# Patient Record
Sex: Female | Born: 1945 | Race: White | Hispanic: No | Marital: Married | Smoking: Former smoker
Health system: Southern US, Community
[De-identification: ages and names within clinical notes are randomized; demographics above are authoritative.]

## PROBLEM LIST (undated history)

## (undated) DIAGNOSIS — E785 Hyperlipidemia, unspecified: Secondary | ICD-10-CM

## (undated) DIAGNOSIS — I10 Essential (primary) hypertension: Secondary | ICD-10-CM

## (undated) DIAGNOSIS — E079 Disorder of thyroid, unspecified: Secondary | ICD-10-CM

## (undated) HISTORY — DX: Disorder of thyroid, unspecified: E07.9

## (undated) HISTORY — DX: Essential (primary) hypertension: I10

## (undated) HISTORY — DX: Hyperlipidemia, unspecified: E78.5

---

## 2000-05-31 ENCOUNTER — Other Ambulatory Visit: Admission: RE | Admit: 2000-05-31 | Discharge: 2000-05-31 | Payer: Self-pay | Admitting: Obstetrics and Gynecology

## 2003-01-12 ENCOUNTER — Other Ambulatory Visit: Admission: RE | Admit: 2003-01-12 | Discharge: 2003-01-12 | Payer: Self-pay | Admitting: Obstetrics and Gynecology

## 2013-08-13 DIAGNOSIS — J301 Allergic rhinitis due to pollen: Secondary | ICD-10-CM | POA: Diagnosis not present

## 2013-08-13 DIAGNOSIS — H9319 Tinnitus, unspecified ear: Secondary | ICD-10-CM | POA: Diagnosis not present

## 2013-09-23 ENCOUNTER — Encounter: Payer: Self-pay | Admitting: Podiatry

## 2013-09-23 ENCOUNTER — Ambulatory Visit: Payer: Medicare Other | Admitting: Podiatry

## 2013-09-23 VITALS — BP 166/82 | HR 66 | Resp 16 | Ht 66.0 in | Wt 184.0 lb

## 2013-09-23 DIAGNOSIS — L600 Ingrowing nail: Secondary | ICD-10-CM | POA: Diagnosis not present

## 2013-09-23 DIAGNOSIS — M79609 Pain in unspecified limb: Secondary | ICD-10-CM

## 2013-09-23 NOTE — Patient Instructions (Signed)

## 2013-09-23 NOTE — Progress Notes (Signed)
   Subjective:    Patient ID: Paige [name], female    DOB: [birth year], 68 y.o.   MRN: [id number]  HPI Comments: Its my big toenail on my left foot. Ive had it for a couple of months. Its getting worse. Enclosed shoes hurt. i have been trying to get the nail out.     Review of Systems  HENT: Positive for sinus pressure.        Ringing in ears  Endocrine: Positive for heat intolerance.  All other systems reviewed and are negative.      Objective:   Physical Exam        Assessment & Plan:

## 2013-09-23 NOTE — Progress Notes (Signed)
Subjective:     Patient ID: Paige [name], female   DOB: 09/17/1945, 68 y.o.   MRN: [id number]  HPI patient points to the left big toe lateral side stating that it has been sore and she cannot get the corner out   Review of Systems  All other systems reviewed and are negative.      Objective:   Physical Exam  Nursing note and vitals reviewed. Constitutional: She is oriented to person, place, and time.  Cardiovascular: Intact distal pulses.   Musculoskeletal: Normal range of motion.  Neurological: She is oriented to person, place, and time.  Skin: Skin is warm.   neurovascular status is intact with no changes in health history and patient's range of motion subtalar midtarsal joint within normal limits. Patient is found to have well-perfused digits and good arch height and I noted on the left hallux lateral border is incurvated and sore      Assessment:      ingrown toenail deformity left hallux lateral border     Plan:     H&P reviewed and today I recommended correction explaining risk. Infiltrated the left hallux 60 mg Xylocaine Marcaine mixture and remove the lateral border exposing the matrix and applying chemical phenol 3 applications 30 seconds followed by alcohol lavaged and sterile dressing. Gait instructions on soaks

## 2014-01-20 DIAGNOSIS — I10 Essential (primary) hypertension: Secondary | ICD-10-CM | POA: Diagnosis not present

## 2014-01-20 DIAGNOSIS — J309 Allergic rhinitis, unspecified: Secondary | ICD-10-CM | POA: Diagnosis not present

## 2014-01-20 DIAGNOSIS — Z23 Encounter for immunization: Secondary | ICD-10-CM | POA: Diagnosis not present

## 2014-01-20 DIAGNOSIS — E039 Hypothyroidism, unspecified: Secondary | ICD-10-CM | POA: Diagnosis not present

## 2014-01-20 DIAGNOSIS — E785 Hyperlipidemia, unspecified: Secondary | ICD-10-CM | POA: Diagnosis not present

## 2014-04-05 DIAGNOSIS — R0982 Postnasal drip: Secondary | ICD-10-CM | POA: Diagnosis not present

## 2014-04-05 DIAGNOSIS — J302 Other seasonal allergic rhinitis: Secondary | ICD-10-CM | POA: Diagnosis not present

## 2014-04-05 DIAGNOSIS — H1012 Acute atopic conjunctivitis, left eye: Secondary | ICD-10-CM | POA: Diagnosis not present

## 2014-06-23 DIAGNOSIS — E785 Hyperlipidemia, unspecified: Secondary | ICD-10-CM | POA: Diagnosis not present

## 2014-06-23 DIAGNOSIS — E039 Hypothyroidism, unspecified: Secondary | ICD-10-CM | POA: Diagnosis not present

## 2014-06-23 DIAGNOSIS — I10 Essential (primary) hypertension: Secondary | ICD-10-CM | POA: Diagnosis not present

## 2014-07-16 DIAGNOSIS — E039 Hypothyroidism, unspecified: Secondary | ICD-10-CM | POA: Diagnosis not present

## 2014-10-13 ENCOUNTER — Encounter: Payer: Self-pay | Admitting: Family Medicine

## 2014-10-13 ENCOUNTER — Ambulatory Visit (INDEPENDENT_AMBULATORY_CARE_PROVIDER_SITE_OTHER): Payer: Medicare Other | Admitting: Family Medicine

## 2014-10-13 ENCOUNTER — Encounter (INDEPENDENT_AMBULATORY_CARE_PROVIDER_SITE_OTHER): Payer: Self-pay

## 2014-10-13 VITALS — BP 110/80 | HR 78 | Resp 16 | Ht 65.0 in | Wt 187.0 lb

## 2014-10-13 DIAGNOSIS — E039 Hypothyroidism, unspecified: Secondary | ICD-10-CM

## 2014-10-13 MED ORDER — LEVOTHYROXINE SODIUM 88 MCG PO TABS: 88.0000 ug | ORAL_TABLET | Freq: Every day | ORAL | Status: DC

## 2014-10-13 NOTE — Progress Notes (Signed)
Name: Paige [name]   MRN: [id number]    DOB: 23-Mar-1946   Date:10/13/2014       Progress Note  Subjective  Chief Complaint  Chief Complaint  Patient presents with  . Establish Care    transfer From Dr. Jacqualine Code    Thyroid Problem Presents for follow-up visit. The condition has lasted for 24 years. Symptoms include anxiety, depressed mood and fatigue. Patient reports no constipation, dry skin, leg swelling, palpitations or weight gain. The symptoms have been stable. Past treatments include levothyroxine. The treatment provided moderate relief. Her past medical history is significant for hyperlipidemia. There is no history of diabetes.      Past Medical History  Diagnosis Date  . Hypertension   . Hyperlipidemia   . Thyroid disease     History reviewed. No pertinent past surgical history.  Family History  Problem Relation Age of Onset  . Cancer Mother   . Diabetes Father   . Stroke Father     History   Social History  . Marital Status: Married    Spouse Name: N/A  . Number of Children: N/A  . Years of Education: N/A   Occupational History  . Not on file.   Social History Main Topics  . Smoking status: Former Smoker    Quit date: 05/01/1977  . Smokeless tobacco: Never Used  . Alcohol Use: No  . Drug Use: No  . Sexual Activity: Not on file   Other Topics Concern  . Not on file   Social History Narrative     Current outpatient prescriptions:  .  aspirin 81 MG tablet, Take 1 tablet by mouth daily., Disp: , Rfl:  .  Calcium-Phosphorus-Vitamin D 100-50-100 MG-MG-UNIT CHEW, Chew 1 tablet by mouth., Disp: , Rfl:  .  fluticasone (FLONASE) 50 MCG/ACT nasal spray, Place into the nose daily., Disp: , Rfl:  .  levothyroxine (SYNTHROID, LEVOTHROID) 88 MCG tablet, Take 1 tablet by mouth daily., Disp: , Rfl:  .  losartan (COZAAR) 25 MG tablet, Take 1 tablet by mouth daily., Disp: , Rfl:  .  MULTIPLE VITAMIN PO, Take by mouth daily., Disp: , Rfl:  .  Omega-3 Fatty  Acids (FISH OIL BURP-LESS) 1200 MG CAPS, Take 1 capsule by mouth., Disp: , Rfl:  .  pravastatin (PRAVACHOL) 20 MG tablet, Take 1 tablet by mouth daily., Disp: , Rfl:   Allergies  Allergen Reactions  . Codeine Nausea And Vomiting     Review of Systems  Constitutional: Positive for fatigue. Negative for weight gain.  Cardiovascular: Negative for palpitations.  Gastrointestinal: Negative for constipation.  Psychiatric/Behavioral: Positive for depression. The patient is nervous/anxious and has insomnia.       Objective  Filed Vitals:   10/13/14 1550  BP: 110/80  Pulse: 78  Resp: 16  Height: 5\' 5"  (1.651 m)  Weight: 187 lb (84.823 kg)  SpO2: 97%    Physical Exam  Constitutional: She is oriented to person, place, and time and well-developed, well-nourished, and in no distress.  HENT:  Head: Normocephalic and atraumatic.  Neck: No thyroid mass and no thyromegaly present.  Cardiovascular: Normal rate and regular rhythm.   Pulmonary/Chest: Effort normal and breath sounds normal.  Musculoskeletal:       Right ankle: She exhibits no swelling.       Left ankle: She exhibits no swelling.  Neurological: She is alert and oriented to person, place, and time.  Nursing note and vitals reviewed.      No results found  for this or any previous visit (from the past 2160 hour(s)).   Assessment & Plan 1. Hypothyroidism, unspecified hypothyroidism type  - levothyroxine (SYNTHROID, LEVOTHROID) 88 MCG tablet; Take 1 tablet (88 mcg total) by mouth daily before breakfast.  Dispense: 30 tablet; Refill: 0 - TSH - T4, free  There are no diagnoses linked to this encounter.  Coye Dawood [facility] 10/13/2014 4:05 PM

## 2014-10-14 ENCOUNTER — Telehealth: Payer: Self-pay | Admitting: Family Medicine

## 2014-10-14 LAB — TSH: TSH: 1.11 u[IU]/mL (ref 0.450–4.500)

## 2014-10-14 LAB — T4, FREE: FREE T4: 1.46 ng/dL (ref 0.82–1.77)

## 2014-10-14 NOTE — Telephone Encounter (Signed)
Patient needs refill on the pravastatine to be called into [facility]. Patient has an appt 10/27/14. Patient states she only needs a weeks worth.

## 2014-10-15 MED ORDER — PRAVASTATIN SODIUM 20 MG PO TABS: 20.0000 mg | ORAL_TABLET | Freq: Every day | ORAL | Status: DC

## 2014-10-15 NOTE — Telephone Encounter (Signed)
LMOM to inform pt of medication.

## 2014-10-27 ENCOUNTER — Encounter: Payer: Self-pay | Admitting: Family Medicine

## 2014-10-27 ENCOUNTER — Ambulatory Visit (INDEPENDENT_AMBULATORY_CARE_PROVIDER_SITE_OTHER): Payer: Medicare Other | Admitting: Family Medicine

## 2014-10-27 VITALS — BP 130/70 | HR 74 | Temp 98.8°F | Ht 65.0 in | Wt 189.0 lb

## 2014-10-27 DIAGNOSIS — F411 Generalized anxiety disorder: Secondary | ICD-10-CM | POA: Insufficient documentation

## 2014-10-27 DIAGNOSIS — J309 Allergic rhinitis, unspecified: Secondary | ICD-10-CM | POA: Insufficient documentation

## 2014-10-27 DIAGNOSIS — I10 Essential (primary) hypertension: Secondary | ICD-10-CM | POA: Insufficient documentation

## 2014-10-27 DIAGNOSIS — E039 Hypothyroidism, unspecified: Secondary | ICD-10-CM

## 2014-10-27 DIAGNOSIS — E785 Hyperlipidemia, unspecified: Secondary | ICD-10-CM | POA: Diagnosis not present

## 2014-10-27 DIAGNOSIS — F430 Acute stress reaction: Secondary | ICD-10-CM

## 2014-10-27 MED ORDER — LEVOTHYROXINE SODIUM 88 MCG PO TABS: 88.0000 ug | ORAL_TABLET | Freq: Every day | ORAL | Status: DC

## 2014-10-27 MED ORDER — CLONAZEPAM 0.5 MG PO TABS: 0.2500 mg | ORAL_TABLET | Freq: Two times a day (BID) | ORAL | Status: DC | PRN

## 2014-10-27 NOTE — Progress Notes (Signed)
Name: Paige [name]   MRN: [id number]    DOB: 05-11-1945   Date:10/27/2014       Progress Note  Subjective  Chief Complaint  Chief Complaint  Patient presents with  . Hyperlipidemia    refill  . Hypertension    refill  . Hypothyroidism    refill    Hypertension Associated symptoms include anxiety. Pertinent negatives include no palpitations or shortness of breath.  Anxiety Presents for initial visit. The problem has been unchanged. Symptoms include depressed mood, insomnia, irritability, nervous/anxious behavior and restlessness. Patient reports no palpitations, panic (history of panic attacks many years ago.) or shortness of breath. Symptoms occur occasionally. The severity of symptoms is mild. The quality of sleep is fair.   Past treatments include lifestyle changes. The treatment provided moderate relief.  Pt. Is here for symptoms of feeling blue, some crying episodes, and insomnia. She thinks that her memory is not as good as it once was. She has multiple stressors in her life.    Past Medical History  Diagnosis Date  . Hypertension   . Hyperlipidemia   . Thyroid disease     History reviewed. No pertinent past surgical history.  Family History  Problem Relation Age of Onset  . Cancer Mother   . Diabetes Father   . Stroke Father     History   Social History  . Marital Status: Married    Spouse Name: N/A  . Number of Children: N/A  . Years of Education: N/A   Occupational History  . Not on file.   Social History Main Topics  . Smoking status: Former Smoker    Quit date: 05/01/1977  . Smokeless tobacco: Never Used  . Alcohol Use: No  . Drug Use: No  . Sexual Activity: Not on file   Other Topics Concern  . Not on file   Social History Narrative     Current outpatient prescriptions:  .  aspirin 81 MG tablet, Take 1 tablet by mouth daily., Disp: , Rfl:  .  Calcium-Phosphorus-Vitamin D 100-50-100 MG-MG-UNIT CHEW, Chew 1 tablet by mouth., Disp: ,  Rfl:  .  fluticasone (FLONASE) 50 MCG/ACT nasal spray, Place into the nose daily., Disp: , Rfl:  .  levothyroxine (SYNTHROID, LEVOTHROID) 88 MCG tablet, Take 1 tablet (88 mcg total) by mouth daily before breakfast., Disp: 30 tablet, Rfl: 0 .  losartan (COZAAR) 25 MG tablet, Take 1 tablet by mouth daily., Disp: , Rfl:  .  MULTIPLE VITAMIN PO, Take by mouth daily., Disp: , Rfl:  .  Omega-3 Fatty Acids (FISH OIL BURP-LESS) 1200 MG CAPS, Take 1 capsule by mouth., Disp: , Rfl:  .  pravastatin (PRAVACHOL) 20 MG tablet, Take 1 tablet (20 mg total) by mouth daily., Disp: 7 tablet, Rfl: 0  Allergies  Allergen Reactions  . Codeine Nausea And Vomiting     Review of Systems  Constitutional: Positive for irritability.  Respiratory: Negative for shortness of breath.   Cardiovascular: Negative for palpitations.  Psychiatric/Behavioral: Positive for memory loss. Negative for depression and hallucinations. The patient is nervous/anxious and has insomnia.       Objective  Filed Vitals:   10/27/14 1158  BP: 130/70  Pulse: 74  Temp: 98.8 F (37.1 C)  TempSrc: Oral  Height: 5\' 5"  (1.651 m)  Weight: 189 lb (85.73 kg)  SpO2: 97%    Physical Exam  Constitutional: She is oriented to person, place, and time and well-developed, well-nourished, and in no distress.  HENT:  Head: Normocephalic and atraumatic.  Cardiovascular: Normal rate and regular rhythm.   Pulmonary/Chest: Effort normal and breath sounds normal.  Neurological: She is alert and oriented to person, place, and time.  Psychiatric: Memory, affect and judgment normal.  Nursing note and vitals reviewed.      Recent Results (from the past 2160 hour(s))  TSH     Status: None   Collection Time: 10/13/14  4:29 PM  Result Value Ref Range   TSH 1.110 0.450 - 4.500 uIU/mL  T4, free     Status: None   Collection Time: 10/13/14  4:29 PM  Result Value Ref Range   Free T4 1.46 0.82 - 1.77 ng/dL     Assessment & Plan 1.  Hypothyroidism, unspecified hypothyroidism type She is to continue on levothyroxine at the current dosage. TSH and free T4 were within normal limits. - levothyroxine (SYNTHROID, LEVOTHROID) 88 MCG tablet; Take 1 tablet (88 mcg total) by mouth daily before breakfast.  Dispense: 15 tablet; Refill: 0  2. Generalized anxiety disorder Symptoms consistent with generalized anxiety. We will start her on when necessary anxiolytic. Patient is educated on the potential adverse effects of Klonopin and asked to report any concerning symptoms to this provider. Patient verbalized understanding. Follow-up in one month. - clonazePAM (KLONOPIN) 0.5 MG tablet; Take 0.5 tablets (0.25 mg total) by mouth 2 (two) times daily as needed for anxiety.  Dispense: 60 tablet; Refill: 0  3. Hyperlipidemia We will repeat fasting lipid panel today. She is currently on pravastatin 20 mg at bedtime. - Lipid Profile    Jozie Wulf [facility] 10/27/2014 12:07 PM

## 2014-10-28 ENCOUNTER — Telehealth: Payer: Self-pay | Admitting: *Deleted

## 2014-10-28 ENCOUNTER — Other Ambulatory Visit: Payer: Self-pay | Admitting: *Deleted

## 2014-10-28 ENCOUNTER — Other Ambulatory Visit: Payer: Self-pay | Admitting: Family Medicine

## 2014-10-28 DIAGNOSIS — E7800 Pure hypercholesterolemia, unspecified: Secondary | ICD-10-CM

## 2014-10-28 LAB — LIPID PANEL
CHOLESTEROL TOTAL: 195 mg/dL (ref 100–199)
Chol/HDL Ratio: 4.2 ratio units (ref 0.0–4.4)
HDL: 46 mg/dL (ref >39)
LDL Calculated: 116 mg/dL — ABNORMAL HIGH (ref 0–99)
Triglycerides: 167 mg/dL — ABNORMAL HIGH (ref 0–149)
VLDL Cholesterol Cal: 33 mg/dL (ref 5–40)

## 2014-10-28 MED ORDER — PRAVASTATIN SODIUM 40 MG PO TABS: 40.0000 mg | ORAL_TABLET | Freq: Every day | ORAL | Status: DC

## 2014-10-28 NOTE — Telephone Encounter (Signed)
Pt advised and script sent to Dr for signage

## 2014-10-28 NOTE — Telephone Encounter (Signed)
-----   Message from Roselee Nova, MD sent at 10/28/2014 12:54 PM EDT ----- Fasting lipid panel shows a normal total cholesterol at 195, elevated triglycerides at 167, normal HDL at 46, and an elevated LDL at 116. She is currently on pravastatin 20 mg at bedtime and should increase the dosage to 40 mg at bedtime. Repeat fasting lipid panel in 4 months

## 2014-10-28 NOTE — Telephone Encounter (Signed)
Result note to double Pravastatin.

## 2014-11-10 ENCOUNTER — Encounter: Payer: Self-pay | Admitting: Family Medicine

## 2014-11-10 ENCOUNTER — Ambulatory Visit (INDEPENDENT_AMBULATORY_CARE_PROVIDER_SITE_OTHER): Payer: Medicare Other | Admitting: Family Medicine

## 2014-11-10 VITALS — BP 120/71 | HR 67 | Temp 98.3°F | Resp 18 | Ht 65.0 in | Wt 185.4 lb

## 2014-11-10 DIAGNOSIS — Z1211 Encounter for screening for malignant neoplasm of colon: Secondary | ICD-10-CM | POA: Diagnosis not present

## 2014-11-10 DIAGNOSIS — Z1239 Encounter for other screening for malignant neoplasm of breast: Secondary | ICD-10-CM

## 2014-11-10 DIAGNOSIS — Z0000 Encounter for general adult medical examination without abnormal findings: Secondary | ICD-10-CM | POA: Insufficient documentation

## 2014-11-10 DIAGNOSIS — E785 Hyperlipidemia, unspecified: Secondary | ICD-10-CM | POA: Diagnosis not present

## 2014-11-10 NOTE — Progress Notes (Signed)
Name: Paige [name]   MRN: [id number]    DOB: [birth year]   Date:11/10/2014       Progress Note  Subjective  Chief Complaint  Chief Complaint  Patient presents with  . Follow-up    CPE    HPI Pt. Is here for a Complete Physical Exam.   Past Medical History  Diagnosis Date  . Hypertension   . Hyperlipidemia   . Thyroid disease     History reviewed. No pertinent past surgical history.  Family History  Problem Relation Age of Onset  . Cancer Mother   . Diabetes Father   . Stroke Father     History   Social History  . Marital Status: Married    Spouse Name: N/A  . Number of Children: N/A  . Years of Education: N/A   Occupational History  . Not on file.   Social History Main Topics  . Smoking status: Former Smoker    Quit date: 05/01/1977  . Smokeless tobacco: Never Used  . Alcohol Use: No  . Drug Use: No  . Sexual Activity: Not on file   Other Topics Concern  . Not on file   Social History Narrative     Current outpatient prescriptions:  .  aspirin 81 MG tablet, Take 1 tablet by mouth daily., Disp: , Rfl:  .  Calcium-Phosphorus-Vitamin D 100-50-100 MG-MG-UNIT CHEW, Chew 1 tablet by mouth., Disp: , Rfl:  .  clonazePAM (KLONOPIN) 0.5 MG tablet, Take 0.5 tablets (0.25 mg total) by mouth 2 (two) times daily as needed for anxiety., Disp: 60 tablet, Rfl: 0 .  fluticasone (FLONASE) 50 MCG/ACT nasal spray, Place into the nose daily., Disp: , Rfl:  .  levothyroxine (SYNTHROID, LEVOTHROID) 88 MCG tablet, Take 1 tablet (88 mcg total) by mouth daily before breakfast., Disp: 15 tablet, Rfl: 0 .  losartan (COZAAR) 25 MG tablet, Take 1 tablet by mouth daily., Disp: , Rfl:  .  MULTIPLE VITAMIN PO, Take by mouth daily., Disp: , Rfl:  .  Omega-3 Fatty Acids (FISH OIL BURP-LESS) 1200 MG CAPS, Take 1 capsule by mouth., Disp: , Rfl:  .  pravastatin (PRAVACHOL) 40 MG tablet, Take 1 tablet (40 mg total) by mouth daily., Disp: 90 tablet, Rfl: 1  Allergies  Allergen  Reactions  . Codeine Nausea And Vomiting     Review of Systems  Constitutional: Negative for fever, chills and weight loss.  HENT: Negative for ear pain and sore throat.   Eyes: Negative for pain.  Respiratory: Negative for cough and sputum production.   Gastrointestinal: Negative for heartburn, nausea, vomiting, abdominal pain, constipation and blood in stool.  Genitourinary: Negative for dysuria, frequency and hematuria.  Musculoskeletal: Negative for myalgias, back pain and joint pain.  Skin: Negative for itching and rash.  Neurological: Negative for dizziness and weakness.  Endo/Heme/Allergies: Does not bruise/bleed easily.  Psychiatric/Behavioral: Negative for depression and memory loss. The patient is nervous/anxious and has insomnia.       Objective  Filed Vitals:   11/10/14 1157  BP: 120/71  Pulse: 67  Temp: 98.3 F (36.8 C)  TempSrc: Oral  Resp: 18  Height: 5\' 5"  (1.651 m)  Weight: 185 lb 6.4 oz (84.097 kg)  SpO2: 97%    Physical Exam  Constitutional: She is oriented to person, place, and time and well-developed, well-nourished, and in no distress.  HENT:  Head: Normocephalic and atraumatic.  Eyes: Pupils are equal, round, and reactive to light.  Neck: Normal range of motion.  Cardiovascular: Normal rate and regular rhythm.   Pulmonary/Chest: Effort normal and breath sounds normal.  Genitourinary:  Deferred. Patient will follow-up with gynecology  Neurological: She is alert and oriented to person, place, and time.  Skin: Skin is warm and dry.  Psychiatric: Affect and judgment normal.  Nursing note and vitals reviewed.   Assessment & Plan 1. Colon cancer screening Pt. is not interested in colonoscopy at this time. She was provided information about Cologuard and will send a stool sample for analysis. - Cologuard  2. Screening for breast cancer Patient will follow-up with gynecology to obtain a referral for mammogram.  3. Hyperlipidemia  -  Comprehensive metabolic panel   Jondavid Schreier [facility] 11/10/2014 12:10 PM

## 2014-11-11 LAB — COMPREHENSIVE METABOLIC PANEL
ALBUMIN: 4.6 g/dL (ref 3.6–4.8)
ALT: 18 IU/L (ref 0–32)
AST: 22 IU/L (ref 0–40)
Albumin/Globulin Ratio: 1.7 (ref 1.1–2.5)
Alkaline Phosphatase: 60 IU/L (ref 39–117)
BUN/Creatinine Ratio: 20 (ref 11–26)
BUN: 19 mg/dL (ref 8–27)
Bilirubin Total: 0.5 mg/dL (ref 0.0–1.2)
CO2: 26 mmol/L (ref 18–29)
CREATININE: 0.94 mg/dL (ref 0.57–1.00)
Calcium: 9.7 mg/dL (ref 8.7–10.3)
Chloride: 103 mmol/L (ref 97–108)
GFR calc Af Amer: 73 mL/min/{1.73_m2} (ref >59)
GFR, EST NON AFRICAN AMERICAN: 63 mL/min/{1.73_m2} (ref >59)
GLOBULIN, TOTAL: 2.7 g/dL (ref 1.5–4.5)
Glucose: 90 mg/dL (ref 65–99)
Potassium: 4.3 mmol/L (ref 3.5–5.2)
SODIUM: 144 mmol/L (ref 134–144)
TOTAL PROTEIN: 7.3 g/dL (ref 6.0–8.5)

## 2014-11-19 DIAGNOSIS — Z1231 Encounter for screening mammogram for malignant neoplasm of breast: Secondary | ICD-10-CM | POA: Diagnosis not present

## 2014-12-29 ENCOUNTER — Encounter: Payer: Self-pay | Admitting: Family Medicine

## 2015-01-19 DIAGNOSIS — F4323 Adjustment disorder with mixed anxiety and depressed mood: Secondary | ICD-10-CM | POA: Diagnosis not present

## 2015-01-26 DIAGNOSIS — F4323 Adjustment disorder with mixed anxiety and depressed mood: Secondary | ICD-10-CM | POA: Diagnosis not present

## 2015-02-02 DIAGNOSIS — F4323 Adjustment disorder with mixed anxiety and depressed mood: Secondary | ICD-10-CM | POA: Diagnosis not present

## 2015-02-09 DIAGNOSIS — F4323 Adjustment disorder with mixed anxiety and depressed mood: Secondary | ICD-10-CM | POA: Diagnosis not present

## 2015-02-17 DIAGNOSIS — F4323 Adjustment disorder with mixed anxiety and depressed mood: Secondary | ICD-10-CM | POA: Diagnosis not present

## 2015-02-23 DIAGNOSIS — F4323 Adjustment disorder with mixed anxiety and depressed mood: Secondary | ICD-10-CM | POA: Diagnosis not present

## 2015-03-03 DIAGNOSIS — F4323 Adjustment disorder with mixed anxiety and depressed mood: Secondary | ICD-10-CM | POA: Diagnosis not present

## 2015-03-10 ENCOUNTER — Other Ambulatory Visit: Payer: Self-pay | Admitting: Family Medicine

## 2015-03-10 NOTE — Telephone Encounter (Signed)
Patient has medication refill appointment scheduled for 03/16/2015 and medication will be refilled then she has been notified and she states that she has enough to last until next scheduled appointment

## 2015-03-11 DIAGNOSIS — F4323 Adjustment disorder with mixed anxiety and depressed mood: Secondary | ICD-10-CM | POA: Diagnosis not present

## 2015-03-15 DIAGNOSIS — M3501 Sicca syndrome with keratoconjunctivitis: Secondary | ICD-10-CM | POA: Diagnosis not present

## 2015-03-16 ENCOUNTER — Ambulatory Visit (INDEPENDENT_AMBULATORY_CARE_PROVIDER_SITE_OTHER): Payer: Medicare Other | Admitting: Family Medicine

## 2015-03-16 ENCOUNTER — Encounter: Payer: Self-pay | Admitting: Family Medicine

## 2015-03-16 VITALS — BP 122/70 | HR 72 | Temp 98.8°F | Resp 17 | Ht 65.0 in | Wt 172.1 lb

## 2015-03-16 DIAGNOSIS — I10 Essential (primary) hypertension: Secondary | ICD-10-CM

## 2015-03-16 DIAGNOSIS — Z23 Encounter for immunization: Secondary | ICD-10-CM | POA: Diagnosis not present

## 2015-03-16 DIAGNOSIS — E039 Hypothyroidism, unspecified: Secondary | ICD-10-CM

## 2015-03-16 DIAGNOSIS — F411 Generalized anxiety disorder: Secondary | ICD-10-CM

## 2015-03-16 DIAGNOSIS — F419 Anxiety disorder, unspecified: Secondary | ICD-10-CM

## 2015-03-16 DIAGNOSIS — E785 Hyperlipidemia, unspecified: Secondary | ICD-10-CM | POA: Diagnosis not present

## 2015-03-16 DIAGNOSIS — F4323 Adjustment disorder with mixed anxiety and depressed mood: Secondary | ICD-10-CM | POA: Diagnosis not present

## 2015-03-16 DIAGNOSIS — F430 Acute stress reaction: Secondary | ICD-10-CM

## 2015-03-16 MED ORDER — PRAVASTATIN SODIUM 40 MG PO TABS: 40.0000 mg | ORAL_TABLET | Freq: Every day | ORAL | Status: DC

## 2015-03-16 MED ORDER — LOSARTAN POTASSIUM 25 MG PO TABS: 25.0000 mg | ORAL_TABLET | Freq: Every day | ORAL | Status: DC

## 2015-03-16 MED ORDER — LEVOTHYROXINE SODIUM 88 MCG PO TABS: 88.0000 ug | ORAL_TABLET | Freq: Every day | ORAL | Status: DC

## 2015-03-16 NOTE — Progress Notes (Signed)
Name: Paige [name]   MRN: [id number]    DOB: 14-Feb-1946   Date:03/16/2015       Progress Note  Subjective  Chief Complaint  Chief Complaint  Patient presents with  . Medication Refill    pravastatin 40mg  / levothyroxine 71mcg  . Hyperlipidemia  . Hypertension    Hyperlipidemia This is a chronic problem. The problem is uncontrolled. Recent lipid tests were reviewed and are high (Elevated LDL and TG). Pertinent negatives include no chest pain, leg pain or shortness of breath. Current antihyperlipidemic treatment includes statins.  Hypertension This is a chronic problem. The problem is unchanged. The problem is controlled. Associated symptoms include anxiety. Pertinent negatives include no blurred vision, chest pain, headaches, malaise/fatigue, palpitations or shortness of breath. Past treatments include angiotensin blockers. Hypertensive end-organ damage includes a thyroid problem.  Thyroid Problem Presents for follow-up visit. Patient reports no anxiety, cold intolerance, constipation, depressed mood, dry skin, fatigue, hair loss, palpitations or weight loss. The symptoms have been stable. Past treatments include levothyroxine. Her past medical history is significant for hyperlipidemia.  Anxiety Presents for follow-up visit. Patient reports no chest pain, depressed mood, insomnia, malaise, nervous/anxious behavior, palpitations or shortness of breath. Symptoms occur rarely. The severity of symptoms is mild (has significantly improved after she separated from her husband). The symptoms are aggravated by family issues. The quality of sleep is good.   There is no history of anxiety/panic attacks or depression. Past treatments include benzodiazephines.    Past Medical History  Diagnosis Date  . Hypertension   . Hyperlipidemia   . Thyroid disease     History reviewed. No pertinent past surgical history.  Family History  Problem Relation Age of Onset  . Cancer Mother   .  Diabetes Father   . Stroke Father     Social History   Social History  . Marital Status: Married    Spouse Name: N/A  . Number of Children: N/A  . Years of Education: N/A   Occupational History  . Not on file.   Social History Main Topics  . Smoking status: Former Smoker    Quit date: 05/01/1977  . Smokeless tobacco: Never Used  . Alcohol Use: No  . Drug Use: No  . Sexual Activity: Not on file   Other Topics Concern  . Not on file   Social History Narrative     Current outpatient prescriptions:  .  aspirin 81 MG tablet, Take 1 tablet by mouth daily., Disp: , Rfl:  .  Calcium-Phosphorus-Vitamin D 100-50-100 MG-MG-UNIT CHEW, Chew 1 tablet by mouth., Disp: , Rfl:  .  fluticasone (FLONASE) 50 MCG/ACT nasal spray, Place into the nose daily., Disp: , Rfl:  .  levothyroxine (SYNTHROID, LEVOTHROID) 88 MCG tablet, Take 1 tablet (88 mcg total) by mouth daily before breakfast., Disp: 15 tablet, Rfl: 0 .  losartan (COZAAR) 25 MG tablet, Take 1 tablet by mouth daily., Disp: , Rfl:  .  MULTIPLE VITAMIN PO, Take by mouth daily., Disp: , Rfl:  .  Omega-3 Fatty Acids (FISH OIL BURP-LESS) 1200 MG CAPS, Take 1 capsule by mouth., Disp: , Rfl:  .  pravastatin (PRAVACHOL) 40 MG tablet, Take 1 tablet (40 mg total) by mouth daily., Disp: 90 tablet, Rfl: 1 .  clonazePAM (KLONOPIN) 0.5 MG tablet, Take 0.5 tablets (0.25 mg total) by mouth 2 (two) times daily as needed for anxiety. (Patient not taking: Reported on 03/16/2015), Disp: 60 tablet, Rfl: 0  Allergies  Allergen Reactions  . Codeine  Nausea And Vomiting    Review of Systems  Constitutional: Negative for weight loss, malaise/fatigue and fatigue.  Eyes: Negative for blurred vision.  Respiratory: Negative for shortness of breath.   Cardiovascular: Negative for chest pain and palpitations.  Gastrointestinal: Negative for abdominal pain and constipation.  Neurological: Negative for headaches.  Endo/Heme/Allergies: Negative for cold  intolerance.  Psychiatric/Behavioral: Negative for depression. The patient is not nervous/anxious and does not have insomnia.     Objective  Filed Vitals:   03/16/15 0833  BP: 122/70  Pulse: 72  Temp: 98.8 F (37.1 C)  TempSrc: Oral  Resp: 17  Height: 5\' 5"  (1.651 m)  Weight: 172 lb 1.6 oz (78.064 kg)  SpO2: 96%    Physical Exam  Constitutional: She is oriented to person, place, and time and well-developed, well-nourished, and in no distress.  HENT:  Head: Normocephalic and atraumatic.  Eyes: Pupils are equal, round, and reactive to light.  Neck: Normal range of motion. Neck supple. No thyromegaly present.  Cardiovascular: Normal rate, regular rhythm and normal heart sounds.   No murmur heard. Pulmonary/Chest: Effort normal and breath sounds normal. She has no wheezes.  Abdominal: Soft. Bowel sounds are normal. She exhibits no distension.  Neurological: She is alert and oriented to person, place, and time.  Psychiatric: Mood, memory, affect and judgment normal.  Nursing note and vitals reviewed.    Assessment & Plan  1. Need for immunization against influenza  - Flu vaccine HIGH DOSE PF (Fluzone High dose)  2. Essential hypertension Stable. Refills provided - losartan (COZAAR) 25 MG tablet; Take 1 tablet (25 mg total) by mouth daily.  Dispense: 90 tablet; Refill: 0  3. Hypothyroidism, unspecified hypothyroidism type Stable. Refills provided. - levothyroxine (SYNTHROID, LEVOTHROID) 88 MCG tablet; Take 1 tablet (88 mcg total) by mouth daily before breakfast.  Dispense: 90 tablet; Refill: 0  4. Anxiety in acute stress reaction Significantly improved and she is no longer taking clonazepam.  5. Hyperlipidemia Elevated triglycerides and LDL in June 2016. Now on pravastatin 40 mg at bedtime. Recheck and follow-up. - Lipid Profile - pravastatin (PRAVACHOL) 40 MG tablet; Take 1 tablet (40 mg total) by mouth daily.  Dispense: 90 tablet; Refill: 0    Haliyah Fryman Asad A.  [facility] 03/16/2015 8:49 AM

## 2015-03-17 LAB — LIPID PANEL
CHOLESTEROL TOTAL: 152 mg/dL (ref 100–199)
Chol/HDL Ratio: 3 ratio units (ref 0.0–4.4)
HDL: 50 mg/dL (ref >39)
LDL Calculated: 85 mg/dL (ref 0–99)
Triglycerides: 85 mg/dL (ref 0–149)
VLDL CHOLESTEROL CAL: 17 mg/dL (ref 5–40)

## 2015-03-24 DIAGNOSIS — F4323 Adjustment disorder with mixed anxiety and depressed mood: Secondary | ICD-10-CM | POA: Diagnosis not present

## 2015-03-30 DIAGNOSIS — F4323 Adjustment disorder with mixed anxiety and depressed mood: Secondary | ICD-10-CM | POA: Diagnosis not present

## 2015-04-07 DIAGNOSIS — F4323 Adjustment disorder with mixed anxiety and depressed mood: Secondary | ICD-10-CM | POA: Diagnosis not present

## 2015-04-15 DIAGNOSIS — F4323 Adjustment disorder with mixed anxiety and depressed mood: Secondary | ICD-10-CM | POA: Diagnosis not present

## 2015-04-28 DIAGNOSIS — F4323 Adjustment disorder with mixed anxiety and depressed mood: Secondary | ICD-10-CM | POA: Diagnosis not present

## 2015-05-12 ENCOUNTER — Other Ambulatory Visit: Payer: Self-pay | Admitting: Family Medicine

## 2015-05-13 DIAGNOSIS — F4323 Adjustment disorder with mixed anxiety and depressed mood: Secondary | ICD-10-CM | POA: Diagnosis not present

## 2015-05-14 ENCOUNTER — Ambulatory Visit: Payer: Medicare Other | Admitting: Family Medicine

## 2015-07-14 ENCOUNTER — Other Ambulatory Visit: Payer: Self-pay | Admitting: Family Medicine

## 2015-07-14 DIAGNOSIS — F4323 Adjustment disorder with mixed anxiety and depressed mood: Secondary | ICD-10-CM | POA: Diagnosis not present

## 2015-08-11 DIAGNOSIS — F4323 Adjustment disorder with mixed anxiety and depressed mood: Secondary | ICD-10-CM | POA: Diagnosis not present

## 2015-08-13 ENCOUNTER — Ambulatory Visit: Payer: Medicare Other | Admitting: Family Medicine

## 2015-08-16 ENCOUNTER — Encounter: Payer: Self-pay | Admitting: Family Medicine

## 2015-08-16 ENCOUNTER — Ambulatory Visit (INDEPENDENT_AMBULATORY_CARE_PROVIDER_SITE_OTHER): Payer: Medicare Other | Admitting: Family Medicine

## 2015-08-16 VITALS — BP 120/77 | HR 70 | Temp 99.1°F | Resp 16 | Ht 65.0 in | Wt 178.3 lb

## 2015-08-16 DIAGNOSIS — I10 Essential (primary) hypertension: Secondary | ICD-10-CM

## 2015-08-16 NOTE — Progress Notes (Signed)
Name: Paige [name]   MRN: [id number]    DOB: 09-Aug-1945   Date:08/16/2015       Progress Note  Subjective  Chief Complaint  Chief Complaint  Patient presents with  . Follow-up    4 mo    HPI  Hypertension: Pt. Presents for BP follow up. She reports that BP of 120/77 is a little low for her. No symptoms reported.  Past Medical History  Diagnosis Date  . Hypertension   . Hyperlipidemia   . Thyroid disease     History reviewed. No pertinent past surgical history.  Family History  Problem Relation Age of Onset  . Cancer Mother   . Diabetes Father   . Stroke Father     Social History   Social History  . Marital Status: Married    Spouse Name: N/A  . Number of Children: N/A  . Years of Education: N/A   Occupational History  . Not on file.   Social History Main Topics  . Smoking status: Former Smoker    Quit date: 05/01/1977  . Smokeless tobacco: Never Used  . Alcohol Use: No  . Drug Use: No  . Sexual Activity: Not on file   Other Topics Concern  . Not on file   Social History Narrative     Current outpatient prescriptions:  .  aspirin 81 MG tablet, Take 1 tablet by mouth daily., Disp: , Rfl:  .  Calcium-Phosphorus-Vitamin D 100-50-100 MG-MG-UNIT CHEW, Chew 1 tablet by mouth., Disp: , Rfl:  .  clonazePAM (KLONOPIN) 0.5 MG tablet, Take 0.5 tablets (0.25 mg total) by mouth 2 (two) times daily as needed for anxiety., Disp: 60 tablet, Rfl: 0 .  fluticasone (FLONASE) 50 MCG/ACT nasal spray, Place into the nose daily., Disp: , Rfl:  .  levothyroxine (SYNTHROID, LEVOTHROID) 88 MCG tablet, TAKE 1 TABLET DAILY BEFORE BREAKFAST , Disp: 90 tablet, Rfl: 0 .  losartan (COZAAR) 25 MG tablet, TAKE 1 TABLET EVERY DAY, Disp: 90 tablet, Rfl: 0 .  MULTIPLE VITAMIN PO, Take by mouth daily., Disp: , Rfl:  .  Omega-3 Fatty Acids (FISH OIL BURP-LESS) 1200 MG CAPS, Take 1 capsule by mouth., Disp: , Rfl:  .  pravastatin (PRAVACHOL) 40 MG tablet, TAKE 1 TABLET EVERY DAY,  Disp: 90 tablet, Rfl: 0  Allergies  Allergen Reactions  . Codeine Nausea And Vomiting     Review of Systems  Eyes: Negative for blurred vision.  Respiratory: Negative for cough.   Cardiovascular: Negative for chest pain and palpitations.  Neurological: Negative for headaches.     Objective  Filed Vitals:   08/16/15 0922  BP: 120/77  Pulse: 70  Temp: 99.1 F (37.3 C)  TempSrc: Oral  Resp: 16  Height: 5\' 5"  (1.651 m)  Weight: 178 lb 4.8 oz (80.876 kg)  SpO2: 97%    Physical Exam  Constitutional: She is oriented to person, place, and time and well-developed, well-nourished, and in no distress.  HENT:  Head: Normocephalic and atraumatic.  Cardiovascular: Normal rate and regular rhythm.   Pulmonary/Chest: Effort normal and breath sounds normal.  Neurological: She is alert and oriented to person, place, and time.  Nursing note and vitals reviewed.     Assessment & Plan  1. Essential hypertension Blood pressure is at goal, patient on low-dose Losartan. Advised to increase fluid intake for lower than usual blood pressure. Follow-up in 3-4 months   Marchele Decock Asad A. [facility] 08/16/2015 9:37 AM

## 2015-08-24 DIAGNOSIS — F4323 Adjustment disorder with mixed anxiety and depressed mood: Secondary | ICD-10-CM | POA: Diagnosis not present

## 2015-09-07 DIAGNOSIS — F4323 Adjustment disorder with mixed anxiety and depressed mood: Secondary | ICD-10-CM | POA: Diagnosis not present

## 2015-09-21 DIAGNOSIS — F4323 Adjustment disorder with mixed anxiety and depressed mood: Secondary | ICD-10-CM | POA: Diagnosis not present

## 2015-10-05 DIAGNOSIS — F4323 Adjustment disorder with mixed anxiety and depressed mood: Secondary | ICD-10-CM | POA: Diagnosis not present

## 2015-10-19 DIAGNOSIS — F4323 Adjustment disorder with mixed anxiety and depressed mood: Secondary | ICD-10-CM | POA: Diagnosis not present

## 2015-11-03 ENCOUNTER — Ambulatory Visit: Payer: Medicare Other | Admitting: Family Medicine

## 2015-11-03 DIAGNOSIS — F4323 Adjustment disorder with mixed anxiety and depressed mood: Secondary | ICD-10-CM | POA: Diagnosis not present

## 2015-11-10 ENCOUNTER — Encounter: Payer: Self-pay | Admitting: Family Medicine

## 2015-11-10 ENCOUNTER — Ambulatory Visit (INDEPENDENT_AMBULATORY_CARE_PROVIDER_SITE_OTHER): Payer: Medicare Other | Admitting: Family Medicine

## 2015-11-10 VITALS — BP 121/63 | HR 68 | Temp 98.9°F | Resp 15 | Ht 65.0 in | Wt 177.4 lb

## 2015-11-10 DIAGNOSIS — E039 Hypothyroidism, unspecified: Secondary | ICD-10-CM | POA: Diagnosis not present

## 2015-11-10 DIAGNOSIS — E785 Hyperlipidemia, unspecified: Secondary | ICD-10-CM | POA: Diagnosis not present

## 2015-11-10 DIAGNOSIS — I10 Essential (primary) hypertension: Secondary | ICD-10-CM

## 2015-11-10 MED ORDER — LOSARTAN POTASSIUM 25 MG PO TABS: 25.0000 mg | ORAL_TABLET | Freq: Every day | ORAL | Status: DC

## 2015-11-10 MED ORDER — LEVOTHYROXINE SODIUM 88 MCG PO TABS: 88.0000 ug | ORAL_TABLET | Freq: Every day | ORAL | Status: DC

## 2015-11-10 MED ORDER — PRAVASTATIN SODIUM 40 MG PO TABS: 40.0000 mg | ORAL_TABLET | Freq: Every day | ORAL | Status: DC

## 2015-11-10 NOTE — Progress Notes (Signed)
Name: Paige [name]   MRN: [id number]    DOB: [birth year]   Date:11/10/2015       Progress Note  Subjective  Chief Complaint  Chief Complaint  Patient presents with  . Follow-up    2 mo  . Labs Only  . Medication Refill    levothyroxine 88 mcg / losartan 25 mg / pravastatin     HPI  Hypothyroidism: Pt. Is on Synthroid 58mcg every morning, no fatigue, constipation, dry skin, or hair loss. Last TFTs were within range.  Hypertension:  Blood Pressure is stable, taking Losartan 25 mg daily, no history of CVD, no symptoms of chest pain, blurry vision, headache, or leg swelling.  Hyperlipidemia: FLP obtained in November 2016 was at goal, takes Pravastatin 40 mg daily, no side effects including myalgias. Liver enzymes within range.    Past Medical History  Diagnosis Date  . Hypertension   . Hyperlipidemia   . Thyroid disease     History reviewed. No pertinent past surgical history.  Family History  Problem Relation Age of Onset  . Cancer Mother   . Diabetes Father   . Stroke Father     Social History   Social History  . Marital Status: Married    Spouse Name: N/A  . Number of Children: N/A  . Years of Education: N/A   Occupational History  . Not on file.   Social History Main Topics  . Smoking status: Former Smoker    Quit date: 05/01/1977  . Smokeless tobacco: Never Used  . Alcohol Use: No  . Drug Use: No  . Sexual Activity: Not on file   Other Topics Concern  . Not on file   Social History Narrative     Current outpatient prescriptions:  .  aspirin 81 MG tablet, Take 1 tablet by mouth daily., Disp: , Rfl:  .  Calcium-Phosphorus-Vitamin D 100-50-100 MG-MG-UNIT CHEW, Chew 1 tablet by mouth., Disp: , Rfl:  .  levothyroxine (SYNTHROID, LEVOTHROID) 88 MCG tablet, TAKE 1 TABLET DAILY BEFORE BREAKFAST , Disp: 90 tablet, Rfl: 0 .  losartan (COZAAR) 25 MG tablet, TAKE 1 TABLET EVERY DAY, Disp: 90 tablet, Rfl: 0 .  MULTIPLE VITAMIN PO, Take by mouth  daily., Disp: , Rfl:  .  Omega-3 Fatty Acids (FISH OIL BURP-LESS) 1200 MG CAPS, Take 1 capsule by mouth., Disp: , Rfl:  .  pravastatin (PRAVACHOL) 40 MG tablet, TAKE 1 TABLET EVERY DAY, Disp: 90 tablet, Rfl: 0  Allergies  Allergen Reactions  . Codeine Nausea And Vomiting    Review of Systems  Constitutional: Negative for malaise/fatigue.  Eyes: Negative for blurred vision.  Cardiovascular: Negative for chest pain.  Gastrointestinal: Negative for constipation.      Objective  Filed Vitals:   11/10/15 0818  BP: 121/63  Pulse: 68  Temp: 98.9 F (37.2 C)  TempSrc: Oral  Resp: 15  Height: 5\' 5"  (1.651 m)  Weight: 177 lb 6.4 oz (80.468 kg)  SpO2: 98%    Physical Exam  Constitutional: She is oriented to person, place, and time and well-developed, well-nourished, and in no distress.  HENT:  Head: Normocephalic and atraumatic.  Neck: No thyromegaly present.  Cardiovascular: Normal rate, regular rhythm, S1 normal, S2 normal and normal heart sounds.   No murmur heard. Pulmonary/Chest: Effort normal and breath sounds normal. She has no wheezes. She has no rhonchi.  Abdominal: Soft. Bowel sounds are normal. She exhibits no distension.  Musculoskeletal:       Right ankle: She  exhibits no swelling.       Left ankle: She exhibits no swelling.  Neurological: She is alert and oriented to person, place, and time.  Psychiatric: Mood, memory, affect and judgment normal.  Nursing note and vitals reviewed.    Assessment & Plan  1. Hypothyroidism, unspecified hypothyroidism type  - levothyroxine (SYNTHROID, LEVOTHROID) 88 MCG tablet; Take 1 tablet (88 mcg total) by mouth daily before breakfast.  Dispense: 90 tablet; Refill: 1 - TSH  2. Hyperlipidemia  - pravastatin (PRAVACHOL) 40 MG tablet; Take 1 tablet (40 mg total) by mouth daily.  Dispense: 90 tablet; Refill: 1 - Lipid Profile - Comprehensive Metabolic Panel (CMET)  3. Essential hypertension  - losartan (COZAAR) 25 MG  tablet; Take 1 tablet (25 mg total) by mouth daily.  Dispense: 90 tablet; Refill: 1   Zion Lint Asad A. [facility] 11/10/2015 8:34 AM

## 2015-11-11 LAB — COMPREHENSIVE METABOLIC PANEL
ALBUMIN: 4.2 g/dL (ref 3.6–5.1)
ALK PHOS: 45 U/L (ref 33–130)
ALT: 24 U/L (ref 6–29)
AST: 31 U/L (ref 10–35)
BILIRUBIN TOTAL: 0.7 mg/dL (ref 0.2–1.2)
BUN: 17 mg/dL (ref 7–25)
CO2: 16 mmol/L — AB (ref 20–31)
CREATININE: 0.87 mg/dL (ref 0.50–0.99)
Calcium: 9 mg/dL (ref 8.6–10.4)
Chloride: 109 mmol/L (ref 98–110)
GLUCOSE: 94 mg/dL (ref 65–99)
Potassium: 5.2 mmol/L (ref 3.5–5.3)
SODIUM: 142 mmol/L (ref 135–146)
Total Protein: 7.3 g/dL (ref 6.1–8.1)

## 2015-11-11 LAB — LIPID PANEL
CHOL/HDL RATIO: 3.4 ratio (ref <=5.0)
CHOLESTEROL: 186 mg/dL (ref 125–200)
HDL: 54 mg/dL (ref >=46)
LDL Cholesterol: 109 mg/dL (ref <130)
Triglycerides: 117 mg/dL (ref <150)
VLDL: 23 mg/dL (ref <30)

## 2015-11-11 LAB — TSH: TSH: 1.37 mIU/L

## 2015-11-12 ENCOUNTER — Telehealth: Payer: Self-pay | Admitting: Emergency Medicine

## 2015-11-12 NOTE — Telephone Encounter (Signed)
Patient notified of labs.   

## 2015-11-30 DIAGNOSIS — F4323 Adjustment disorder with mixed anxiety and depressed mood: Secondary | ICD-10-CM | POA: Diagnosis not present

## 2015-12-15 DIAGNOSIS — F4323 Adjustment disorder with mixed anxiety and depressed mood: Secondary | ICD-10-CM | POA: Diagnosis not present

## 2015-12-30 DIAGNOSIS — Z1231 Encounter for screening mammogram for malignant neoplasm of breast: Secondary | ICD-10-CM | POA: Diagnosis not present

## 2015-12-30 LAB — HM MAMMOGRAPHY: HM Mammogram: NORMAL (ref 0–4)

## 2016-04-03 ENCOUNTER — Other Ambulatory Visit: Payer: Self-pay | Admitting: Family Medicine

## 2016-04-03 DIAGNOSIS — E039 Hypothyroidism, unspecified: Secondary | ICD-10-CM

## 2016-04-03 DIAGNOSIS — I10 Essential (primary) hypertension: Secondary | ICD-10-CM

## 2016-04-03 DIAGNOSIS — E785 Hyperlipidemia, unspecified: Secondary | ICD-10-CM

## 2016-05-15 ENCOUNTER — Ambulatory Visit: Payer: Medicare Other | Admitting: Family Medicine

## 2016-07-20 ENCOUNTER — Telehealth: Payer: Self-pay

## 2016-07-20 NOTE — Telephone Encounter (Signed)
Called pt to schedule AWV. Left message, ANR

## 2016-09-06 ENCOUNTER — Ambulatory Visit (INDEPENDENT_AMBULATORY_CARE_PROVIDER_SITE_OTHER): Payer: Medicare Other | Admitting: Family Medicine

## 2016-09-06 ENCOUNTER — Encounter: Payer: Self-pay | Admitting: Family Medicine

## 2016-09-06 DIAGNOSIS — E039 Hypothyroidism, unspecified: Secondary | ICD-10-CM

## 2016-09-06 DIAGNOSIS — I10 Essential (primary) hypertension: Secondary | ICD-10-CM

## 2016-09-06 DIAGNOSIS — E7800 Pure hypercholesterolemia, unspecified: Secondary | ICD-10-CM

## 2016-09-06 MED ORDER — LOSARTAN POTASSIUM 25 MG PO TABS: 25.0000 mg | ORAL_TABLET | Freq: Every day | ORAL | 1 refills | Status: DC

## 2016-09-06 MED ORDER — LEVOTHYROXINE SODIUM 88 MCG PO TABS: 88.0000 ug | ORAL_TABLET | Freq: Every day | ORAL | 1 refills | Status: DC

## 2016-09-06 MED ORDER — PRAVASTATIN SODIUM 40 MG PO TABS: 40.0000 mg | ORAL_TABLET | Freq: Every day | ORAL | 1 refills | Status: DC

## 2016-09-06 NOTE — Progress Notes (Signed)
Name: Paige [name]   MRN: [id number]    DOB: [birth year]   Date:09/06/2016       Progress Note  Subjective  Chief Complaint  Chief Complaint  Patient presents with  . Annual Exam    CPE    Hypertension  This is a chronic problem. The problem is unchanged. The problem is controlled. Pertinent negatives include no blurred vision, chest pain, headaches, orthopnea, palpitations or shortness of breath. Past treatments include angiotensin blockers. There is no history of kidney disease, CAD/MI or CVA. Identifiable causes of hypertension include a thyroid problem.  Hyperlipidemia  This is a chronic problem. The problem is uncontrolled. Recent lipid tests were reviewed and are high. Pertinent negatives include no chest pain, leg pain, myalgias or shortness of breath. Current antihyperlipidemic treatment includes statins.  Thyroid Problem  Presents for follow-up visit. Symptoms include hair loss. Patient reports no cold intolerance, constipation, depressed mood, dry skin, fatigue, leg swelling or palpitations. The symptoms have been stable. Her past medical history is significant for hyperlipidemia.    Past Medical History:  Diagnosis Date  . Hyperlipidemia   . Hypertension   . Thyroid disease     History reviewed. No pertinent surgical history.  Family History  Problem Relation Age of Onset  . Cancer Mother   . Diabetes Father   . Stroke Father     Social History   Social History  . Marital status: Married    Spouse name: N/A  . Number of children: N/A  . Years of education: N/A   Occupational History  . Not on file.   Social History Main Topics  . Smoking status: Former Smoker    Quit date: 05/01/1977  . Smokeless tobacco: Never Used  . Alcohol use No  . Drug use: No  . Sexual activity: Not on file   Other Topics Concern  . Not on file   Social History Narrative  . No narrative on file     Current Outpatient Prescriptions:  .  aspirin 81 MG tablet, Take 1  tablet by mouth daily., Disp: , Rfl:  .  Calcium-Phosphorus-Vitamin D 100-50-100 MG-MG-UNIT CHEW, Chew 1 tablet by mouth., Disp: , Rfl:  .  levothyroxine (SYNTHROID, LEVOTHROID) 88 MCG tablet, TAKE 1 TABLET (88 MCG TOTAL) BY MOUTH DAILY BEFORE BREAKFAST., Disp: 90 tablet, Rfl: 1 .  losartan (COZAAR) 25 MG tablet, TAKE 1 TABLET (25 MG TOTAL) BY MOUTH DAILY., Disp: 90 tablet, Rfl: 1 .  MULTIPLE VITAMIN PO, Take by mouth daily., Disp: , Rfl:  .  Omega-3 Fatty Acids (FISH OIL BURP-LESS) 1200 MG CAPS, Take 1 capsule by mouth., Disp: , Rfl:  .  pravastatin (PRAVACHOL) 40 MG tablet, TAKE 1 TABLET (40 MG TOTAL) BY MOUTH DAILY., Disp: 90 tablet, Rfl: 1  Allergies  Allergen Reactions  . Codeine Nausea And Vomiting     Review of Systems  Constitutional: Negative for fatigue.  Eyes: Negative for blurred vision.  Respiratory: Negative for shortness of breath.   Cardiovascular: Negative for chest pain, palpitations and orthopnea.  Gastrointestinal: Negative for constipation.  Musculoskeletal: Negative for myalgias.  Neurological: Negative for headaches.  Endo/Heme/Allergies: Negative for cold intolerance.      Objective  Vitals:   09/06/16 1059  BP: 123/74  Pulse: 78  Resp: 16  Temp: 98.6 F (37 C)  TempSrc: Oral  SpO2: 96%  Weight: 181 lb 11.2 oz (82.4 kg)  Height: 5\' 5"  (1.651 m)    Physical Exam  Constitutional: She is oriented  to person, place, and time and well-developed, well-nourished, and in no distress.  HENT:  Head: Normocephalic and atraumatic.  Neck: No thyromegaly present.  Cardiovascular: Normal rate, regular rhythm, S1 normal, S2 normal and normal heart sounds.   No murmur heard. Pulmonary/Chest: Effort normal and breath sounds normal. She has no wheezes. She has no rhonchi.  Abdominal: Soft. Bowel sounds are normal. She exhibits no distension.  Musculoskeletal:       Right ankle: She exhibits no swelling.       Left ankle: She exhibits no swelling.   Neurological: She is alert and oriented to person, place, and time.  Psychiatric: Mood, memory, affect and judgment normal.  Nursing note and vitals reviewed.    Assessment & Plan  1. Essential hypertension BP stable on present and hypertensive therapy - losartan (COZAAR) 25 MG tablet; Take 1 tablet (25 mg total) by mouth daily.  Dispense: 90 tablet; Refill: 1  2. Pure hypercholesterolemia  - pravastatin (PRAVACHOL) 40 MG tablet; Take 1 tablet (40 mg total) by mouth daily.  Dispense: 90 tablet; Refill: 1 - COMPLETE METABOLIC PANEL WITH GFR - Lipid panel  3. Hypothyroidism, unspecified type Hair loss unlikely to be due to thyroid, obtain TSH, continue on present dose of levothyroxine - levothyroxine (SYNTHROID, LEVOTHROID) 88 MCG tablet; Take 1 tablet (88 mcg total) by mouth daily before breakfast.  Dispense: 90 tablet; Refill: 1 - TSH   Frederich Montilla Asad A. [facility] 09/06/2016 11:21 AM

## 2016-09-07 ENCOUNTER — Other Ambulatory Visit: Payer: Self-pay | Admitting: Family Medicine

## 2016-09-07 DIAGNOSIS — E7800 Pure hypercholesterolemia, unspecified: Secondary | ICD-10-CM | POA: Diagnosis not present

## 2016-09-07 DIAGNOSIS — E039 Hypothyroidism, unspecified: Secondary | ICD-10-CM | POA: Diagnosis not present

## 2016-09-08 LAB — COMPREHENSIVE METABOLIC PANEL
ALT: 14 IU/L (ref 0–32)
AST: 17 IU/L (ref 0–40)
Albumin/Globulin Ratio: 1.6 (ref 1.2–2.2)
Albumin: 4.3 g/dL (ref 3.6–4.8)
Alkaline Phosphatase: 48 IU/L (ref 39–117)
BUN/Creatinine Ratio: 27 (ref 12–28)
BUN: 21 mg/dL (ref 8–27)
Bilirubin Total: 0.7 mg/dL (ref 0.0–1.2)
CALCIUM: 9 mg/dL (ref 8.7–10.3)
CO2: 25 mmol/L (ref 18–29)
Chloride: 104 mmol/L (ref 96–106)
Creatinine, Ser: 0.79 mg/dL (ref 0.57–1.00)
GFR, EST AFRICAN AMERICAN: 89 mL/min/{1.73_m2} (ref >59)
GFR, EST NON AFRICAN AMERICAN: 77 mL/min/{1.73_m2} (ref >59)
GLUCOSE: 99 mg/dL (ref 65–99)
Globulin, Total: 2.7 g/dL (ref 1.5–4.5)
POTASSIUM: 4 mmol/L (ref 3.5–5.2)
Sodium: 142 mmol/L (ref 134–144)
TOTAL PROTEIN: 7 g/dL (ref 6.0–8.5)

## 2016-09-08 LAB — LIPID PANEL WITH LDL/HDL RATIO
Cholesterol, Total: 149 mg/dL (ref 100–199)
HDL: 48 mg/dL (ref >39)
LDL Calculated: 82 mg/dL (ref 0–99)
LDL/HDL RATIO: 1.7 ratio (ref 0.0–3.2)
Triglycerides: 97 mg/dL (ref 0–149)
VLDL CHOLESTEROL CAL: 19 mg/dL (ref 5–40)

## 2016-09-08 LAB — TSH: TSH: 0.819 u[IU]/mL (ref 0.450–4.500)

## 2016-10-30 ENCOUNTER — Ambulatory Visit: Payer: Medicare Other

## 2016-11-20 ENCOUNTER — Other Ambulatory Visit: Payer: Self-pay

## 2016-11-20 ENCOUNTER — Ambulatory Visit (INDEPENDENT_AMBULATORY_CARE_PROVIDER_SITE_OTHER): Payer: Medicare Other

## 2016-11-20 VITALS — BP 122/64 | HR 68 | Temp 98.3°F | Ht 65.0 in | Wt 181.9 lb

## 2016-11-20 DIAGNOSIS — J3089 Other allergic rhinitis: Secondary | ICD-10-CM

## 2016-11-20 DIAGNOSIS — Z0000 Encounter for general adult medical examination without abnormal findings: Secondary | ICD-10-CM | POA: Diagnosis not present

## 2016-11-20 DIAGNOSIS — Z780 Asymptomatic menopausal state: Secondary | ICD-10-CM | POA: Diagnosis not present

## 2016-11-20 DIAGNOSIS — Z23 Encounter for immunization: Secondary | ICD-10-CM

## 2016-11-20 DIAGNOSIS — Z13820 Encounter for screening for osteoporosis: Secondary | ICD-10-CM | POA: Diagnosis not present

## 2016-11-20 NOTE — Progress Notes (Signed)
Subjective:   Paige [name] is a 71 y.o. female who presents for Medicare Annual (Subsequent) preventive examination.  Review of Systems:  N/A  Cardiac Risk Factors include: advanced age (>97men, >2 women);dyslipidemia;hypertension;obesity (BMI >30kg/m2)     Objective:     Vitals: BP 122/64 (BP Location: Left Arm)   Pulse 68   Temp 98.3 F (36.8 C) (Oral)   Ht 5\' 5"  (1.651 m)   Wt 181 lb 14.4 oz (82.5 kg)   BMI 30.27 kg/m   Body mass index is 30.27 kg/m.   Tobacco History  Smoking Status  . Former Smoker  . Quit date: 05/01/1977  Smokeless Tobacco  . Never Used     Counseling given: Not Answered   Past Medical History:  Diagnosis Date  . Hyperlipidemia   . Hypertension   . Thyroid disease    History reviewed. No pertinent surgical history. Family History  Problem Relation Age of Onset  . Cancer Mother   . Diabetes Father   . Stroke Father    History  Sexual Activity  . Sexual activity: Not on file    Outpatient Encounter Prescriptions as of 11/20/2016  Medication Sig  . aspirin 81 MG tablet Take 1 tablet by mouth daily.  . Calcium-Phosphorus-Vitamin D 100-50-100 MG-MG-UNIT CHEW Chew 1 tablet by mouth.  . fluticasone (FLONASE) 50 MCG/ACT nasal spray Place 1 spray into both nostrils daily.  Marland Kitchen levothyroxine (SYNTHROID, LEVOTHROID) 88 MCG tablet Take 1 tablet (88 mcg total) by mouth daily before breakfast.  . losartan (COZAAR) 25 MG tablet Take 1 tablet (25 mg total) by mouth daily.  . MULTIPLE VITAMIN PO Take by mouth daily.  . Multiple Vitamins-Minerals (HAIR SKIN AND NAILS FORMULA PO) Take by mouth.  . Omega-3 Fatty Acids (FISH OIL BURP-LESS) 1200 MG CAPS Take 1 capsule by mouth.  . pravastatin (PRAVACHOL) 40 MG tablet Take 1 tablet (40 mg total) by mouth daily.   No facility-administered encounter medications on file as of 11/20/2016.     Activities of Daily Living In your present state of health, do you have any difficulty performing the  following activities: 11/20/2016 09/06/2016  Hearing? Tempie Donning  Vision? Y Y  Difficulty concentrating or making decisions? Y N  Walking or climbing stairs? N N  Dressing or bathing? N N  Doing errands, shopping? N N  Preparing Food and eating ? N -  Using the Toilet? N -  In the past six months, have you accidently leaked urine? Y -  Do you have problems with loss of bowel control? N -  Managing your Medications? N -  Managing your Finances? N -  Housekeeping or managing your Housekeeping? N -  Some recent data might be hidden    Patient Care Team: Roselee Nova, MD as PCP - General (Family Medicine) Birder Robson, MD as Referring Physician (Ophthalmology)    Assessment:     Exercise Activities and Dietary recommendations Current Exercise Habits: Structured exercise class, Type of exercise: Other - see comments;stretching;treadmill (water aerobics), Time (Minutes): 60, Frequency (Times/Week): 3, Weekly Exercise (Minutes/Week): 180, Intensity: Mild, Exercise limited by: None identified  Goals    . Increase water intake          Recommend increasing water intake to 6-8 glasses a day.       Fall Risk Fall Risk  11/20/2016 09/06/2016 11/10/2015 08/16/2015 03/16/2015  Falls in the past year? No No No No No   Depression Screen PHQ 2/9 Scores 11/20/2016  09/06/2016 11/10/2015 08/16/2015  PHQ - 2 Score 0 0 0 0     Cognitive Function     6CIT Screen 11/20/2016  What Year? 0 points  What month? 0 points  What time? 0 points  Count back from 20 0 points  Months in reverse 0 points  Repeat phrase 0 points  Total Score 0    Immunization History  Administered Date(s) Administered  . Influenza, High Dose Seasonal PF 03/16/2015  . Pneumococcal Conjugate-13 11/20/2016  . Pneumococcal Polysaccharide-23 05/01/2012   Screening Tests Health Maintenance  Topic Date Due  . DEXA SCAN  03/31/2017 (Originally 11/05/2012)  . COLONOSCOPY  03/31/2017 (Originally 11/05/1997)  . Hepatitis C  Screening  08/29/2017 (Originally Sep 18, 1945)  . TETANUS/TDAP  05/01/2026 (Originally 11/06/1966)  . INFLUENZA VACCINE  11/29/2016  . PNA vac Low Risk Adult (2 of 2 - PPSV23) 11/20/2017  . MAMMOGRAM  12/29/2017      Plan:  I have personally reviewed and addressed the Medicare Annual Wellness questionnaire and have noted the following in the patient's chart:  A. Medical and social history B. Use of alcohol, tobacco or illicit drugs  C. Current medications and supplements D. Functional ability and status E.  Nutritional status F.  Physical activity G. Advance directives H. List of other physicians I.  Hospitalizations, surgeries, and ER visits in previous 12 months J.  [place] such as hearing and vision if needed, cognitive and depression L. Referrals and appointments - none  In addition, I have reviewed and discussed with patient certain preventive protocols, quality metrics, and best practice recommendations. A written personalized care plan for preventive services as well as general preventive health recommendations were provided to patient.  See attached scanned questionnaire for additional information.   Signed,  Fabio Neighbors, LPN Nurse Health Advisor   MD Recommendations: Referral for a DEXA screening sent today. Pt declined the tetanus vaccine and Hepatitis C screening today. Pt to have the Hep C drawn with additional blood work for annual in 2019. Pt would like to speak with the doctor further about cologuard. Pt does not want a colonoscopy done.

## 2016-11-20 NOTE — Patient Instructions (Signed)
Paige [name] , Thank you for taking time to come for your Medicare Wellness Visit. I appreciate your ongoing commitment to your health goals. Please review the following plan we discussed and let me know if I can assist you in the future.   Screening recommendations/referrals: Colonoscopy: declined Mammogram: completed 12/30/15, due 11/2016 Bone Density: referral sent today Recommended yearly ophthalmology/optometry visit for glaucoma screening and checkup Recommended yearly dental visit for hygiene and checkup  Vaccinations: Influenza vaccine: due 12/2016 Pneumococcal vaccine: Prevnar 13 given today Tdap vaccine: declined Shingles vaccine: declined    Advanced directives: Advance directive discussed with you today. I have provided a copy for you to complete at home and have notarized. Once this is complete please bring a copy in to our office so we can scan it into your chart.  Conditions/risks identified: Obesity; Recommend increasing water intake to 6-8 glasses a day.   Next appointment: None, need to scheduled 1 year AWV   Preventive Care 49 Years and Older, Female Preventive care refers to lifestyle choices and visits with your health care provider that can promote health and wellness. What does preventive care include?  A yearly physical exam. This is also called an annual well check.  Dental exams once or twice a year.  Routine eye exams. Ask your health care provider how often you should have your eyes checked.  Personal lifestyle choices, including:  Daily care of your teeth and gums.  Regular physical activity.  Eating a healthy diet.  Avoiding tobacco and drug use.  Limiting alcohol use.  Practicing safe sex.  Taking low-dose aspirin every day.  Taking vitamin and mineral supplements as recommended by your health care provider. What happens during an annual well check? The services and screenings done by your health care provider during your annual  well check will depend on your age, overall health, lifestyle risk factors, and family history of disease. Counseling  Your health care provider may ask you questions about your:  Alcohol use.  Tobacco use.  Drug use.  Emotional well-being.  Home and relationship well-being.  Sexual activity.  Eating habits.  History of falls.  Memory and ability to understand (cognition).  Work and work Statistician.  Reproductive health. Screening  You may have the following tests or measurements:  Height, weight, and BMI.  Blood pressure.  Lipid and cholesterol levels. These may be checked every 5 years, or more frequently if you are over 31 years old.  Skin check.  Lung cancer screening. You may have this screening every year starting at age 25 if you have a 30-pack-year history of smoking and currently smoke or have quit within the past 15 years.  Fecal occult blood test (FOBT) of the stool. You may have this test every year starting at age 19.  Flexible sigmoidoscopy or colonoscopy. You may have a sigmoidoscopy every 5 years or a colonoscopy every 10 years starting at age 61.  Hepatitis C blood test.  Hepatitis B blood test.  Sexually transmitted disease (STD) testing.  Diabetes screening. This is done by checking your blood sugar (glucose) after you have not eaten for a while (fasting). You may have this done every 1-3 years.  Bone density scan. This is done to screen for osteoporosis. You may have this done starting at age 46.  Mammogram. This may be done every 1-2 years. Talk to your health care provider about how often you should have regular mammograms. Talk with your health care provider about your test results,  treatment options, and if necessary, the need for more tests. Vaccines  Your health care provider may recommend certain vaccines, such as:  Influenza vaccine. This is recommended every year.  Tetanus, diphtheria, and acellular pertussis (Tdap, Td) vaccine.  You may need a Td booster every 10 years.  Zoster vaccine. You may need this after age 50.  Pneumococcal 13-valent conjugate (PCV13) vaccine. One dose is recommended after age 67.  Pneumococcal polysaccharide (PPSV23) vaccine. One dose is recommended after age 84. Talk to your health care provider about which screenings and vaccines you need and how often you need them. This information is not intended to replace advice given to you by your health care provider. Make sure you discuss any questions you have with your health care provider. Document Released: 05/14/2015 Document Revised: 01/05/2016 Document Reviewed: 02/16/2015 Elsevier Interactive Patient Education  2017 [employer] Prevention in the Home Falls can cause injuries. They can happen to people of all ages. There are many things you can do to make your home safe and to help prevent falls. What can I do on the outside of my home?  Regularly fix the edges of walkways and driveways and fix any cracks.  Remove anything that might make you trip as you walk through a door, such as a raised step or threshold.  Trim any bushes or trees on the path to your home.  Use bright outdoor lighting.  Clear any walking paths of anything that might make someone trip, such as rocks or tools.  Regularly check to see if handrails are loose or broken. Make sure that both sides of any steps have handrails.  Any raised decks and porches should have guardrails on the edges.  Have any leaves, snow, or ice cleared regularly.  Use sand or salt on walking paths during winter.  Clean up any spills in your garage right away. This includes oil or grease spills. What can I do in the bathroom?  Use night lights.  Install grab bars by the toilet and in the tub and shower. Do not use towel bars as grab bars.  Use non-skid mats or decals in the tub or shower.  If you need to sit down in the shower, use a plastic, non-slip stool.  Keep the  floor dry. Clean up any water that spills on the floor as soon as it happens.  Remove soap buildup in the tub or shower regularly.  Attach bath mats securely with double-sided non-slip rug tape.  Do not have throw rugs and other things on the floor that can make you trip. What can I do in the bedroom?  Use night lights.  Make sure that you have a light by your bed that is easy to reach.  Do not use any sheets or blankets that are too big for your bed. They should not hang down onto the floor.  Have a firm chair that has side arms. You can use this for support while you get dressed.  Do not have throw rugs and other things on the floor that can make you trip. What can I do in the kitchen?  Clean up any spills right away.  Avoid walking on wet floors.  Keep items that you use a lot in easy-to-reach places.  If you need to reach something above you, use a strong step stool that has a grab bar.  Keep electrical cords out of the way.  Do not use floor polish or wax that makes floors  slippery. If you must use wax, use non-skid floor wax.  Do not have throw rugs and other things on the floor that can make you trip. What can I do with my stairs?  Do not leave any items on the stairs.  Make sure that there are handrails on both sides of the stairs and use them. Fix handrails that are broken or loose. Make sure that handrails are as long as the stairways.  Check any carpeting to make sure that it is firmly attached to the stairs. Fix any carpet that is loose or worn.  Avoid having throw rugs at the top or bottom of the stairs. If you do have throw rugs, attach them to the floor with carpet tape.  Make sure that you have a light switch at the top of the stairs and the bottom of the stairs. If you do not have them, ask someone to add them for you. What else can I do to help prevent falls?  Wear shoes that:  Do not have high heels.  Have rubber bottoms.  Are comfortable and fit  you well.  Are closed at the toe. Do not wear sandals.  If you use a stepladder:  Make sure that it is fully opened. Do not climb a closed stepladder.  Make sure that both sides of the stepladder are locked into place.  Ask someone to hold it for you, if possible.  Clearly mark and make sure that you can see:  Any grab bars or handrails.  First and last steps.  Where the edge of each step is.  Use tools that help you move around (mobility aids) if they are needed. These include:  Canes.  Walkers.  Scooters.  Crutches.  Turn on the lights when you go into a dark area. Replace any light bulbs as soon as they burn out.  Set up your furniture so you have a clear path. Avoid moving your furniture around.  If any of your floors are uneven, fix them.  If there are any pets around you, be aware of where they are.  Review your medicines with your doctor. Some medicines can make you feel dizzy. This can increase your chance of falling. Ask your doctor what other things that you can do to help prevent falls. This information is not intended to replace advice given to you by your health care provider. Make sure you discuss any questions you have with your health care provider. Document Released: 02/11/2009 Document Revised: 09/23/2015 Document Reviewed: 05/22/2014 Elsevier Interactive Patient Education  2017 [employer].

## 2016-11-20 NOTE — Telephone Encounter (Signed)
Pt was seen in office today for an AWV. Pt is requesting a refill on Flonase nasal spray for her seasonal allergies. Pt would like 1 bottle for 90 days to be sent to [facility]. Please advise, thanks! -MM

## 2016-12-03 ENCOUNTER — Other Ambulatory Visit: Payer: Self-pay | Admitting: Family Medicine

## 2016-12-03 DIAGNOSIS — E7800 Pure hypercholesterolemia, unspecified: Secondary | ICD-10-CM

## 2016-12-04 NOTE — Telephone Encounter (Signed)
Statin refill requested; Dr. Manuella Ghazi approved 6 month supply in May; she should have enough until November; DENIED

## 2017-01-12 DIAGNOSIS — H2513 Age-related nuclear cataract, bilateral: Secondary | ICD-10-CM | POA: Diagnosis not present

## 2017-01-15 ENCOUNTER — Encounter: Payer: Self-pay | Admitting: Family Medicine

## 2017-01-15 ENCOUNTER — Ambulatory Visit (INDEPENDENT_AMBULATORY_CARE_PROVIDER_SITE_OTHER): Payer: Medicare Other | Admitting: Family Medicine

## 2017-01-15 VITALS — BP 135/67 | HR 74 | Temp 98.4°F | Resp 15 | Ht 65.0 in | Wt 183.3 lb

## 2017-01-15 DIAGNOSIS — J0100 Acute maxillary sinusitis, unspecified: Secondary | ICD-10-CM | POA: Diagnosis not present

## 2017-01-15 MED ORDER — AMOXICILLIN-POT CLAVULANATE 875-125 MG PO TABS: 1.0000 | ORAL_TABLET | Freq: Two times a day (BID) | ORAL | 0 refills | Status: AC

## 2017-01-15 MED ORDER — MOMETASONE FUROATE 50 MCG/ACT NA SUSP: 2.0000 | Freq: Every day | NASAL | 0 refills | Status: DC

## 2017-01-15 NOTE — Progress Notes (Signed)
Name: Paige [name]   MRN: [id number]    DOB: 1945-10-04   Date:01/15/2017       Progress Note  Subjective  Chief Complaint  Chief Complaint  Patient presents with  . Acute Visit    light headed x1 wk    Sinusitis  This is a new problem. The current episode started 1 to 4 weeks ago (more than 1 week ago, started after she attended a party in a wooded area and on the way home started experiencing pain in her throat). There has been no fever. She is experiencing no pain. Associated symptoms include congestion, coughing and a sore throat. Pertinent negatives include no neck pain, sinus pressure or sneezing. Past treatments include oral decongestants (OTC decongestant).  Dizziness  This is a new problem. The problem has been unchanged. Associated symptoms include congestion, coughing, nausea and a sore throat. Pertinent negatives include no fever or neck pain. The symptoms are aggravated by bending (leaning over and looking down made it worse yesterday). Treatments tried: oral decongestant.     Past Medical History:  Diagnosis Date  . Hyperlipidemia   . Hypertension   . Thyroid disease     History reviewed. No pertinent surgical history.  Family History  Problem Relation Age of Onset  . Cancer Mother   . Diabetes Father   . Stroke Father     Social History   Social History  . Marital status: Married    Spouse name: N/A  . Number of children: N/A  . Years of education: N/A   Occupational History  . Not on file.   Social History Main Topics  . Smoking status: Former Smoker    Quit date: 05/01/1977  . Smokeless tobacco: Never Used  . Alcohol use No  . Drug use: No  . Sexual activity: Not on file   Other Topics Concern  . Not on file   Social History Narrative  . No narrative on file     Current Outpatient Prescriptions:  .  aspirin 81 MG tablet, Take 1 tablet by mouth daily., Disp: , Rfl:  .  Calcium-Phosphorus-Vitamin D 100-50-100 MG-MG-UNIT CHEW, Chew 1  tablet by mouth., Disp: , Rfl:  .  fluticasone (FLONASE) 50 MCG/ACT nasal spray, Place 1 spray into both nostrils daily., Disp: , Rfl:  .  levothyroxine (SYNTHROID, LEVOTHROID) 88 MCG tablet, Take 1 tablet (88 mcg total) by mouth daily before breakfast., Disp: 90 tablet, Rfl: 1 .  losartan (COZAAR) 25 MG tablet, Take 1 tablet (25 mg total) by mouth daily., Disp: 90 tablet, Rfl: 1 .  MULTIPLE VITAMIN PO, Take by mouth daily., Disp: , Rfl:  .  Multiple Vitamins-Minerals (HAIR SKIN AND NAILS FORMULA PO), Take by mouth., Disp: , Rfl:  .  Omega-3 Fatty Acids (FISH OIL BURP-LESS) 1200 MG CAPS, Take 1 capsule by mouth., Disp: , Rfl:  .  pravastatin (PRAVACHOL) 40 MG tablet, Take 1 tablet (40 mg total) by mouth daily., Disp: 90 tablet, Rfl: 1  Allergies  Allergen Reactions  . Codeine Nausea And Vomiting     Review of Systems  Constitutional: Negative for fever.  HENT: Positive for congestion and sore throat. Negative for sinus pressure and sneezing.   Respiratory: Positive for cough.   Gastrointestinal: Positive for nausea.  Musculoskeletal: Negative for neck pain.  Neurological: Positive for dizziness.      Objective  Vitals:   01/15/17 1148  BP: 135/67  Pulse: 74  Resp: 15  Temp: 98.4 F (36.9 C)  TempSrc: Oral  SpO2: 98%  Weight: 183 lb 4.8 oz (83.1 kg)  Height: 5\' 5"  (1.651 m)    Physical Exam  Constitutional: She is well-developed, well-nourished, and in no distress.  HENT:  Head: Normocephalic and atraumatic.  Right Ear: External ear normal.  Left Ear: External ear normal.  Nose: Right sinus exhibits maxillary sinus tenderness. Left sinus exhibits maxillary sinus tenderness.  Mouth/Throat: Posterior oropharyngeal erythema present. No oropharyngeal exudate or posterior oropharyngeal edema.  Nasal mucosal inflammation  Neck: Neck supple. No thyroid mass present.  Cardiovascular: Normal rate, regular rhythm, S1 normal, S2 normal and normal heart sounds.    Pulmonary/Chest: Effort normal and breath sounds normal. She has no wheezes.  Nursing note and vitals reviewed.     Assessment & Plan  1. Acute non-recurrent maxillary sinusitis Start on Nasonex for relief of nasal inflammation and sinusitis, and Videx if symptoms do not improve within the next 24-48 hours - mometasone (NASONEX) 50 MCG/ACT nasal spray; Place 2 sprays into the nose daily.  Dispense: 17 g; Refill: 0 - amoxicillin-clavulanate (AUGMENTIN) 875-125 MG tablet; Take 1 tablet by mouth 2 (two) times daily.  Dispense: 20 tablet; Refill: 0    Lilliam Chamblee Asad A. [facility] 01/15/2017 12:01 PM

## 2017-01-22 ENCOUNTER — Other Ambulatory Visit: Payer: Self-pay | Admitting: Family Medicine

## 2017-01-22 ENCOUNTER — Ambulatory Visit (INDEPENDENT_AMBULATORY_CARE_PROVIDER_SITE_OTHER): Payer: Medicare Other | Admitting: Family Medicine

## 2017-01-22 ENCOUNTER — Encounter: Payer: Self-pay | Admitting: Family Medicine

## 2017-01-22 VITALS — BP 124/82 | HR 78 | Temp 98.8°F | Resp 16 | Ht 65.0 in | Wt 183.4 lb

## 2017-01-22 DIAGNOSIS — H9313 Tinnitus, bilateral: Secondary | ICD-10-CM

## 2017-01-22 DIAGNOSIS — E7800 Pure hypercholesterolemia, unspecified: Secondary | ICD-10-CM

## 2017-01-22 DIAGNOSIS — J3089 Other allergic rhinitis: Secondary | ICD-10-CM

## 2017-01-22 DIAGNOSIS — R42 Dizziness and giddiness: Secondary | ICD-10-CM | POA: Diagnosis not present

## 2017-01-22 DIAGNOSIS — E039 Hypothyroidism, unspecified: Secondary | ICD-10-CM

## 2017-01-22 DIAGNOSIS — I10 Essential (primary) hypertension: Secondary | ICD-10-CM

## 2017-01-22 MED ORDER — FLUTICASONE PROPIONATE 50 MCG/ACT NA SUSP: 1.0000 | Freq: Every day | NASAL | 3 refills | Status: DC

## 2017-01-22 MED ORDER — MECLIZINE HCL 12.5 MG PO TABS: 6.2500 mg | ORAL_TABLET | Freq: Three times a day (TID) | ORAL | 0 refills | Status: DC | PRN

## 2017-01-22 NOTE — Progress Notes (Addendum)
Name: Paige [name]   MRN: [id number]    DOB: 09-03-1945   Date:01/22/2017       Progress Note  Subjective  Chief Complaint  Chief Complaint  Patient presents with  . Follow-up    was seen for dizziness 1 week ago, still not feeling any better, still dizzy and off with nausea    HPI  Patient presents to follow up on dizziness - she was seen by Dr. Manuella Ghazi 01/15/2017 for dizziness and was treated for concurrent Sinusitis.  Dizziness has improved, but is still present; she has 2 days left of Augmentin.  She has a history of tinnitus for several years - this has not worsened over the last several days. Denies weakness, confusion, slurred speech or difficulty swallowing, no changes in feeling on either side of body, gait disturbance, or facial droop; no nausea/vomiting/diahrea.  Dizziness occurs while walking, does not occur with position changes - no recent falls, does not need to hold on to walls or furniture to get around.  - No history of TIA/Stroke, has HTN which is well controlled, taking Aspirin 81mg  Daily and Prevastatin Daily.  Patient Active Problem List   Diagnosis Date Noted  . Annual physical exam 11/10/2014  . Physical exam, annual 11/10/2014  . Colon cancer screening 11/10/2014  . Screening for breast cancer 11/10/2014  . Hyperlipidemia 10/27/2014  . Allergic rhinitis 10/27/2014  . BP (high blood pressure) 10/27/2014  . Adult hypothyroidism 10/13/2014    Social History  Substance Use Topics  . Smoking status: Former Smoker    Quit date: 05/01/1977  . Smokeless tobacco: Never Used  . Alcohol use No     Current Outpatient Prescriptions:  .  aspirin 81 MG tablet, Take 1 tablet by mouth daily., Disp: , Rfl:  .  Calcium-Phosphorus-Vitamin D 100-50-100 MG-MG-UNIT CHEW, Chew 1 tablet by mouth., Disp: , Rfl:  .  fluticasone (FLONASE) 50 MCG/ACT nasal spray, Place 1 spray into both nostrils daily., Disp: , Rfl:  .  levothyroxine (SYNTHROID, LEVOTHROID) 88 MCG tablet, Take  1 tablet (88 mcg total) by mouth daily before breakfast., Disp: 90 tablet, Rfl: 1 .  losartan (COZAAR) 25 MG tablet, Take 1 tablet (25 mg total) by mouth daily., Disp: 90 tablet, Rfl: 1 .  mometasone (NASONEX) 50 MCG/ACT nasal spray, Place 2 sprays into the nose daily., Disp: 17 g, Rfl: 0 .  MULTIPLE VITAMIN PO, Take by mouth daily., Disp: , Rfl:  .  Multiple Vitamins-Minerals (HAIR SKIN AND NAILS FORMULA PO), Take by mouth., Disp: , Rfl:  .  Omega-3 Fatty Acids (FISH OIL BURP-LESS) 1200 MG CAPS, Take 1 capsule by mouth., Disp: , Rfl:  .  pravastatin (PRAVACHOL) 40 MG tablet, Take 1 tablet (40 mg total) by mouth daily., Disp: 90 tablet, Rfl: 1 .  amoxicillin-clavulanate (AUGMENTIN) 875-125 MG tablet, Take 1 tablet by mouth 2 (two) times daily. (Patient not taking: Reported on 01/22/2017), Disp: 20 tablet, Rfl: 0  Allergies  Allergen Reactions  . Codeine Nausea And Vomiting    ROS  Ten systems reviewed and is negative except as mentioned in HPI  Objective  Vitals:   01/22/17 1112  BP: 124/82  Pulse: 78  Resp: 16  Temp: 98.8 F (37.1 C)  TempSrc: Oral  SpO2: 95%  Weight: 183 lb 6.4 oz (83.2 kg)  Height: 5\' 5"  (1.651 m)    Body mass index is 30.52 kg/m.  Nursing Note and Vital Signs reviewed.  Physical Exam  Constitutional: Patient appears well-developed and  well-nourished. No distress.  HEENT: head atraumatic, normocephalic, pupils equal and reactive to light, EOM's intact, TM's without erythema or bulging, no maxillary or frontal sinus pain on palpation, neck supple without lymphadenopathy, oropharynx pink and moist without exudate Cardiovascular: Normal rate, regular rhythm, S1/S2 present.  No murmur or rub heard. No BLE edema. Pulmonary/Chest: Effort normal and breath sounds clear. No respiratory distress or retractions. Psychiatric: Patient has a normal mood and affect. behavior is normal. Judgment and thought content normal. Pulmonary/Chest: Effort normal and breath  sounds normal. No respiratory distress. Abdominal: Soft. Bowel sounds are normal, no distension. There is no tenderness. no masses Musculoskeletal: Normal range of motion, no joint effusions. No gross deformities Neurological: she is alert and oriented to person, place, and time. No cranial nerve deficit. Negative Dix-Hall-Pike, extremities have equal strength bilaterally, no dysdiadochokinesia, no ataxia, no gait disturbance, negative Romberg.  Coordination, balance, strength, speech and gait are normal.  Skin: Skin is warm and dry. No rash noted. No erythema.   No results found for this or any previous visit (from the past 2160 hour(s)).   Assessment & Plan  1. Seasonal allergic rhinitis due to other allergic trigger - fluticasone (FLONASE) 50 MCG/ACT nasal spray; Place 1 spray into both nostrils daily.  Dispense: 16 g; Refill: 3 - Advised to continue Flonase as this may help with tinnitus and dizziness if eustachian tube dysfunction is present.  2. Tinnitus of both ears - Ambulatory referral to ENT - Ongoing for many years, we will send for evaluation by ENT.  3. Dizziness - Ambulatory referral to ENT - meclizine (ANTIVERT) 12.5 MG tablet; Take 0.5-1 tablets (6.25-12.5 mg total) by mouth 3 (three) times daily as needed for dizziness.  Dispense: 15 tablet; Refill: 0 - Discussed signs and symptoms of stroke at length with patient and she is in agreement to present for [facility] is dizziness worsens or any red flags as listed below occur. - Because symptoms have improved since starting Augmentin, she will call back in 3-5 days if not continuing to improve after finishing Augmentin/taking Antivert, and we will consider further evaluation while awaiting ENT. -Red flags and when to present for emergency care or RTC including fever >101.78F, chest pain, shortness of breath, new/worsening/un-resolving symptoms, reviewed with patient at time of visit. Follow up and care instructions discussed and provided  in AVS.  I have reviewed this encounter including the documentation in this note and/or discussed this patient with the Johney Maine, FNP, NP-C. I am certifying that I agree with the content of this note as supervising physician.  Steele Sizer, MD [facility] 01/22/2017, 5:15 PM

## 2017-01-22 NOTE — Patient Instructions (Addendum)

## 2017-01-23 NOTE — Telephone Encounter (Signed)
Medication has been refilled and sent to [facility] order

## 2017-01-24 ENCOUNTER — Telehealth: Payer: Self-pay | Admitting: Family Medicine

## 2017-01-24 NOTE — Telephone Encounter (Signed)
Patient called and stated she is feeling much better. Has appointment with ENT for Monday

## 2017-01-24 NOTE — Telephone Encounter (Signed)
-----   Message from Hubbard Hartshorn, FNP sent at 01/22/2017 12:02 PM EDT ----- Regarding: Follow Up Call Please call patient and ask how she is doing. If she is still having dizziness, please let me know and I will speak with her about drawing labs and/or possibility of imaging. Thanks!

## 2017-01-24 NOTE — Telephone Encounter (Signed)
Left message for patient to call.

## 2017-01-29 ENCOUNTER — Other Ambulatory Visit: Payer: Self-pay | Admitting: Otolaryngology

## 2017-01-29 DIAGNOSIS — R42 Dizziness and giddiness: Secondary | ICD-10-CM

## 2017-01-29 DIAGNOSIS — H9312 Tinnitus, left ear: Secondary | ICD-10-CM

## 2017-01-29 DIAGNOSIS — H903 Sensorineural hearing loss, bilateral: Secondary | ICD-10-CM | POA: Diagnosis not present

## 2017-01-29 DIAGNOSIS — J301 Allergic rhinitis due to pollen: Secondary | ICD-10-CM | POA: Diagnosis not present

## 2017-02-03 ENCOUNTER — Ambulatory Visit
Admission: RE | Admit: 2017-02-03 | Discharge: 2017-02-03 | Disposition: A | Discharge status: Home / Self Care | Payer: Medicare Other | Source: Ambulatory Visit | Attending: Otolaryngology | Admitting: Otolaryngology

## 2017-02-03 DIAGNOSIS — R42 Dizziness and giddiness: Secondary | ICD-10-CM | POA: Diagnosis not present

## 2017-02-03 DIAGNOSIS — H9312 Tinnitus, left ear: Secondary | ICD-10-CM | POA: Insufficient documentation

## 2017-02-03 LAB — POCT I-STAT CREATININE: CREATININE: 0.9 mg/dL (ref 0.44–1.00)

## 2017-02-03 MED ORDER — GADOBENATE DIMEGLUMINE 529 MG/ML IV SOLN
17.0000 mL | Freq: Once | INTRAVENOUS | Status: AC | PRN
  Administered 2017-02-03: 17 mL via INTRAVENOUS

## 2017-02-05 DIAGNOSIS — Z1231 Encounter for screening mammogram for malignant neoplasm of breast: Secondary | ICD-10-CM | POA: Diagnosis not present

## 2017-02-08 ENCOUNTER — Encounter: Payer: Self-pay | Admitting: Family Medicine

## 2017-02-16 DIAGNOSIS — R42 Dizziness and giddiness: Secondary | ICD-10-CM | POA: Diagnosis not present

## 2017-03-08 DIAGNOSIS — R42 Dizziness and giddiness: Secondary | ICD-10-CM | POA: Diagnosis not present

## 2017-03-16 ENCOUNTER — Ambulatory Visit: Payer: Medicare Other | Admitting: Family Medicine

## 2017-03-16 DIAGNOSIS — Z23 Encounter for immunization: Secondary | ICD-10-CM | POA: Diagnosis not present

## 2017-03-19 ENCOUNTER — Ambulatory Visit: Payer: Self-pay | Admitting: Family Medicine

## 2017-03-28 ENCOUNTER — Other Ambulatory Visit: Payer: Self-pay | Admitting: Family Medicine

## 2017-03-28 DIAGNOSIS — E7800 Pure hypercholesterolemia, unspecified: Secondary | ICD-10-CM

## 2017-03-30 MED ORDER — PRAVASTATIN SODIUM 40 MG PO TABS: 40.0000 mg | ORAL_TABLET | Freq: Every day | ORAL | 0 refills | Status: DC

## 2017-03-30 NOTE — Telephone Encounter (Signed)
Pt says that she tried getting in to see provider before starting her new job. Pt says that she will not be able to come in before January due to her job. She would like to know if provider would refill her medications until then?    CB: [phone]

## 2017-03-30 NOTE — Addendum Note (Signed)
Addended by: Bud Face N on: 03/30/2017 01:15 PM   Modules accepted: Orders

## 2017-04-11 ENCOUNTER — Ambulatory Visit (INDEPENDENT_AMBULATORY_CARE_PROVIDER_SITE_OTHER): Payer: Medicare Other | Admitting: Family Medicine

## 2017-04-11 ENCOUNTER — Encounter: Payer: Self-pay | Admitting: Family Medicine

## 2017-04-11 VITALS — BP 136/84 | HR 66 | Temp 98.3°F | Resp 16 | Wt 182.8 lb

## 2017-04-11 DIAGNOSIS — J0141 Acute recurrent pansinusitis: Secondary | ICD-10-CM | POA: Diagnosis not present

## 2017-04-11 MED ORDER — AZITHROMYCIN 250 MG PO TABS: ORAL_TABLET | ORAL | 0 refills | Status: DC

## 2017-04-11 NOTE — Progress Notes (Signed)
Name: Paige [name]   MRN: [id number]    DOB: 03-27-1946   Date:04/11/2017       Progress Note  Subjective  Chief Complaint  Chief Complaint  Patient presents with  . Sinus Problem    horseness, cough and some ear and head pressure; started saturday afternoon.     Sinus Problem  This is a new problem. The current episode started in the past 7 days (5 days). The problem is unchanged. There has been no fever. She is experiencing no pain. Associated symptoms include congestion (in throat and sensation of phlegm), a hoarse voice and sinus pressure. Pertinent negatives include no chills, ear pain, shortness of breath or sore throat. Past treatments include oral decongestants (has taken decongestant and Advil.).     Past Medical History:  Diagnosis Date  . Hyperlipidemia   . Hypertension   . Thyroid disease     History reviewed. No pertinent surgical history.  Family History  Problem Relation Age of Onset  . Cancer Mother   . Diabetes Father   . Stroke Father     Social History   Socioeconomic History  . Marital status: Married    Spouse name: Not on file  . Number of children: Not on file  . Years of education: Not on file  . Highest education level: Not on file  Social Needs  . Financial resource strain: Not on file  . Food insecurity - worry: Not on file  . Food insecurity - inability: Not on file  . Transportation needs - medical: Not on file  . Transportation needs - non-medical: Not on file  Occupational History  . Not on file  Tobacco Use  . Smoking status: Former Smoker    Last attempt to quit: 05/01/1977    Years since quitting: 39.9  . Smokeless tobacco: Never Used  Substance and Sexual Activity  . Alcohol use: No  . Drug use: No  . Sexual activity: Not on file  Other Topics Concern  . Not on file  Social History Narrative  . Not on file     Current Outpatient Medications:  .  aspirin 81 MG tablet, Take 1 tablet by mouth daily., Disp: , Rfl:   .  Calcium-Phosphorus-Vitamin D 100-50-100 MG-MG-UNIT CHEW, Chew 1 tablet by mouth., Disp: , Rfl:  .  fluticasone (FLONASE) 50 MCG/ACT nasal spray, Place 1 spray into both nostrils daily., Disp: 16 g, Rfl: 3 .  levothyroxine (SYNTHROID, LEVOTHROID) 88 MCG tablet, TAKE 1 TABLET (88 MCG TOTAL) BY MOUTH DAILY BEFORE BREAKFAST., Disp: 90 tablet, Rfl: 0 .  losartan (COZAAR) 25 MG tablet, TAKE 1 TABLET (25 MG TOTAL) BY MOUTH DAILY., Disp: 90 tablet, Rfl: 0 .  MULTIPLE VITAMIN PO, Take by mouth daily., Disp: , Rfl:  .  Multiple Vitamins-Minerals (HAIR SKIN AND NAILS FORMULA PO), Take by mouth., Disp: , Rfl:  .  Omega-3 Fatty Acids (FISH OIL BURP-LESS) 1200 MG CAPS, Take 1 capsule by mouth., Disp: , Rfl:  .  meclizine (ANTIVERT) 12.5 MG tablet, Take 0.5-1 tablets (6.25-12.5 mg total) by mouth 3 (three) times daily as needed for dizziness. (Patient not taking: Reported on 04/11/2017), Disp: 15 tablet, Rfl: 0 .  pravastatin (PRAVACHOL) 40 MG tablet, Take 1 tablet (40 mg total) by mouth daily., Disp: 90 tablet, Rfl: 0  Allergies  Allergen Reactions  . Codeine Nausea And Vomiting     Review of Systems  Constitutional: Negative for chills.  HENT: Positive for congestion (in throat and sensation  of phlegm), hoarse voice and sinus pressure. Negative for ear pain and sore throat.   Respiratory: Negative for shortness of breath.      Objective  Vitals:   04/11/17 1142  BP: 136/84  Pulse: 66  Resp: 16  Temp: 98.3 F (36.8 C)  TempSrc: Oral  SpO2: 99%  Weight: 182 lb 12.8 oz (82.9 kg)    Physical Exam  Constitutional: She is oriented to person, place, and time and well-developed, well-nourished, and in no distress.  HENT:  Head: Normocephalic and atraumatic.  Right Ear: Tympanic membrane, external ear and ear canal normal.  Left Ear: Tympanic membrane, external ear and ear canal normal.  Nose: Mucosal edema present. Right sinus exhibits no maxillary sinus tenderness and no frontal sinus  tenderness. Left sinus exhibits no maxillary sinus tenderness and no frontal sinus tenderness.  Mouth/Throat: Posterior oropharyngeal erythema present.  Cardiovascular: Normal rate, regular rhythm, S1 normal, S2 normal and normal heart sounds.  No murmur heard. Pulmonary/Chest: Effort normal. No respiratory distress.  Neurological: She is alert and oriented to person, place, and time.  Psychiatric: Mood, memory, affect and judgment normal.  Nursing note and vitals reviewed.    Recent Results (from the past 2160 hour(s))  I-STAT creatinine     Status: None   Collection Time: 02/03/17 10:16 AM  Result Value Ref Range   Creatinine, Ser 0.90 0.44 - 1.00 mg/dL  HM MAMMOGRAPHY     Status: None   Collection Time: 02/05/17 12:00 AM  Result Value Ref Range   HM Mammogram Self Reported Normal 0-4 Bi-Rad, Self Reported Normal     Assessment & Plan  1. Acute recurrent pansinusitis With post-nasal drainage, start on Azithromycin for treatment. - azithromycin (ZITHROMAX) 250 MG tablet; 2 tabs po day 1, then 1 tab po q day x 4 days  Dispense: 6 tablet; Refill: 0   Mashawn Brazil [facility] 04/11/2017 11:55 AM

## 2017-09-06 ENCOUNTER — Encounter: Payer: Self-pay | Admitting: Nurse Practitioner

## 2017-09-06 ENCOUNTER — Ambulatory Visit (INDEPENDENT_AMBULATORY_CARE_PROVIDER_SITE_OTHER): Payer: Medicare Other | Admitting: Nurse Practitioner

## 2017-09-06 VITALS — BP 126/76 | HR 78 | Temp 98.5°F | Resp 16 | Ht 65.0 in | Wt 180.8 lb

## 2017-09-06 DIAGNOSIS — Z1211 Encounter for screening for malignant neoplasm of colon: Secondary | ICD-10-CM | POA: Diagnosis not present

## 2017-09-06 DIAGNOSIS — E039 Hypothyroidism, unspecified: Secondary | ICD-10-CM

## 2017-09-06 DIAGNOSIS — D2361 Other benign neoplasm of skin of right upper limb, including shoulder: Secondary | ICD-10-CM

## 2017-09-06 DIAGNOSIS — E7800 Pure hypercholesterolemia, unspecified: Secondary | ICD-10-CM | POA: Diagnosis not present

## 2017-09-06 DIAGNOSIS — I10 Essential (primary) hypertension: Secondary | ICD-10-CM

## 2017-09-06 DIAGNOSIS — Z5181 Encounter for therapeutic drug level monitoring: Secondary | ICD-10-CM | POA: Diagnosis not present

## 2017-09-06 DIAGNOSIS — Z1159 Encounter for screening for other viral diseases: Secondary | ICD-10-CM

## 2017-09-06 DIAGNOSIS — Z1212 Encounter for screening for malignant neoplasm of rectum: Secondary | ICD-10-CM | POA: Diagnosis not present

## 2017-09-06 NOTE — Progress Notes (Signed)
Name: Paige [name]   MRN: [id number]    DOB: 06/19/1945   Date:09/06/2017       Progress Note  Subjective  Chief Complaint  Chief Complaint  Patient presents with  . Hypertension    medication refills  . Hyperlipidemia  . Hypothyroidism    HPI  Hypertension Patient is on losartan 25mg .  Takes medications as prescribed with 0 missed doses a month.  Moderately compliant with low-salt diet.  Denies chest pain, headaches, blurry vision, dizziness.  Hyperlipidemia Patient rx pravastatin 40mg  ever night. Takes medications as prescribed with 68missed doses a month.  Diet: 1-2 vegetable a day. Eats out rarely. Fried foods- avoids- sautes foods.  Denies myalgias  Hypothyroidism Patient takes synthroid 53mcg a day- stable dose over 5 years.  denies fatigue, lethargy, or feeling cold, nervousness, trouble sleeping, rapid and irregular heartbeat, diarrhea/constpiation  Arm lesion  Patient has red non-raised lesion with distinct borders on right forearm. Patient states it has been there for 15 years and has not changed in size or coloring. Not painful. Area of lesion is shiny and thin- denies previous bulla or burn or injury. Sts it intermittently has itching that has been relieved with hydrocortisone cream.    Patient Active Problem List   Diagnosis Date Noted  . Hyperlipidemia 10/27/2014  . Allergic rhinitis 10/27/2014  . BP (high blood pressure) 10/27/2014  . Adult hypothyroidism 10/13/2014    Past Medical History:  Diagnosis Date  . Hyperlipidemia   . Hypertension   . Thyroid disease     No past surgical history on file.  Social History   Tobacco Use  . Smoking status: Former Smoker    Last attempt to quit: 05/01/1977    Years since quitting: 40.3  . Smokeless tobacco: Never Used  Substance Use Topics  . Alcohol use: No     Current Outpatient Medications:  .  aspirin 81 MG tablet, Take 1 tablet by mouth daily., Disp: , Rfl:  .  Calcium-Phosphorus-Vitamin D  100-50-100 MG-MG-UNIT CHEW, Chew 1 tablet by mouth., Disp: , Rfl:  .  fluticasone (FLONASE) 50 MCG/ACT nasal spray, Place 1 spray into both nostrils daily., Disp: 16 g, Rfl: 3 .  levothyroxine (SYNTHROID, LEVOTHROID) 88 MCG tablet, TAKE 1 TABLET (88 MCG TOTAL) BY MOUTH DAILY BEFORE BREAKFAST., Disp: 90 tablet, Rfl: 0 .  losartan (COZAAR) 25 MG tablet, TAKE 1 TABLET (25 MG TOTAL) BY MOUTH DAILY., Disp: 90 tablet, Rfl: 0 .  MULTIPLE VITAMIN PO, Take by mouth daily., Disp: , Rfl:  .  Multiple Vitamins-Minerals (HAIR SKIN AND NAILS FORMULA PO), Take by mouth., Disp: , Rfl:  .  Omega-3 Fatty Acids (FISH OIL BURP-LESS) 1200 MG CAPS, Take 1 capsule by mouth., Disp: , Rfl:  .  pravastatin (PRAVACHOL) 40 MG tablet, Take 1 tablet (40 mg total) by mouth daily., Disp: 90 tablet, Rfl: 0  Allergies  Allergen Reactions  . Codeine Nausea And Vomiting    ROS  Constitutional: Negative for fever or weight change.  Respiratory: Negative for cough and shortness of breath.   Cardiovascular: Negative for chest pain or palpitations.  Gastrointestinal: Negative for abdominal pain, no bowel changes.  Musculoskeletal: Negative for gait problem or joint swelling.  Skin: Negative for rash.  Neurological: Negative for dizziness or headache.  No other specific complaints in a complete review of systems (except as listed in HPI above).  Objective  Vitals:   09/06/17 0851  BP: 126/76  Pulse: 78  Resp: 16  Temp: 98.5  F (36.9 C)  TempSrc: Oral  SpO2: 96%  Weight: 180 lb 12.8 oz (82 kg)  Height: 5\' 5"  (1.651 m)    Body mass index is 30.09 kg/m.  Nursing Note and Vital Signs reviewed.  Physical Exam   Constitutional: Patient appears well-developed and well-nourished.  No distress. HEENT: normocephalic, conjunctivae clear, No thyroid nodules palpated- no enlargemetn Cardiovascular: Normal rate, regular rhythm, S1/S2 present.  No murmur or rub heard. No carotid bruits, pulses intact  Pulmonary/Chest:  Effort normal and breath sounds clear. No respiratory distress or retractions. Abdominal: Soft and non-tender, bowel sounds present  Psychiatric: Patient has a normal mood and affect. behavior is normal. Judgment and thought content normal.  No results found for this or any previous visit (from the past 72 hour(s)).  Assessment & Plan  1. Essential hypertension -stable, continue current therapies - COMPLETE METABOLIC PANEL WITH GFR  2. Pure hypercholesterolemia - continue current therapies, monitor levels  - Lipid Profile  3. Hypothyroidism, unspecified type - continue current therapies, monitor levels  - TSH  4. Medication monitoring encounter - COMPLETE METABOLIC PANEL WITH GFR  5. Screening for colorectal cancer - Cologuard  6. Need for hepatitis C screening test - Hepatitis C Antibody  7. 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 Sanda Klein who observed lesion as well appears benign- unchanged in 15 years will refer routine - Ambulatory referral to Dermatology    -Red flags and when to present for emergency care or RTC including fever >101.71F, chest pain, shortness of breath, new/worsening/un-resolving symptoms,  reviewed with patient at time of visit. Follow up and care instructions discussed and provided in AVS. -Reviewed Health Maintenance: cologuard ordered, hep c ordered, reminded pt to get DEXA scan and explained scan  Face-to-face time with patient was more than 25 minutes, >50% time spent counseling and coordination of care

## 2017-09-06 NOTE — Patient Instructions (Signed)
DASH Eating Plan DASH stands for "Dietary Approaches to Stop Hypertension." The DASH eating plan is a healthy eating plan that has been shown to reduce high blood pressure (hypertension). It may also reduce your risk for type 2 diabetes, heart disease, and stroke. The DASH eating plan may also help with weight loss. What are tips for following this plan? General guidelines  Avoid eating more than 2,300 mg (milligrams) of salt (sodium) a day. If you have hypertension, you may need to reduce your sodium intake to 1,500 mg a day.  Limit alcohol intake to no more than 1 drink a day for nonpregnant women and 2 drinks a day for men. One drink equals 12 oz of beer, 5 oz of wine, or 1 oz of hard liquor.  Work with your health care provider to maintain a healthy body weight or to lose weight. Ask what an ideal weight is for you.  Get at least 30 minutes of exercise that causes your heart to beat faster (aerobic exercise) most days of the week. Activities may include walking, swimming, or biking.  Work with your health care provider or diet and nutrition specialist (dietitian) to adjust your eating plan to your individual calorie needs. Reading food labels  Check food labels for the amount of sodium per serving. Choose foods with less than 5 percent of the Daily Value of sodium. Generally, foods with less than 300 mg of sodium per serving fit into this eating plan.  To find whole grains, look for the word "whole" as the first word in the ingredient list. Shopping  Buy products labeled as "low-sodium" or "no salt added."  Buy fresh foods. Avoid canned foods and premade or frozen meals. Cooking  Avoid adding salt when cooking. Use salt-free seasonings or herbs instead of table salt or sea salt. Check with your health care provider or pharmacist before using salt substitutes.  Do not fry foods. Cook foods using healthy methods such as baking, boiling, grilling, and broiling instead.  Cook with  heart-healthy oils, such as olive, canola, soybean, or sunflower oil. Meal planning   Eat a balanced diet that includes: ? 5 or more servings of fruits and vegetables each day. At each meal, try to fill half of your plate with fruits and vegetables. ? Up to 6-8 servings of whole grains each day. ? Less than 6 oz of lean meat, poultry, or fish each day. A 3-oz serving of meat is about the same size as a deck of cards. One egg equals 1 oz. ? 2 servings of low-fat dairy each day. ? A serving of nuts, seeds, or beans 5 times each week. ? Heart-healthy fats. Healthy fats called Omega-3 fatty acids are found in foods such as flaxseeds and coldwater fish, like sardines, salmon, and mackerel.  Limit how much you eat of the following: ? Canned or prepackaged foods. ? Food that is high in trans fat, such as fried foods. ? Food that is high in saturated fat, such as fatty meat. ? Sweets, desserts, sugary drinks, and other foods with added sugar. ? Full-fat dairy products.  Do not salt foods before eating.  Try to eat at least 2 vegetarian meals each week.  Eat more home-cooked food and less restaurant, buffet, and fast food.  When eating at a restaurant, ask that your food be prepared with less salt or no salt, if possible. What foods are recommended? The items listed may not be a complete list. Talk with your dietitian about what   dietary choices are best for you. Grains Whole-grain or whole-wheat bread. Whole-grain or whole-wheat pasta. Brown rice. Oatmeal. Quinoa. Bulgur. Whole-grain and low-sodium cereals. Pita bread. Low-fat, low-sodium crackers. Whole-wheat flour tortillas. Vegetables Fresh or frozen vegetables (raw, steamed, roasted, or grilled). Low-sodium or reduced-sodium tomato and vegetable juice. Low-sodium or reduced-sodium tomato sauce and tomato paste. Low-sodium or reduced-sodium canned vegetables. Fruits All fresh, dried, or frozen fruit. Canned fruit in natural juice (without  added sugar). Meat and other protein foods Skinless chicken or turkey. Ground chicken or turkey. Pork with fat trimmed off. Fish and seafood. Egg whites. Dried beans, peas, or lentils. Unsalted nuts, nut butters, and seeds. Unsalted canned beans. Lean cuts of beef with fat trimmed off. Low-sodium, lean deli meat. Dairy Low-fat (1%) or fat-free (skim) milk. Fat-free, low-fat, or reduced-fat cheeses. Nonfat, low-sodium ricotta or cottage cheese. Low-fat or nonfat yogurt. Low-fat, low-sodium cheese. Fats and oils Soft margarine without trans fats. Vegetable oil. Low-fat, reduced-fat, or light mayonnaise and salad dressings (reduced-sodium). Canola, safflower, olive, soybean, and sunflower oils. Avocado. Seasoning and other foods Herbs. Spices. Seasoning mixes without salt. Unsalted popcorn and pretzels. Fat-free sweets. What foods are not recommended? The items listed may not be a complete list. Talk with your dietitian about what dietary choices are best for you. Grains Baked goods made with fat, such as croissants, muffins, or some breads. Dry pasta or rice meal packs. Vegetables Creamed or fried vegetables. Vegetables in a cheese sauce. Regular canned vegetables (not low-sodium or reduced-sodium). Regular canned tomato sauce and paste (not low-sodium or reduced-sodium). Regular tomato and vegetable juice (not low-sodium or reduced-sodium). Pickles. Olives. Fruits Canned fruit in a light or heavy syrup. Fried fruit. Fruit in cream or butter sauce. Meat and other protein foods Fatty cuts of meat. Ribs. Fried meat. Bacon. Sausage. Bologna and other processed lunch meats. Salami. Fatback. Hotdogs. Bratwurst. Salted nuts and seeds. Canned beans with added salt. Canned or smoked fish. Whole eggs or egg yolks. Chicken or turkey with skin. Dairy Whole or 2% milk, cream, and half-and-half. Whole or full-fat cream cheese. Whole-fat or sweetened yogurt. Full-fat cheese. Nondairy creamers. Whipped toppings.  Processed cheese and cheese spreads. Fats and oils Butter. Stick margarine. Lard. Shortening. Ghee. Bacon fat. Tropical oils, such as coconut, palm kernel, or palm oil. Seasoning and other foods Salted popcorn and pretzels. Onion salt, garlic salt, seasoned salt, table salt, and sea salt. Worcestershire sauce. Tartar sauce. Barbecue sauce. Teriyaki sauce. Soy sauce, including reduced-sodium. Steak sauce. Canned and packaged gravies. Fish sauce. Oyster sauce. Cocktail sauce. Horseradish that you find on the shelf. Ketchup. Mustard. Meat flavorings and tenderizers. Bouillon cubes. Hot sauce and Tabasco sauce. Premade or packaged marinades. Premade or packaged taco seasonings. Relishes. Regular salad dressings. Where to find more information:  National Heart, Lung, and Blood Institute: www.nhlbi.nih.gov  American Heart Association: www.heart.org Summary  The DASH eating plan is a healthy eating plan that has been shown to reduce high blood pressure (hypertension). It may also reduce your risk for type 2 diabetes, heart disease, and stroke.  With the DASH eating plan, you should limit salt (sodium) intake to 2,300 mg a day. If you have hypertension, you may need to reduce your sodium intake to 1,500 mg a day.  When on the DASH eating plan, aim to eat more fresh fruits and vegetables, whole grains, lean proteins, low-fat dairy, and heart-healthy fats.  Work with your health care provider or diet and nutrition specialist (dietitian) to adjust your eating plan to your individual   calorie needs. This information is not intended to replace advice given to you by your health care provider. Make sure you discuss any questions you have with your health care provider. Document Released: 04/06/2011 Document Revised: 04/10/2016 Document Reviewed: 04/10/2016 Elsevier Interactive Patient Education  2018 Elsevier Inc.  

## 2017-09-07 LAB — COMPLETE METABOLIC PANEL WITH GFR
AG RATIO: 1.5 (calc) (ref 1.0–2.5)
ALBUMIN MSPROF: 4.3 g/dL (ref 3.6–5.1)
ALKALINE PHOSPHATASE (APISO): 50 U/L (ref 33–130)
ALT: 16 U/L (ref 6–29)
AST: 18 U/L (ref 10–35)
BILIRUBIN TOTAL: 0.8 mg/dL (ref 0.2–1.2)
BUN: 19 mg/dL (ref 7–25)
CHLORIDE: 105 mmol/L (ref 98–110)
CO2: 30 mmol/L (ref 20–32)
Calcium: 9.3 mg/dL (ref 8.6–10.4)
Creat: 0.9 mg/dL (ref 0.50–0.99)
GFR, EST AFRICAN AMERICAN: 76 mL/min/{1.73_m2} (ref >=60)
GFR, Est Non African American: 65 mL/min/{1.73_m2} (ref >=60)
GLOBULIN: 2.9 g/dL (ref 1.9–3.7)
GLUCOSE: 92 mg/dL (ref 65–99)
Potassium: 4.1 mmol/L (ref 3.5–5.3)
SODIUM: 141 mmol/L (ref 135–146)
TOTAL PROTEIN: 7.2 g/dL (ref 6.1–8.1)

## 2017-09-07 LAB — LIPID PANEL
CHOL/HDL RATIO: 3.7 (calc) (ref <5.0)
CHOLESTEROL: 182 mg/dL (ref <200)
HDL: 49 mg/dL — AB (ref >50)
LDL Cholesterol (Calc): 109 mg/dL (calc) — ABNORMAL HIGH
Non-HDL Cholesterol (Calc): 133 mg/dL (calc) — ABNORMAL HIGH (ref <130)
TRIGLYCERIDES: 128 mg/dL (ref <150)

## 2017-09-07 LAB — TSH: TSH: 1.05 mIU/L (ref 0.40–4.50)

## 2017-09-07 LAB — HEPATITIS C ANTIBODY
Hepatitis C Ab: NONREACTIVE
SIGNAL TO CUT-OFF: 0.02 (ref <1.00)

## 2017-09-11 ENCOUNTER — Telehealth: Payer: Self-pay | Admitting: Nurse Practitioner

## 2017-09-12 NOTE — Telephone Encounter (Signed)
Paperwork has been sent to cologuard

## 2017-09-17 DIAGNOSIS — Z1212 Encounter for screening for malignant neoplasm of rectum: Secondary | ICD-10-CM | POA: Diagnosis not present

## 2017-09-17 DIAGNOSIS — Z1211 Encounter for screening for malignant neoplasm of colon: Secondary | ICD-10-CM | POA: Diagnosis not present

## 2017-09-21 ENCOUNTER — Other Ambulatory Visit: Payer: Self-pay

## 2017-09-21 ENCOUNTER — Telehealth: Payer: Self-pay

## 2017-09-21 DIAGNOSIS — I10 Essential (primary) hypertension: Secondary | ICD-10-CM

## 2017-09-21 DIAGNOSIS — E039 Hypothyroidism, unspecified: Secondary | ICD-10-CM

## 2017-09-21 DIAGNOSIS — E7800 Pure hypercholesterolemia, unspecified: Secondary | ICD-10-CM

## 2017-09-21 MED ORDER — PRAVASTATIN SODIUM 40 MG PO TABS: 40.0000 mg | ORAL_TABLET | Freq: Every day | ORAL | 1 refills | Status: DC

## 2017-09-21 MED ORDER — LEVOTHYROXINE SODIUM 88 MCG PO TABS: 88.0000 ug | ORAL_TABLET | Freq: Every day | ORAL | 1 refills | Status: DC

## 2017-09-21 MED ORDER — LOSARTAN POTASSIUM 25 MG PO TABS: 25.0000 mg | ORAL_TABLET | Freq: Every day | ORAL | 1 refills | Status: DC

## 2017-09-21 NOTE — Telephone Encounter (Signed)
Prescriptions were sent to [facility] but patient does not use this pharmacy and asked we reroute them to her correct pharmacy of [facility]. I called [facility] and cancel the 3 sent to them per patient request of Pravastatin, Losartan and Levothyroxine.

## 2017-09-21 NOTE — Telephone Encounter (Signed)
Copied from [place] [id number]. Topic: Inquiry >> Sep 21, 2017  7:40 AM Pricilla Handler wrote: Reason for CRM: Patient called requesting refills of Levothyroxine (SYNTHROID, LEVOTHROID) 88 MCG tablet, Losartan (COZAAR) 25 MG tablet, and Pravastatin (PRAVACHOL) 40 MG tablet. Patient's preferred pharmacy is the [facility], [place] [phone] (Phone)  [phone] (Fax).        Thank You!!!  Last office visit:  09/06/2017  Follow up visit: 12/13/2017  Lab Results  Component Value Date   TSH 1.05 09/06/2017    BP Readings from Last 3 Encounters:  09/06/17 126/76  04/11/17 136/84  01/22/17 124/82    Lab Results  Component Value Date   CREATININE 0.90 09/06/2017   BUN 19 09/06/2017   NA 141 09/06/2017   K 4.1 09/06/2017   CL 105 09/06/2017   CO2 30 09/06/2017    Lab Results  Component Value Date   CHOL 182 09/06/2017   HDL 49 (L) 09/06/2017   LDLCALC 109 (H) 09/06/2017   TRIG 128 09/06/2017   CHOLHDL 3.7 09/06/2017

## 2017-09-28 LAB — COLOGUARD
COLOGUARD: NEGATIVE
Cologuard: NEGATIVE
Cologuard: NEGATIVE

## 2017-10-19 ENCOUNTER — Ambulatory Visit: Payer: Self-pay

## 2017-10-19 DIAGNOSIS — M62830 Muscle spasm of back: Secondary | ICD-10-CM | POA: Diagnosis not present

## 2017-10-19 DIAGNOSIS — R319 Hematuria, unspecified: Secondary | ICD-10-CM | POA: Diagnosis not present

## 2017-10-19 NOTE — Telephone Encounter (Signed)
Pt. Reports she started having left back pain yesterday after pulling weeds in the yard. Took Advil and "that helped a little." No availability in the office today. Pt. Advised to go to UC. Verbalizes understanding.  Answer Assessment - Initial Assessment Questions 1. ONSET: "When did the pain begin?"      Yesterday 2. LOCATION: "Where does it hurt?" (upper, mid or lower back)     Lower left 3. SEVERITY: "How bad is the pain?"  (e.g., Scale 1-10; mild, moderate, or severe)   - MILD (1-3): doesn't interfere with normal activities    - MODERATE (4-7): interferes with normal activities or awakens from sleep    - SEVERE (8-10): excruciating pain, unable to do any normal activities      2 4. PATTERN: "Is the pain constant?" (e.g., yes, no; constant, intermittent)      Comes and goes 5. RADIATION: "Does the pain shoot into your legs or elsewhere?"     No 6. CAUSE:  "What do you think is causing the back pain?"      Unsure 7. BACK OVERUSE:  "Any recent lifting of heavy objects, strenuous work or exercise?"     No 8. MEDICATIONS: "What have you taken so far for the pain?" (e.g., nothing, acetaminophen, NSAIDS)     Advil 9. NEUROLOGIC SYMPTOMS: "Do you have any weakness, numbness, or problems with bowel/bladder control?"     No 10. OTHER SYMPTOMS: "Do you have any other symptoms?" (e.g., fever, abdominal pain, burning with urination, blood in urine)       No 11. PREGNANCY: "Is there any chance you are pregnant?" (e.g., yes, no; LMP)       No  Protocols used: BACK PAIN-A-AH

## 2017-11-23 ENCOUNTER — Telehealth: Payer: Self-pay | Admitting: Family Medicine

## 2017-11-23 NOTE — Telephone Encounter (Signed)
Patient declined to schedule.

## 2017-11-23 NOTE — Telephone Encounter (Signed)
Left message asking pt to call and schedule AWV w/ [facility]. Last AWV 11/20/16

## 2017-12-13 ENCOUNTER — Encounter: Payer: Medicare Other | Admitting: Family Medicine

## 2018-02-26 ENCOUNTER — Ambulatory Visit (INDEPENDENT_AMBULATORY_CARE_PROVIDER_SITE_OTHER): Payer: Medicare Other | Admitting: Family Medicine

## 2018-02-26 ENCOUNTER — Encounter: Payer: Self-pay | Admitting: Family Medicine

## 2018-02-26 VITALS — BP 118/78 | Temp 98.4°F | Resp 16 | Ht 65.0 in | Wt 180.6 lb

## 2018-02-26 DIAGNOSIS — R42 Dizziness and giddiness: Secondary | ICD-10-CM | POA: Diagnosis not present

## 2018-02-26 DIAGNOSIS — I10 Essential (primary) hypertension: Secondary | ICD-10-CM

## 2018-02-26 DIAGNOSIS — E039 Hypothyroidism, unspecified: Secondary | ICD-10-CM | POA: Diagnosis not present

## 2018-02-26 DIAGNOSIS — Z23 Encounter for immunization: Secondary | ICD-10-CM

## 2018-02-26 DIAGNOSIS — E7800 Pure hypercholesterolemia, unspecified: Secondary | ICD-10-CM

## 2018-02-26 DIAGNOSIS — Z1231 Encounter for screening mammogram for malignant neoplasm of breast: Secondary | ICD-10-CM

## 2018-02-26 DIAGNOSIS — Z0000 Encounter for general adult medical examination without abnormal findings: Secondary | ICD-10-CM | POA: Diagnosis not present

## 2018-02-26 DIAGNOSIS — E2839 Other primary ovarian failure: Secondary | ICD-10-CM | POA: Diagnosis not present

## 2018-02-26 MED ORDER — PRAVASTATIN SODIUM 40 MG PO TABS: 40.0000 mg | ORAL_TABLET | Freq: Every day | ORAL | 1 refills | Status: DC

## 2018-02-26 MED ORDER — LEVOTHYROXINE SODIUM 88 MCG PO TABS: 88.0000 ug | ORAL_TABLET | Freq: Every day | ORAL | 1 refills | Status: DC

## 2018-02-26 MED ORDER — LOSARTAN POTASSIUM 25 MG PO TABS: 12.5000 mg | ORAL_TABLET | Freq: Every day | ORAL | 1 refills | Status: DC

## 2018-02-26 NOTE — Patient Instructions (Signed)
Preventive Care 70 Years and Older, Female Preventive care refers to lifestyle choices and visits with your health care provider that can promote health and wellness. What does preventive care include?  A yearly physical exam. This is also called an annual well check.  Dental exams once or twice a year.  Routine eye exams. Ask your health care provider how often you should have your eyes checked.  Personal lifestyle choices, including: ? Daily care of your teeth and gums. ? Regular physical activity. ? Eating a healthy diet. ? Avoiding tobacco and drug use. ? Limiting alcohol use. ? Practicing safe sex. ? Taking low-dose aspirin every day. ? Taking vitamin and mineral supplements as recommended by your health care provider. What happens during an annual well check? The services and screenings done by your health care provider during your annual well check will depend on your age, overall health, lifestyle risk factors, and family history of disease. Counseling Your health care provider may ask you questions about your:  Alcohol use.  Tobacco use.  Drug use.  Emotional well-being.  Home and relationship well-being.  Sexual activity.  Eating habits.  History of falls.  Memory and ability to understand (cognition).  Work and work environment.  Reproductive health.  Screening You may have the following tests or measurements:  Height, weight, and BMI.  Blood pressure.  Lipid and cholesterol levels. These may be checked every 5 years, or more frequently if you are over 50 years old.  Skin check.  Lung cancer screening. You may have this screening every year starting at age 55 if you have a 30-pack-year history of smoking and currently smoke or have quit within the past 15 years.  Fecal occult blood test (FOBT) of the stool. You may have this test every year starting at age 50.  Flexible sigmoidoscopy or colonoscopy. You may have a sigmoidoscopy every 5 years or  a colonoscopy every 10 years starting at age 50.  Hepatitis C blood test.  Hepatitis B blood test.  Sexually transmitted disease (STD) testing.  Diabetes screening. This is done by checking your blood sugar (glucose) after you have not eaten for a while (fasting). You may have this done every 1-3 years.  Bone density scan. This is done to screen for osteoporosis. You may have this done starting at age 65.  Mammogram. This may be done every 1-2 years. Talk to your health care provider about how often you should have regular mammograms.  Talk with your health care provider about your test results, treatment options, and if necessary, the need for more tests. Vaccines Your health care provider may recommend certain vaccines, such as:  Influenza vaccine. This is recommended every year.  Tetanus, diphtheria, and acellular pertussis (Tdap, Td) vaccine. You may need a Td booster every 10 years.  Varicella vaccine. You may need this if you have not been vaccinated.  Zoster vaccine. You may need this after age 60.  Measles, mumps, and rubella (MMR) vaccine. You may need at least one dose of MMR if you were born in 1957 or later. You may also need a second dose.  Pneumococcal 13-valent conjugate (PCV13) vaccine. One dose is recommended after age 65.  Pneumococcal polysaccharide (PPSV23) vaccine. One dose is recommended after age 65.  Meningococcal vaccine. You may need this if you have certain conditions.  Hepatitis A vaccine. You may need this if you have certain conditions or if you travel or work in places where you may be exposed to hepatitis   A.  Hepatitis B vaccine. You may need this if you have certain conditions or if you travel or work in places where you may be exposed to hepatitis B.  Haemophilus influenzae type b (Hib) vaccine. You may need this if you have certain conditions.  Talk to your health care provider about which screenings and vaccines you need and how often you  need them. This information is not intended to replace advice given to you by your health care provider. Make sure you discuss any questions you have with your health care provider. Document Released: 05/14/2015 Document Revised: 01/05/2016 Document Reviewed: 02/16/2015 Elsevier Interactive Patient Education  2018 Elsevier Inc.  

## 2018-02-26 NOTE — Progress Notes (Signed)
Patient: Paige [name], Female    DOB: 05/30/1945, 72 y.o.   MRN: [id number]  Visit Date: 02/26/2018  Today's Provider: Loistine Chance, MD   Chief Complaint  Patient presents with  . Medicare Wellness    Subjective:    HPI Wilna Pennie Nylin is a 72 y.o. female who presents today for her Subsequent Annual Wellness Visit.  Patient/Caregiver input:  Also needs follow up  Hyperlipidemia: she takes pravastatin and denies chest pain or myalgia.   HTN: she has noticed dizziness when she stands up in am, no chest pain or palpitation . BP towards low end of normal today and we will decrease dose of losartan from 25 to 12.5   Hypothyroidism: she has been on levothyroxine for 88 mcg, TSH has been at goal for a long time. She states always had thin hair and dry skin. No recent change in bowel movements.   Review of Systems  Constitutional: Negative for fever or weight change.  Respiratory: Negative for cough and shortness of breath.   Cardiovascular: Negative for chest pain or palpitations.  Gastrointestinal: Negative for abdominal pain, no bowel changes.  Musculoskeletal: Negative for gait problem or joint swelling.  Skin: Negative for rash.  Neurological: positive for intermittent dizziness but no headache.  No other specific complaints in a complete review of systems (except as listed in HPI above).  Past Medical History:  Diagnosis Date  . Hyperlipidemia   . Hypertension   . Thyroid disease     History reviewed. No pertinent surgical history.  Family History  Problem Relation Age of Onset  . Cancer Mother   . Diabetes Father   . Stroke Father     Social History   Socioeconomic History  . Marital status: Married    Spouse name: Not on file  . Number of children: 0  . Years of education: Not on file  . Highest education level: High school graduate  Occupational History  . Occupation: Retired  . Occupation: Armed forces operational officer  Social Needs  . Financial  resource strain: Not hard at all  . Food insecurity:    Worry: Never true    Inability: Never true  . Transportation needs:    Medical: No    Non-medical: No  Tobacco Use  . Smoking status: Former Smoker    Packs/day: 0.25    Years: 5.00    Pack years: 1.25    Types: Cigarettes    Start date: 05/01/1972    Last attempt to quit: 05/01/1977    Years since quitting: 40.8  . Smokeless tobacco: Never Used  Substance and Sexual Activity  . Alcohol use: No  . Drug use: No  . Sexual activity: Yes    Partners: Male  Lifestyle  . Physical activity:    Days per week: 3 days    Minutes per session: 60 min  . Stress: Not on file  Relationships  . Social connections:    Talks on phone: More than three times a week    Gets together: More than three times a week    Attends religious service: More than 4 times per year    Active member of club or organization: Yes    Attends meetings of clubs or organizations: More than 4 times per year    Relationship status: Married  . Intimate partner violence:    Fear of current or ex partner: No    Emotionally abused: No    Physically abused: No    Forced  sexual activity: No  Other Topics Concern  . Not on file  Social History Narrative   Second marriage, widow from first marriage at age 63 and her current husband had two teenagers who she help raise     Outpatient Encounter Medications as of 02/26/2018  Medication Sig Note  . aspirin 81 MG tablet Take 1 tablet by mouth daily. 10/13/2014: Received from: [facility]: Take by mouth.  . Calcium-Phosphorus-Vitamin D 100-50-100 MG-MG-UNIT CHEW Chew 1 tablet by mouth. 10/13/2014: Received from: [facility]: Chew by mouth.  . fluticasone (FLONASE) 50 MCG/ACT nasal spray Place 1 spray into both nostrils daily.   Marland Kitchen levothyroxine (SYNTHROID, LEVOTHROID) 88 MCG tablet Take 1 tablet (88 mcg total) by mouth daily before breakfast.   . losartan  (COZAAR) 25 MG tablet Take 0.5 tablets (12.5 mg total) by mouth daily.   . MULTIPLE VITAMIN PO Take by mouth daily. 10/13/2014: Received from: [facility]: Take by mouth.  . Multiple Vitamins-Minerals (HAIR SKIN AND NAILS FORMULA PO) Take by mouth.   . Omega-3 Fatty Acids (FISH OIL BURP-LESS) 1200 MG CAPS Take 1 capsule by mouth. 10/13/2014: Received from: [facility]: Take by mouth.  . pravastatin (PRAVACHOL) 40 MG tablet Take 1 tablet (40 mg total) by mouth daily.   . [DISCONTINUED] levothyroxine (SYNTHROID, LEVOTHROID) 88 MCG tablet Take 1 tablet (88 mcg total) by mouth daily before breakfast.   . [DISCONTINUED] losartan (COZAAR) 25 MG tablet Take 1 tablet (25 mg total) by mouth daily.   . [DISCONTINUED] pravastatin (PRAVACHOL) 40 MG tablet Take 1 tablet (40 mg total) by mouth daily.    No facility-administered encounter medications on file as of 02/26/2018.     Allergies  Allergen Reactions  . Codeine Nausea And Vomiting    Care Team Updated in EHR: Yes  Last Vision Exam: September 2018 at [facility] Wears corrective lenses: Yes Last Dental Exam: July 2019  Last Hearing Exam: September 2018 Wears Hearing Aids: No  Functional Ability / Safety Screening 1.  Was the timed Get Up and Go test shorter than 30 seconds?  yes 2.  Does the patient need help with the phone, transportation, shopping,      preparing meals, housework, laundry, medications, or managing money?  no 3.  Is the patient's home free of loose throw rugs in walkways, pet beds, electrical cords, etc?   yes      Grab bars in the bathroom? no      Handrails on the stairs?   yes      Adequate lighting?   yes 4.  Has the patient noticed any hearing difficulties?   yes left ear  Diet Recall and Exercise Regimen:   Advanced Care Planning: A voluntary discussion about advance care planning including the explanation and discussion of advance directives.   Discussed health care proxy and Living will, and the patient was able to identify a health care proxy as husband .  Patient does not have a living will at present time.  Does patient have a HCPOA?    no If yes, name and contact information:  Does patient have a living will or MOST form?  no  Cancer Screenings: Skin: discussed atypical lesions  Lung:  Low Dose CT Chest recommended if Age 64-80 years, 30 pack-year currently smoking OR have quit w/in 15years. Patient does not qualify. Breast:  Up to date on Mammogram? No  Up to date of  Bone Density/Dexa? No Colon: check results of cologuard   Additional Screenings:  Hepatitis B/HIV/Syphillis: N/A Hepatitis C Screening: negative  Intimate Partner Violence: negative   Objective:   Vitals: BP 118/78 (BP Location: Right Arm, Patient Position: Sitting, Cuff Size: Large)   Temp 98.4 F (36.9 C) (Oral)   Resp 16   Ht 5\' 5"  (1.651 m)   Wt 180 lb 9.6 oz (81.9 kg)   SpO2 99%   BMI 30.05 kg/m  Body mass index is 30.05 kg/m.  No exam data present  Physical Exam   Constitutional: Patient appears well-developed and well-nourished. Obese No distress.  HEENT: head atraumatic, normocephalic, pupils equal and reactive to light,  neck supple, throat within normal limits, no thyromegaly  Cardiovascular: Normal rate, regular rhythm and normal heart sounds.  No murmur heard. No BLE edema. Pulmonary/Chest: Effort normal and breath sounds normal. No respiratory distress. Abdominal: Soft.  There is no tenderness. Psychiatric: Patient has a normal mood and affect. behavior is normal. Judgment and thought content normal.  Cognitive Testing - 6-CIT  Correct? Score   What year is it? yes 0 Yes = 0    No = 4  What month is it? yes 0 Yes = 0    No = 3  Remember:     Pia Mau, [place], [place]     What time is it? yes 0 Yes = 0    No = 3  Count backwards from 20 to 1 yes 0 Correct = 0    1 error = 2   More than 1 error = 4  Say the months of  the year in reverse. yes 0 Correct = 0    1 error = 2   More than 1 error = 4  What address did I ask you to remember? yes 0 Correct = 0  1 error = 2    2 error = 4    3 error = 6    4 error = 8    All wrong = 10       TOTAL SCORE  0/28   Interpretation:  Normal  Normal (0-7) Abnormal (8-28)   Fall Risk: Fall Risk  02/26/2018 04/11/2017 01/15/2017 11/20/2016 09/06/2016  Falls in the past year? Yes No No No No  Number falls in past yr: 1 - - - -  Injury with Fall? No - - - -    Depression Screen Depression screen [facility] 2/9 04/11/2017 01/15/2017 11/20/2016 09/06/2016 11/10/2015  Decreased Interest 0 0 0 0 0  Down, Depressed, Hopeless 0 0 0 0 0  PHQ - 2 Score 0 0 0 0 0    No results found for this or any previous visit (from the past 2160 hour(s)).  Assessment & Plan:  1. Hypothyroidism, unspecified type  - levothyroxine (SYNTHROID, LEVOTHROID) 88 MCG tablet; Take 1 tablet (88 mcg total) by mouth daily before breakfast.  Dispense: 90 tablet; Refill: 1  2. Essential hypertension  - losartan (COZAAR) 25 MG tablet; Take 0.5 tablets (12.5 mg total) by mouth daily.  Dispense: 45 tablet; Refill: 1 - EKG  3. Need for influenza vaccination  - Flu vaccine HIGH DOSE PF (Fluzone High dose)  4. Pure hypercholesterolemia  - pravastatin (PRAVACHOL) 40 MG tablet; Take 1 tablet (40 mg total) by mouth daily.  Dispense: 90 tablet; Refill: 1 - EKG  5. Ovarian failure  - HM DEXA SCAN  6. Encounter for screening mammogram for breast cancer  -  MM Digital Screening; Future  7. Need for vaccination for pneumococcus  - Pneumococcal polysaccharide vaccine 23-valent greater than or equal to 2yo subcutaneous/IM  8. Medicare annual wellness visit, subsequent   9. Dizziness  Decrease bp to 12.5 mg daily and recheck bp in one week with CMA  Exercise Activities and Dietary recommendations Goals    . Increase water intake     Recommend increasing water intake to 6-8 glasses a day.       -  Discussed health benefits of physical activity, and encouraged her to engage in regular exercise appropriate for her age and condition.   Immunization History  Administered Date(s) Administered  . Influenza, High Dose Seasonal PF 03/16/2015, 02/26/2018  . Influenza-Unspecified 03/16/2017  . Pneumococcal Conjugate-13 11/20/2016  . Pneumococcal Polysaccharide-23 05/01/2012    Health Maintenance  Topic Date Due  . COLONOSCOPY  11/05/1997  . PNA vac Low Risk Adult (2 of 2 - PPSV23) 11/20/2017  . MAMMOGRAM  02/06/2019  . INFLUENZA VACCINE  Completed  . DEXA SCAN  Completed  . Hepatitis C Screening  Completed  . TETANUS/TDAP  Discontinued    Meds ordered this encounter  Medications  . levothyroxine (SYNTHROID, LEVOTHROID) 88 MCG tablet    Sig: Take 1 tablet (88 mcg total) by mouth daily before breakfast.    Dispense:  90 tablet    Refill:  1  . losartan (COZAAR) 25 MG tablet    Sig: Take 0.5 tablets (12.5 mg total) by mouth daily.    Dispense:  45 tablet    Refill:  1  . pravastatin (PRAVACHOL) 40 MG tablet    Sig: Take 1 tablet (40 mg total) by mouth daily.    Dispense:  90 tablet    Refill:  1    Current Outpatient Medications:  .  aspirin 81 MG tablet, Take 1 tablet by mouth daily., Disp: , Rfl:  .  Calcium-Phosphorus-Vitamin D 100-50-100 MG-MG-UNIT CHEW, Chew 1 tablet by mouth., Disp: , Rfl:  .  fluticasone (FLONASE) 50 MCG/ACT nasal spray, Place 1 spray into both nostrils daily., Disp: 16 g, Rfl: 3 .  levothyroxine (SYNTHROID, LEVOTHROID) 88 MCG tablet, Take 1 tablet (88 mcg total) by mouth daily before breakfast., Disp: 90 tablet, Rfl: 1 .  losartan (COZAAR) 25 MG tablet, Take 0.5 tablets (12.5 mg total) by mouth daily., Disp: 45 tablet, Rfl: 1 .  MULTIPLE VITAMIN PO, Take by mouth daily., Disp: , Rfl:  .  Multiple Vitamins-Minerals (HAIR SKIN AND NAILS FORMULA PO), Take by mouth., Disp: , Rfl:  .  Omega-3 Fatty Acids (FISH OIL BURP-LESS) 1200 MG CAPS, Take 1 capsule by  mouth., Disp: , Rfl:  .  pravastatin (PRAVACHOL) 40 MG tablet, Take 1 tablet (40 mg total) by mouth daily., Disp: 90 tablet, Rfl: 1 Medications Discontinued During This Encounter  Medication Reason  . levothyroxine (SYNTHROID, LEVOTHROID) 88 MCG tablet Reorder  . losartan (COZAAR) 25 MG tablet Reorder  . pravastatin (PRAVACHOL) 40 MG tablet Reorder    I have personally reviewed and addressed the Medicare Annual Wellness health risk assessment questionnaire and have noted the following in the patient's chart:  A.         Medical and social history & family history B.         Use of alcohol, tobacco, and illicit drugs  C.         Current medications and supplements D.         Functional and Cognitive ability and status  E.         Nutritional status F.         Physical activity G.        Advance directives H.         List of other physicians I.          Hospitalizations, surgeries, and ER visits in previous 12 months J.         [place] such as hearing, vision, cognitive function, and depression L.         Referrals and appointments: for mammogram and bone density   In addition, I have reviewed and discussed with patient certain preventive protocols, quality metrics, and best practice recommendations.    See attached scanned questionnaire for additional information.   No follow-ups on file.

## 2018-02-27 ENCOUNTER — Telehealth: Payer: Self-pay

## 2018-02-27 NOTE — Telephone Encounter (Signed)
Negative Cologuard on 09/28/2017. Results have been updated in chart.

## 2018-03-05 ENCOUNTER — Ambulatory Visit: Payer: Medicare Other

## 2018-03-05 VITALS — BP 122/64

## 2018-03-05 DIAGNOSIS — I10 Essential (primary) hypertension: Secondary | ICD-10-CM

## 2018-03-05 NOTE — Progress Notes (Addendum)
Patient here for blood pressure check since Dr. Ancil Boozer cut Losartan in half.  Patient denies any side effects and blood pressure today is 122/64.  We will continue current regimen unless Dr. Ancil Boozer states otherwise.  Continue current dose

## 2018-03-08 ENCOUNTER — Other Ambulatory Visit: Payer: Self-pay | Admitting: Nurse Practitioner

## 2018-03-08 DIAGNOSIS — E039 Hypothyroidism, unspecified: Secondary | ICD-10-CM

## 2018-03-15 ENCOUNTER — Other Ambulatory Visit: Payer: Self-pay

## 2018-04-05 ENCOUNTER — Telehealth: Payer: Self-pay

## 2018-04-05 NOTE — Telephone Encounter (Signed)
Tried to contact patient to see if she was still taking Losartan 25 mg and Pravastatin 40 mg for her insurance. This is a medication non-adherence therapy advisory. No answer, CRM created in case she calls back.

## 2018-04-10 ENCOUNTER — Other Ambulatory Visit: Payer: Self-pay | Admitting: Family Medicine

## 2018-04-10 DIAGNOSIS — E039 Hypothyroidism, unspecified: Secondary | ICD-10-CM

## 2018-04-10 DIAGNOSIS — E7800 Pure hypercholesterolemia, unspecified: Secondary | ICD-10-CM

## 2018-04-10 DIAGNOSIS — I10 Essential (primary) hypertension: Secondary | ICD-10-CM

## 2018-04-10 NOTE — Telephone Encounter (Signed)
Copied from [place] [id number]. Topic: Quick Communication - Rx Refill/Question >> Apr 10, 2018  8:25 AM Keene Breath wrote: Medication: levothyroxine (SYNTHROID, LEVOTHROID) 88 MCG tablet / losartan (COZAAR) 25 MG tablet / pravastatin (PRAVACHOL) 40 MG tablet  Patient called to request a refill for the above medications.  Call patient if there is a problem  CB# [phone]  Preferred Pharmacy (with phone number or street name): [facility], [place] [phone] (Phone) [phone] (Fax)

## 2018-04-10 NOTE — Telephone Encounter (Signed)
Paige [name] - it looks like Dr. Ancil Boozer sent in 6 month supply in October.  Please verify with pharmacy. If no refills, please provide verbal orders.

## 2018-04-11 DIAGNOSIS — R0982 Postnasal drip: Secondary | ICD-10-CM | POA: Diagnosis not present

## 2018-04-11 DIAGNOSIS — R040 Epistaxis: Secondary | ICD-10-CM | POA: Diagnosis not present

## 2018-04-25 MED ORDER — PRAVASTATIN SODIUM 40 MG PO TABS: 40.0000 mg | ORAL_TABLET | Freq: Every day | ORAL | 1 refills | Status: DC

## 2018-04-25 NOTE — Telephone Encounter (Signed)
Pt calling back stating that she never had gotten a 6 month supply and that she has no pills on the Pravastatin and 6 pills only on the other two she would like to use the  [id number] [address], [place] [phone] (Phone) [phone] (Fax)

## 2018-04-25 NOTE — Addendum Note (Signed)
Addended by: Chilton Greathouse on: 04/25/2018 09:45 AM   Modules accepted: Orders

## 2018-04-25 NOTE — Addendum Note (Signed)
Addended by: Hubbard Hartshorn on: 04/25/2018 10:32 AM   Modules accepted: Orders

## 2018-05-13 ENCOUNTER — Ambulatory Visit
Admission: RE | Admit: 2018-05-13 | Discharge: 2018-05-13 | Disposition: A | Discharge status: Home / Self Care | Payer: Medicare Other | Source: Ambulatory Visit | Attending: Family Medicine | Admitting: Family Medicine

## 2018-05-13 DIAGNOSIS — Z780 Asymptomatic menopausal state: Secondary | ICD-10-CM | POA: Diagnosis not present

## 2018-05-13 DIAGNOSIS — Z1231 Encounter for screening mammogram for malignant neoplasm of breast: Secondary | ICD-10-CM | POA: Diagnosis not present

## 2018-05-13 DIAGNOSIS — E2839 Other primary ovarian failure: Secondary | ICD-10-CM | POA: Diagnosis not present

## 2018-05-13 DIAGNOSIS — M85852 Other specified disorders of bone density and structure, left thigh: Secondary | ICD-10-CM | POA: Diagnosis not present

## 2018-08-07 ENCOUNTER — Telehealth: Payer: Self-pay | Admitting: Emergency Medicine

## 2018-08-07 NOTE — Telephone Encounter (Signed)
Left a message with Dr. Ancil Boozer recommendation on work and dealing with the COVID-19:  She does not have any medical reasons not to work, only her age, and that is on the [employer], since she is part time, she can just explain to her employer that she does not feel comfortable

## 2018-08-07 NOTE — Telephone Encounter (Signed)
Copied from [place] [id number]. Topic: General - Other >> Aug 06, 2018  4:38 PM Wynetta Emery, [place] C wrote: Reason for CRM: pt called in to be advised by her PCP. Pt says that she work part time at the [employer]. Pt says that they called her to come in to work and help. Pt would like to be advised by her provider on if she should go or not?    CB: [phone]

## 2018-08-07 NOTE — Telephone Encounter (Signed)
I have only met this patient 1 time in September 2018.  She did see Dr. Ancil Boozer for routine follow up on 02/26/2018, so I will defer to Dr. Ancil Boozer.

## 2018-08-28 ENCOUNTER — Encounter: Payer: Self-pay | Admitting: Family Medicine

## 2018-08-28 ENCOUNTER — Ambulatory Visit (INDEPENDENT_AMBULATORY_CARE_PROVIDER_SITE_OTHER): Payer: Medicare Other | Admitting: Family Medicine

## 2018-08-28 ENCOUNTER — Other Ambulatory Visit: Payer: Self-pay

## 2018-08-28 DIAGNOSIS — E039 Hypothyroidism, unspecified: Secondary | ICD-10-CM | POA: Diagnosis not present

## 2018-08-28 DIAGNOSIS — M8580 Other specified disorders of bone density and structure, unspecified site: Secondary | ICD-10-CM

## 2018-08-28 DIAGNOSIS — E7800 Pure hypercholesterolemia, unspecified: Secondary | ICD-10-CM

## 2018-08-28 DIAGNOSIS — J3089 Other allergic rhinitis: Secondary | ICD-10-CM

## 2018-08-28 DIAGNOSIS — I10 Essential (primary) hypertension: Secondary | ICD-10-CM

## 2018-08-28 NOTE — Progress Notes (Signed)
Name: Paige [name]   MRN: [id number]    DOB: Nov 23, 1945   Date:08/28/2018       Progress Note  Subjective  Chief Complaint  Chief Complaint  Patient presents with  . Follow-up    I connected with  Paige [name]  on 08/28/18 at  8:00 AM EDT by a video enabled telemedicine application and verified that I am speaking with the correct person using two identifiers.  I discussed the limitations of evaluation and management by telemedicine and the availability of in person appointments. The patient expressed understanding and agreed to proceed. Staff also discussed with the patient that there may be a patient responsible charge related to this service. Patient Location: Home Provider Location: Home Additional Individuals present: None  HPI  Hyperlipidemia: she takes pravastatin and OTC fish oil and denies chest pain or myalgia. Taking 81mg  ASA daily.  HTN:She had Losartan cut in half at last visit due to dizziness, and has not had any symptoms since dose change.  No chest pain, shortness of breath, BLE edema, or palpitation.  Hypothyroidism: she has been on levothyroxine for 88 mcg, TSH has been at goal for a long time. She states always had thin hair and dry skin. No recent change in bowel movements.   Allergies: Taking flonase PRN.  She has been out in the yard a lot more lately.  Takes OTC antihistamine PRN as well.  Osteopenia: Decided not to take medication at this time; she is taking OTC vitamin D and calcium supplements, is very active and usually goes to the gym regularly (unable to do so now due to [place] 19), is doing a lot of work around the house right now.  Patient Active Problem List   Diagnosis Date Noted  . Hyperlipidemia 10/27/2014  . Allergic rhinitis 10/27/2014  . Hypertension, benign 10/27/2014  . Adult hypothyroidism 10/13/2014    No past surgical history on file.  Family History  Problem Relation Age of Onset  . Cancer Mother   . Diabetes  Father   . Stroke Father   . Breast cancer Neg Hx     Social History   Socioeconomic History  . Marital status: Married    Spouse name: Not on file  . Number of children: 0  . Years of education: Not on file  . Highest education level: High school graduate  Occupational History  . Occupation: Retired  . Occupation: Armed forces operational officer  Social Needs  . Financial resource strain: Not hard at all  . Food insecurity:    Worry: Never true    Inability: Never true  . Transportation needs:    Medical: No    Non-medical: No  Tobacco Use  . Smoking status: Former Smoker    Packs/day: 0.25    Years: 5.00    Pack years: 1.25    Types: Cigarettes    Start date: 05/01/1972    Last attempt to quit: 05/01/1977    Years since quitting: 41.3  . Smokeless tobacco: Never Used  Substance and Sexual Activity  . Alcohol use: No  . Drug use: No  . Sexual activity: Yes    Partners: Male  Lifestyle  . Physical activity:    Days per week: 3 days    Minutes per session: 60 min  . Stress: Not on file  Relationships  . Social connections:    Talks on phone: More than three times a week    Gets together: More than three times a week  Attends religious service: More than 4 times per year    Active member of club or organization: Yes    Attends meetings of clubs or organizations: More than 4 times per year    Relationship status: Married  . Intimate partner violence:    Fear of current or ex partner: No    Emotionally abused: No    Physically abused: No    Forced sexual activity: No  Other Topics Concern  . Not on file  Social History Narrative   Second marriage, widow from first marriage at age 46 and her current husband had two teenagers who she help raise      Current Outpatient Medications:  .  aspirin 81 MG tablet, Take 1 tablet by mouth daily., Disp: , Rfl:  .  Calcium-Phosphorus-Vitamin D 100-50-100 MG-MG-UNIT CHEW, Chew 1 tablet by mouth., Disp: , Rfl:  .  fluticasone (FLONASE) 50  MCG/ACT nasal spray, Place 1 spray into both nostrils daily., Disp: 16 g, Rfl: 3 .  levothyroxine (SYNTHROID, LEVOTHROID) 88 MCG tablet, Take 1 tablet (88 mcg total) by mouth daily before breakfast., Disp: 90 tablet, Rfl: 1 .  losartan (COZAAR) 25 MG tablet, Take 0.5 tablets (12.5 mg total) by mouth daily., Disp: 45 tablet, Rfl: 1 .  MULTIPLE VITAMIN PO, Take by mouth daily., Disp: , Rfl:  .  Multiple Vitamins-Minerals (HAIR SKIN AND NAILS FORMULA PO), Take by mouth., Disp: , Rfl:  .  Omega-3 Fatty Acids (FISH OIL BURP-LESS) 1200 MG CAPS, Take 1 capsule by mouth., Disp: , Rfl:  .  pravastatin (PRAVACHOL) 40 MG tablet, Take 1 tablet (40 mg total) by mouth daily., Disp: 90 tablet, Rfl: 1  Allergies  Allergen Reactions  . Codeine Nausea And Vomiting    I personally reviewed active problem list, medication list, allergies, notes from last encounter, lab results with the patient/caregiver today.   ROS  Constitutional: Negative for fever or weight change.  Respiratory: Negative for cough and shortness of breath.   Cardiovascular: Negative for chest pain or palpitations.  Gastrointestinal: Negative for abdominal pain, no bowel changes.  Musculoskeletal: Negative for gait problem or joint swelling.  Skin: Negative for rash.  Neurological: Negative for dizziness or headache.  No other specific complaints in a complete review of systems (except as listed in HPI above).  Objective  Virtual encounter, vitals not obtained.  There is no height or weight on file to calculate BMI.  Physical Exam Constitutional: Patient appears well-developed and well-nourished. No distress.  HENT: Head: Normocephalic and atraumatic.  Neck: Normal range of motion. Pulmonary/Chest: Effort normal. No respiratory distress. Speaking in complete sentences Neurological: Pt is alert and oriented to person, place, and time. Coordination, speech and gait are normal.  Psychiatric: Patient has a normal mood and affect.  behavior is normal. Judgment and thought content normal.  No results found for this or any previous visit (from the past 72 hour(s)).  PHQ2/9: Depression screen [facility] 2/9 04/11/2017 01/15/2017 11/20/2016 09/06/2016 11/10/2015  Decreased Interest 0 0 0 0 0  Down, Depressed, Hopeless 0 0 0 0 0  PHQ - 2 Score 0 0 0 0 0   PHQ-2/9 Result is negative.    Fall Risk: Fall Risk  08/28/2018 03/15/2018 02/26/2018 04/11/2017 01/15/2017  Falls in the past year? 0 1 Yes No No  Comment - Emmi Telephone Survey: data to providers prior to load - - -  Number falls in past yr: 0 1 1 - -  Comment - Emmi Telephone Survey Actual  Response = 1 - - -  Injury with Fall? 0 0 No - -  Follow up Falls evaluation completed - - - -    Assessment & Plan  1. Essential hypertension - DASH diet recommended; UTA BP at this time, but was low end of normal at last visit and is tolerating 1/2 dose of losartan without any issues. - COMPLETE METABOLIC PANEL WITH GFR  2. Seasonal allergic rhinitis due to other allergic trigger - Taking OTC medications  3. Osteopenia, unspecified location - Repeat DEXA in 2022, continue supplements of Vitamin D and calcium, weight bearing exercise, calcium in diet.  4. Adult hypothyroidism - Stable on current dose for many years. - TSH  5. Pure hypercholesterolemia - Lipid panel  I discussed the assessment and treatment plan with the patient. The patient was provided an opportunity to ask questions and all were answered. The patient agreed with the plan and demonstrated an understanding of the instructions.  The patient was advised to call back or seek an in-person evaluation if the symptoms worsen or if the condition fails to improve as anticipated.  I provided 16 minutes of non-face-to-face time during this encounter.

## 2018-09-02 ENCOUNTER — Telehealth: Payer: Self-pay

## 2018-09-02 DIAGNOSIS — E7800 Pure hypercholesterolemia, unspecified: Secondary | ICD-10-CM | POA: Diagnosis not present

## 2018-09-02 DIAGNOSIS — E039 Hypothyroidism, unspecified: Secondary | ICD-10-CM | POA: Diagnosis not present

## 2018-09-02 DIAGNOSIS — I10 Essential (primary) hypertension: Secondary | ICD-10-CM | POA: Diagnosis not present

## 2018-09-02 LAB — COMPLETE METABOLIC PANEL WITH GFR
AG Ratio: 1.5 (calc) (ref 1.0–2.5)
ALT: 15 U/L (ref 6–29)
AST: 18 U/L (ref 10–35)
Albumin: 4.3 g/dL (ref 3.6–5.1)
Alkaline phosphatase (APISO): 40 U/L (ref 37–153)
BUN: 20 mg/dL (ref 7–25)
CO2: 29 mmol/L (ref 20–32)
Calcium: 9.3 mg/dL (ref 8.6–10.4)
Chloride: 106 mmol/L (ref 98–110)
Creat: 0.83 mg/dL (ref 0.60–0.93)
GFR, Est African American: 83 mL/min/{1.73_m2} (ref >=60)
GFR, Est Non African American: 71 mL/min/{1.73_m2} (ref >=60)
Globulin: 2.8 g/dL (calc) (ref 1.9–3.7)
Glucose, Bld: 100 mg/dL — ABNORMAL HIGH (ref 65–99)
Potassium: 4 mmol/L (ref 3.5–5.3)
Sodium: 141 mmol/L (ref 135–146)
Total Bilirubin: 0.7 mg/dL (ref 0.2–1.2)
Total Protein: 7.1 g/dL (ref 6.1–8.1)

## 2018-09-02 LAB — LIPID PANEL
Cholesterol: 169 mg/dL (ref <200)
HDL: 48 mg/dL — ABNORMAL LOW (ref >=50)
LDL Cholesterol (Calc): 101 mg/dL (calc) — ABNORMAL HIGH
Non-HDL Cholesterol (Calc): 121 mg/dL (calc) (ref <130)
Total CHOL/HDL Ratio: 3.5 (calc) (ref <5.0)
Triglycerides: 102 mg/dL (ref <150)

## 2018-09-02 LAB — TSH: TSH: 1.47 mIU/L (ref 0.40–4.50)

## 2018-09-02 NOTE — Telephone Encounter (Signed)
Reviewed no changes

## 2018-09-02 NOTE — Telephone Encounter (Signed)
Pt came in for blood work and wanted her bp checked: 134/84

## 2018-10-22 ENCOUNTER — Other Ambulatory Visit: Payer: Self-pay | Admitting: Family Medicine

## 2018-10-22 DIAGNOSIS — E039 Hypothyroidism, unspecified: Secondary | ICD-10-CM

## 2018-10-22 DIAGNOSIS — I10 Essential (primary) hypertension: Secondary | ICD-10-CM

## 2018-11-11 DIAGNOSIS — H2513 Age-related nuclear cataract, bilateral: Secondary | ICD-10-CM | POA: Diagnosis not present

## 2018-11-27 ENCOUNTER — Ambulatory Visit: Payer: Medicare Other | Admitting: Family Medicine

## 2019-01-13 DIAGNOSIS — L821 Other seborrheic keratosis: Secondary | ICD-10-CM | POA: Diagnosis not present

## 2019-02-05 ENCOUNTER — Ambulatory Visit: Payer: Medicare Other | Admitting: Family Medicine

## 2019-02-11 ENCOUNTER — Ambulatory Visit (INDEPENDENT_AMBULATORY_CARE_PROVIDER_SITE_OTHER): Payer: Medicare Other | Admitting: Family Medicine

## 2019-02-11 ENCOUNTER — Other Ambulatory Visit: Payer: Self-pay

## 2019-02-11 ENCOUNTER — Encounter: Payer: Self-pay | Admitting: Family Medicine

## 2019-02-11 VITALS — BP 124/80 | HR 75 | Temp 97.9°F | Resp 14 | Ht 65.0 in | Wt 181.6 lb

## 2019-02-11 DIAGNOSIS — I10 Essential (primary) hypertension: Secondary | ICD-10-CM | POA: Diagnosis not present

## 2019-02-11 DIAGNOSIS — M8580 Other specified disorders of bone density and structure, unspecified site: Secondary | ICD-10-CM | POA: Diagnosis not present

## 2019-02-11 DIAGNOSIS — E039 Hypothyroidism, unspecified: Secondary | ICD-10-CM

## 2019-02-11 DIAGNOSIS — E7800 Pure hypercholesterolemia, unspecified: Secondary | ICD-10-CM

## 2019-02-11 DIAGNOSIS — J3089 Other allergic rhinitis: Secondary | ICD-10-CM | POA: Diagnosis not present

## 2019-02-11 DIAGNOSIS — Z6379 Other stressful life events affecting family and household: Secondary | ICD-10-CM

## 2019-02-11 DIAGNOSIS — Z23 Encounter for immunization: Secondary | ICD-10-CM | POA: Diagnosis not present

## 2019-02-11 NOTE — Patient Instructions (Signed)
Paige [name] - is a service in [place] that manages group home patients

## 2019-02-11 NOTE — Progress Notes (Signed)
Name: Paige [name]   MRN: [id number]    DOB: November 28, 1945   Date:02/11/2019       Progress Note  Subjective  Chief Complaint  Chief Complaint  Patient presents with  . Hypertension    follow up  . Hypothyroidism  . Hyperlipidemia    HPI  Hyperlipidemia: she takes pravastatin and OTC fish oil and denies chest pain or myalgia. Taking 81mg  ASA daily. Stable and unchanged.   HTN: She had Losartan cut in half at last visit due to dizziness, and has not had any symptoms since dose change.  No chest pain, shortness of breath, BLE edema, or palpitation. Last CMP was normal.  Hypothyroidism: she has been on levothyroxine for 88 mcg for many years. TSH has been at goal for a long time. She states always had thin hair and dry skin. No recent change in bowel movements, palpitations, heat/cold intolerance.   Allergies: Taking flonase PRN.  Takes OTC antihistamine PRN as well.  Still has some post nasal drip.   Osteopenia: Decided not to take medication at this time; she is taking OTC vitamin D and calcium supplements, is very active and usually goes to the gym regularly (unable to do so now due to [place] 19), is doing a lot of work around the house right now. Most recent bone density was 05/13/2018. She is taking Vitamin D supplement.    Patient Active Problem List   Diagnosis Date Noted  . Essential hypertension 08/28/2018  . Osteopenia 08/28/2018  . Hyperlipidemia 10/27/2014  . Allergic rhinitis 10/27/2014  . Hypertension, benign 10/27/2014  . Adult hypothyroidism 10/13/2014    No past surgical history on file.  Family History  Problem Relation Age of Onset  . Cancer Mother   . Diabetes Father   . Stroke Father   . Breast cancer Neg Hx     Social History   Socioeconomic History  . Marital status: Married    Spouse name: Not on file  . Number of children: 0  . Years of education: Not on file  . Highest education level: High school graduate  Occupational History   . Occupation: Retired  . Occupation: Armed forces operational officer  Social Needs  . Financial resource strain: Not hard at all  . Food insecurity    Worry: Never true    Inability: Never true  . Transportation needs    Medical: No    Non-medical: No  Tobacco Use  . Smoking status: Former Smoker    Packs/day: 0.25    Years: 5.00    Pack years: 1.25    Types: Cigarettes    Start date: 05/01/1972    Quit date: 05/01/1977    Years since quitting: 41.8  . Smokeless tobacco: Never Used  Substance and Sexual Activity  . Alcohol use: No  . Drug use: No  . Sexual activity: Yes    Partners: Male  Lifestyle  . Physical activity    Days per week: 3 days    Minutes per session: 60 min  . Stress: Not on file  Relationships  . Social connections    Talks on phone: More than three times a week    Gets together: More than three times a week    Attends religious service: More than 4 times per year    Active member of club or organization: Yes    Attends meetings of clubs or organizations: More than 4 times per year    Relationship status: Married  . Intimate  partner violence    Fear of current or ex partner: No    Emotionally abused: No    Physically abused: No    Forced sexual activity: No  Other Topics Concern  . Not on file  Social History Narrative   Second marriage, widow from first marriage at age 54 and her current husband had two teenagers who she help raise      Current Outpatient Medications:  .  aspirin 81 MG tablet, Take 1 tablet by mouth daily., Disp: , Rfl:  .  Calcium-Phosphorus-Vitamin D 100-50-100 MG-MG-UNIT CHEW, Chew 1 tablet by mouth., Disp: , Rfl:  .  cyanocobalamin 100 MCG tablet, Take 100 mcg by mouth daily., Disp: , Rfl:  .  fluticasone (FLONASE) 50 MCG/ACT nasal spray, Place 1 spray into both nostrils daily., Disp: 16 g, Rfl: 3 .  levothyroxine (SYNTHROID) 88 MCG tablet, TAKE 1 TABLET DAILY BEFORE BREAKFAST, Disp: 90 tablet, Rfl: 1 .  losartan (COZAAR) 25 MG tablet, TAKE  1/2 TABLET (12.5MG     TOTAL) DAILY, Disp: 45 tablet, Rfl: 1 .  MULTIPLE VITAMIN PO, Take by mouth daily., Disp: , Rfl:  .  Multiple Vitamins-Minerals (HAIR SKIN AND NAILS FORMULA PO), Take by mouth., Disp: , Rfl:  .  Omega-3 Fatty Acids (FISH OIL BURP-LESS) 1200 MG CAPS, Take 1 capsule by mouth., Disp: , Rfl:  .  pravastatin (PRAVACHOL) 40 MG tablet, Take 1 tablet (40 mg total) by mouth daily., Disp: 90 tablet, Rfl: 1  Allergies  Allergen Reactions  . Codeine Nausea And Vomiting    I personally reviewed active problem list, medication list, allergies, health maintenance, notes from last encounter, lab results with the patient/caregiver today.   ROS  Constitutional: Negative for fever or weight change.  Respiratory: Negative for cough and shortness of breath.   Cardiovascular: Negative for chest pain or palpitations.  Gastrointestinal: Negative for abdominal pain, no bowel changes.  Musculoskeletal: Negative for gait problem or joint swelling.  Skin: Negative for rash.  Neurological: Negative for dizziness or headache.  No other specific complaints in a complete review of systems (except as listed in HPI above).  Objective  Vitals:   02/11/19 0735  BP: 124/80  Pulse: 75  Resp: 14  Temp: 97.9 F (36.6 C)  TempSrc: Oral  SpO2: 93%  Weight: 181 lb 9.6 oz (82.4 kg)  Height: 5\' 5"  (1.651 m)    Body mass index is 30.22 kg/m.  Physical Exam Constitutional: Patient appears well-developed and well-nourished. No distress.  HENT: Head: Normocephalic and atraumatic. Eyes: Conjunctivae and EOM are normal. No scleral icterus.  Pupils are equal, round, and reactive to light.  Neck: Normal range of motion. Neck supple. No JVD present. No thyromegaly present.  Cardiovascular: Normal rate, regular rhythm and normal heart sounds.  No murmur heard. No BLE edema. Pulmonary/Chest: Effort normal and breath sounds normal. No respiratory distress. Musculoskeletal: Normal range of motion, no  joint effusions. No gross deformities Neurological: Pt is alert and oriented to person, place, and time. No cranial nerve deficit. Coordination, balance, strength, speech and gait are normal.  Skin: Skin is warm and dry. No rash noted. No erythema.  Psychiatric: Patient has a normal mood and affect. behavior is normal. Judgment and thought content normal.  No results found for this or any previous visit (from the past 72 hour(s)).   PHQ2/9: Depression screen [facility] 2/9 04/11/2017 01/15/2017 11/20/2016 09/06/2016 11/10/2015  Decreased Interest 0 0 0 0 0  Down, Depressed, Hopeless 0 0 0 0  0  PHQ - 2 Score 0 0 0 0 0   PHQ-2/9 Result is negative.    Fall Risk: Fall Risk  08/28/2018 03/15/2018 02/26/2018 04/11/2017 01/15/2017  Falls in the past year? 0 1 Yes No No  Comment - Emmi Telephone Survey: data to providers prior to load - - -  Number falls in past yr: 0 1 1 - -  Comment - Emmi Telephone Survey Actual Response = 1 - - -  Injury with Fall? 0 0 No - -  Follow up Falls evaluation completed - - - -    Assessment & Plan  1. Essential hypertension - Stable - COMPLETE METABOLIC PANEL WITH GFR  2. Osteopenia, unspecified location - Discussed medications in detail; will maximize vitamin D and exercise.  3. Adult hypothyroidism - Stable on her current dosing for years - TSH  4. Pure hypercholesterolemia - Stable on current medications. - Lipid panel  5. Seasonal allergic rhinitis due to other allergic trigger - Stable on flonase  6. Stress due to illness of family member - Son is inpatient with mental health issues and attempted suicide in [place], she needs help moving him to [place] to better care for him and hopefully place in group home. - Ambulatory referral to Chronic Care Management Services  7. Need for influenza vaccination - Flu Vaccine QUAD High Dose(Fluad)

## 2019-02-17 ENCOUNTER — Ambulatory Visit: Payer: Self-pay | Admitting: *Deleted

## 2019-02-17 NOTE — Chronic Care Management (AMB) (Signed)
  Chronic Care Management   Social Work Note  02/17/2019 Name: Barrett Duchemin MRN: [id number] DOB: Sep 17, 1945  Paige [name] is a 73 y.o. year old female who sees Hubbard Hartshorn, FNP for primary care. The CCM team was consulted for assistance with Caregiver Stress.   Per patient, her step son is currently receiving treatment in a behavioral health hospital in [place]. She and her husband are making efforts to move him to [place] to live in a group home near them and wanted advice on the move. Patient's step-son has Medicaid. This Education officer, museum suggested that patient work with the discharge planner at the hospital through [facility] to help set up services for her step-son post discharge from their facility. This Education officer, museum further recommended working with the [facility] to get assistance with  patient's Medicaid being transferred to [facility]. Contacting [facility] also suggested to assist with the group home process as well as linkage to community resources upon his arrival. Contact number to [facility] [phone] also provided for additional community support.    Outpatient Encounter Medications as of 02/17/2019  Medication Sig Note  . aspirin 81 MG tablet Take 1 tablet by mouth daily. 10/13/2014: Received from: [place]: Take by mouth.  . Calcium-Phosphorus-Vitamin D 100-50-100 MG-MG-UNIT CHEW Chew 1 tablet by mouth. 10/13/2014: Received from: [place]: Chew by mouth.  . cyanocobalamin 100 MCG tablet Take 100 mcg by mouth daily.   . fluticasone (FLONASE) 50 MCG/ACT nasal spray Place 1 spray into both nostrils daily.   [place] levothyroxine (SYNTHROID) 88 MCG tablet TAKE 1 TABLET DAILY BEFORE BREAKFAST   . losartan (COZAAR) 25 MG tablet TAKE 1/2 TABLET (12.5MG     TOTAL) DAILY   . MULTIPLE VITAMIN PO Take by mouth daily. 10/13/2014: Received from: [place]: Take by mouth.  . Multiple Vitamins-Minerals (HAIR SKIN AND NAILS FORMULA PO) Take by mouth.   . Omega-3 Fatty Acids (FISH OIL BURP-LESS) 1200 MG CAPS Take 1 capsule by mouth. 10/13/2014: Received from: [place]: Take by mouth.  . pravastatin (PRAVACHOL) 40 MG tablet Take 1 tablet (40 mg total) by mouth daily.    No facility-administered encounter medications on file as of 02/17/2019.     Goals Addressed   None     Follow Up Plan: Client will contact this social worker with any additional community resource needs in the future   [place], [place] Worker  [facility] Care Management [phone]

## 2019-03-04 ENCOUNTER — Ambulatory Visit (INDEPENDENT_AMBULATORY_CARE_PROVIDER_SITE_OTHER): Payer: Medicare Other

## 2019-03-04 VITALS — Temp 98.7°F | Ht 65.0 in | Wt 182.0 lb

## 2019-03-04 DIAGNOSIS — Z0000 Encounter for general adult medical examination without abnormal findings: Secondary | ICD-10-CM

## 2019-03-04 DIAGNOSIS — Z1231 Encounter for screening mammogram for malignant neoplasm of breast: Secondary | ICD-10-CM | POA: Diagnosis not present

## 2019-03-04 NOTE — Progress Notes (Signed)
Subjective:   Paige [name] is a 73 y.o. female who presents for Medicare Annual (Subsequent) preventive examination.  Virtual Visit via Telephone Note  I connected with Paige [name] on 03/04/19 at  2:10 PM EST by telephone and verified that I am speaking with the correct person using two identifiers.  Medicare Annual Wellness visit completed telephonically due to Covid-19 pandemic.   Location: Patient: home Provider: office   I discussed the limitations, risks, security and privacy concerns of performing an evaluation and management service by telephone and the availability of in person appointments. The patient expressed understanding and agreed to proceed.  Some vital signs may be absent or patient reported.   Clemetine Marker, LPN    Review of Systems:   Cardiac Risk Factors include: advanced age (>35men, >11 women);hypertension;obesity (BMI >30kg/m2)     Objective:     Vitals: Temp 98.7 F (37.1 C)   Ht 5\' 5"  (1.651 m)   Wt 182 lb (82.6 kg)   BMI 30.29 kg/m   Body mass index is 30.29 kg/m.  Advanced Directives 03/04/2019 01/22/2017 01/15/2017 11/20/2016 09/06/2016 11/10/2015 08/16/2015  Does Patient Have a Medical Advance Directive? No No No No No No No  Would patient like information on creating a medical advance directive? Yes (MAU/Ambulatory/Procedural Areas - Information given) - - Yes (ED - Information included in AVS) - No - patient declined information No - patient declined information    Tobacco Social History   Tobacco Use  Smoking Status Former Smoker  . Packs/day: 0.25  . Years: 5.00  . Pack years: 1.25  . Types: Cigarettes  . Start date: 05/01/1972  . Quit date: 05/01/1977  . Years since quitting: 41.8  Smokeless Tobacco Never Used     Counseling given: Not Answered   Clinical Intake:  Pre-visit preparation completed: Yes  Pain : No/denies pain     BMI - recorded: 30.29 Nutritional Status: BMI > 30  Obese Nutritional Risks:  None Diabetes: No  How often do you need to have someone help you when you read instructions, pamphlets, or other written materials from your doctor or pharmacy?: 1 - Never  Interpreter Needed?: No  Information entered by :: Clemetine Marker LPN  Past Medical History:  Diagnosis Date  . Hyperlipidemia   . Hypertension   . Thyroid disease    History reviewed. No pertinent surgical history. Family History  Problem Relation Age of Onset  . Cancer Mother   . Diabetes Father   . Stroke Father   . Breast cancer Neg Hx    Social History   Socioeconomic History  . Marital status: Married    Spouse name: Not on file  . Number of children: 0  . Years of education: Not on file  . Highest education level: High school graduate  Occupational History  . Occupation: Retired  . Occupation: Armed forces operational officer  Social Needs  . Financial resource strain: Not hard at all  . Food insecurity    Worry: Never true    Inability: Never true  . Transportation needs    Medical: No    Non-medical: No  Tobacco Use  . Smoking status: Former Smoker    Packs/day: 0.25    Years: 5.00    Pack years: 1.25    Types: Cigarettes    Start date: 05/01/1972    Quit date: 05/01/1977    Years since quitting: 41.8  . Smokeless tobacco: Never Used  Substance and Sexual Activity  . Alcohol  use: No  . Drug use: No  . Sexual activity: Yes    Partners: Male  Lifestyle  . Physical activity    Days per week: 3 days    Minutes per session: 60 min  . Stress: Not at all  Relationships  . Social connections    Talks on phone: More than three times a week    Gets together: More than three times a week    Attends religious service: More than 4 times per year    Active member of club or organization: Yes    Attends meetings of clubs or organizations: More than 4 times per year    Relationship status: Married  Other Topics Concern  . Not on file  Social History Narrative   Second marriage, widow from first marriage  at age 47 and her current husband had two teenagers who she help raise     Outpatient Encounter Medications as of 03/04/2019  Medication Sig  . aspirin 81 MG tablet Take 1 tablet by mouth daily.  . Calcium-Phosphorus-Vitamin D 100-50-100 MG-MG-UNIT CHEW Chew 1 tablet by mouth.  . cyanocobalamin 100 MCG tablet Take 100 mcg by mouth daily.  . fluticasone (FLONASE) 50 MCG/ACT nasal spray Place 1 spray into both nostrils daily.  Marland Kitchen levothyroxine (SYNTHROID) 88 MCG tablet TAKE 1 TABLET DAILY BEFORE BREAKFAST  . losartan (COZAAR) 25 MG tablet TAKE 1/2 TABLET (12.5MG     TOTAL) DAILY  . MULTIPLE VITAMIN PO Take by mouth daily.  . Multiple Vitamins-Minerals (HAIR SKIN AND NAILS FORMULA PO) Take by mouth.  . Omega-3 Fatty Acids (FISH OIL BURP-LESS) 1200 MG CAPS Take 1 capsule by mouth.  . pravastatin (PRAVACHOL) 40 MG tablet Take 1 tablet (40 mg total) by mouth daily.   No facility-administered encounter medications on file as of 03/04/2019.     Activities of Daily Living In your present state of health, do you have any difficulty performing the following activities: 03/04/2019  Hearing? N  Comment declines hearing aids  Vision? N  Difficulty concentrating or making decisions? N  Walking or climbing stairs? N  Dressing or bathing? N  Doing errands, shopping? N  Preparing Food and eating ? N  Using the Toilet? N  In the past six months, have you accidently leaked urine? Y  Comment occasional urge incontinence. wears pads for protection  Do you have problems with loss of bowel control? N  Managing your Medications? N  Managing your Finances? N  Housekeeping or managing your Housekeeping? N  Some recent data might be hidden    Patient Care Team: Hubbard Hartshorn, FNP as PCP - General (Family Medicine) Birder Robson, MD as Referring Physician (Ophthalmology) Carloyn Manner, MD as Referring Physician (Otolaryngology)    Assessment:   This is a routine wellness examination for Paige [name].   Exercise Activities and Dietary recommendations Current Exercise Habits: Structured exercise class, Type of exercise: Other - see comments(water aerobics), Time (Minutes): 60, Frequency (Times/Week): 3, Weekly Exercise (Minutes/Week): 180, Intensity: Moderate, Exercise limited by: None identified  Goals    . Increase water intake     Recommend increasing water intake to 6-8 glasses a day.        Fall Risk Fall Risk  03/04/2019 02/11/2019 08/28/2018 03/15/2018 02/26/2018  Falls in the past year? 0 0 0 1 Yes  Comment - - - Emmi Telephone Survey: data to providers prior to load -  Number falls in past yr: 0 0 0 1 1  Comment - - -  Emmi Telephone Survey Actual Response = 1 -  Injury with Fall? 0 0 0 0 No  Follow up Falls prevention discussed Falls evaluation completed Falls evaluation completed - -   FALL RISK PREVENTION PERTAINING TO THE HOME:  Any stairs in or around the home? Yes  If so, do they handrails? Yes   Home free of loose throw rugs in walkways, pet beds, electrical cords, etc? Yes  Adequate lighting in your home to reduce risk of falls? Yes   ASSISTIVE DEVICES UTILIZED TO PREVENT FALLS:  Life alert? No  Use of a cane, walker or w/c? No  Grab bars in the bathroom? No  Shower chair or bench in shower? No  Elevated toilet seat or a handicapped toilet? No   DME ORDERS:  DME order needed?  No   TIMED UP AND GO:  Was the test performed? No . Telephonic visit.   Education: Fall risk prevention has been discussed.  Intervention(s) required? No    Depression Screen PHQ 2/9 Scores 03/04/2019 02/11/2019 04/11/2017 01/15/2017  PHQ - 2 Score 0 0 0 0  PHQ- 9 Score - 0 - -     Cognitive Function 6CIT deferred for 2020 AWV. Pt has no memory issues.      6CIT Screen 02/26/2018 11/20/2016  What Year? 0 points 0 points  What month? 0 points 0 points  What time? 0 points 0 points  Count back from 20 0 points 0 points  Months in reverse 0 points 0 points  Repeat phrase  0 points 0 points  Total Score 0 0    Immunization History  Administered Date(s) Administered  . Fluad Quad(high Dose 65+) 02/11/2019  . Influenza, High Dose Seasonal PF 03/16/2015, 02/26/2018  . Influenza-Unspecified 03/16/2017  . Pneumococcal Conjugate-13 11/20/2016  . Pneumococcal Polysaccharide-23 05/01/2012, 02/26/2018    Qualifies for Shingles Vaccine? Yes . Due for Shingrix. Education has been provided regarding the importance of this vaccine. Pt has been advised to call insurance company to determine out of pocket expense. Advised may also receive vaccine at local pharmacy or Health Dept. Verbalized acceptance and understanding.  Tdap: Although this vaccine is not a covered service during a Wellness Exam, does the patient still wish to receive this vaccine today?  No .  Education has been provided regarding the importance of this vaccine. Advised may receive this vaccine at local pharmacy or Health Dept. Aware to provide a copy of the vaccination record if obtained from local pharmacy or Health Dept. Verbalized acceptance and understanding.  Flu Vaccine: Up to date  Pneumococcal Vaccine: Up to date   Screening Tests Health Maintenance  Topic Date Due  . MAMMOGRAM  05/14/2019  . DEXA SCAN  05/13/2020  . Fecal DNA (Cologuard)  09/28/2020  . INFLUENZA VACCINE  Completed  . Hepatitis C Screening  Completed  . PNA vac Low Risk Adult  Completed  . TETANUS/TDAP  Discontinued    Cancer Screenings:  Colorectal Screening: Cologuard completed 09/28/17. Repeat every 3 years.  Mammogram: Completed 05/13/18. Repeat every year. Ordered today. Pt provided with contact information and advised to call to schedule appt.   Bone Density: Completed 05/13/18. Results reflect  OSTEOPENIA. Repeat every 2 years.   Lung Cancer Screening: (Low Dose CT Chest recommended if Age 39-80 years, 30 pack-year currently smoking OR have quit w/in 15years.) does not qualify.   Additional Screening:   Hepatitis C Screening: does qualify; Completed 09/06/17  Vision Screening: Recommended annual ophthalmology exams for early detection of  glaucoma and other disorders of the eye. Is the patient up to date with their annual eye exam?  Yes  Who is the provider or what is the name of the office in which the pt attends annual eye exams? [facility] Screening: Recommended annual dental exams for proper oral hygiene  Community Resource Referral:  CRR required this visit?  No      Plan:     I have personally reviewed and addressed the Medicare Annual Wellness questionnaire and have noted the following in the patient's chart:  A. Medical and social history B. Use of alcohol, tobacco or illicit drugs  C. Current medications and supplements D. Functional ability and status E.  Nutritional status F.  Physical activity G. Advance directives H. List of other physicians I.  Hospitalizations, surgeries, and ER visits in previous 12 months J.  [place] such as hearing and vision if needed, cognitive and depression L. Referrals and appointments   In addition, I have reviewed and discussed with patient certain preventive protocols, quality metrics, and best practice recommendations. A written personalized care plan for preventive services as well as general preventive health recommendations were provided to patient.   Signed,  Clemetine Marker, LPN Nurse Health Advisor   Nurse Notes: none

## 2019-03-04 NOTE — Patient Instructions (Signed)
Paige [name] , Thank you for taking time to come for your Medicare Wellness Visit. I appreciate your ongoing commitment to your health goals. Please review the following plan we discussed and let me know if I can assist you in the future.   Screening recommendations/referrals: Colonoscopy: Cologuard done 09/28/17. Repeat in 2022. Mammogram: done 05/13/18. Please call [phone] to schedule your mammogram.  Bone Density: done 05/13/18. Repeat in 2022. Recommended yearly ophthalmology/optometry visit for glaucoma screening and checkup Recommended yearly dental visit for hygiene and checkup  Vaccinations: Influenza vaccine: done 02/11/19 Pneumococcal vaccine: done 02/26/18 Tdap vaccine: due - please contact us if you get a cut or scrape on rust or metal Shingles vaccine: Shingrix discussed. Please contact your pharmacy for coverage information.   Advanced directives: Advance directive discussed with you today. I have provided a copy for you to complete at home and have notarized. Once this is complete please bring a copy in to our office so we can scan it into your chart.  Conditions/risks identified: Recommend drinking 6-8 glasses of water per day.   Next appointment: Please follow up in one year for your Medicare Annual Wellness visit.     Preventive Care 71 Years and Older, Female Preventive care refers to lifestyle choices and visits with your health care provider that can promote health and wellness. What does preventive care include?  A yearly physical exam. This is also called an annual well check.  Dental exams once or twice a year.  Routine eye exams. Ask your health care provider how often you should have your eyes checked.  Personal lifestyle choices, including:  Daily care of your teeth and gums.  Regular physical activity.  Eating a healthy diet.  Avoiding tobacco and drug use.  Limiting alcohol use.  Practicing safe sex.  Taking low-dose aspirin every  day.  Taking vitamin and mineral supplements as recommended by your health care provider. What happens during an annual well check? The services and screenings done by your health care provider during your annual well check will depend on your age, overall health, lifestyle risk factors, and family history of disease. Counseling  Your health care provider may ask you questions about your:  Alcohol use.  Tobacco use.  Drug use.  Emotional well-being.  Home and relationship well-being.  Sexual activity.  Eating habits.  History of falls.  Memory and ability to understand (cognition).  Work and work Statistician.  Reproductive health. Screening  You may have the following tests or measurements:  Height, weight, and BMI.  Blood pressure.  Lipid and cholesterol levels. These may be checked every 5 years, or more frequently if you are over 25 years old.  Skin check.  Lung cancer screening. You may have this screening every year starting at age 5 if you have a 30-pack-year history of smoking and currently smoke or have quit within the past 15 years.  Fecal occult blood test (FOBT) of the stool. You may have this test every year starting at age 18.  Flexible sigmoidoscopy or colonoscopy. You may have a sigmoidoscopy every 5 years or a colonoscopy every 10 years starting at age 19.  Hepatitis C blood test.  Hepatitis B blood test.  Sexually transmitted disease (STD) testing.  Diabetes screening. This is done by checking your blood sugar (glucose) after you have not eaten for a while (fasting). You may have this done every 1-3 years.  Bone density scan. This is done to screen for osteoporosis. You may have this  done starting at age 52.  Mammogram. This may be done every 1-2 years. Talk to your health care provider about how often you should have regular mammograms. Talk with your health care provider about your test results, treatment options, and if necessary, the need  for more tests. Vaccines  Your health care provider may recommend certain vaccines, such as:  Influenza vaccine. This is recommended every year.  Tetanus, diphtheria, and acellular pertussis (Tdap, Td) vaccine. You may need a Td booster every 10 years.  Zoster vaccine. You may need this after age 56.  Pneumococcal 13-valent conjugate (PCV13) vaccine. One dose is recommended after age 42.  Pneumococcal polysaccharide (PPSV23) vaccine. One dose is recommended after age 63. Talk to your health care provider about which screenings and vaccines you need and how often you need them. This information is not intended to replace advice given to you by your health care provider. Make sure you discuss any questions you have with your health care provider. Document Released: 05/14/2015 Document Revised: 01/05/2016 Document Reviewed: 02/16/2015 Elsevier Interactive Patient Education  2017 [place] Prevention in the Home Falls can cause injuries. They can happen to people of all ages. There are many things you can do to make your home safe and to help prevent falls. What can I do on the outside of my home?  Regularly fix the edges of walkways and driveways and fix any cracks.  Remove anything that might make you trip as you walk through a door, such as a raised step or threshold.  Trim any bushes or trees on the path to your home.  Use bright outdoor lighting.  Clear any walking paths of anything that might make someone trip, such as rocks or tools.  Regularly check to see if handrails are loose or broken. Make sure that both sides of any steps have handrails.  Any raised decks and porches should have guardrails on the edges.  Have any leaves, snow, or ice cleared regularly.  Use sand or salt on walking paths during winter.  Clean up any spills in your garage right away. This includes oil or grease spills. What can I do in the bathroom?  Use night lights.  Install grab bars  by the toilet and in the tub and shower. Do not use towel bars as grab bars.  Use non-skid mats or decals in the tub or shower.  If you need to sit down in the shower, use a plastic, non-slip stool.  Keep the floor dry. Clean up any water that spills on the floor as soon as it happens.  Remove soap buildup in the tub or shower regularly.  Attach bath mats securely with double-sided non-slip rug tape.  Do not have throw rugs and other things on the floor that can make you trip. What can I do in the bedroom?  Use night lights.  Make sure that you have a light by your bed that is easy to reach.  Do not use any sheets or blankets that are too big for your bed. They should not hang down onto the floor.  Have a firm chair that has side arms. You can use this for support while you get dressed.  Do not have throw rugs and other things on the floor that can make you trip. What can I do in the kitchen?  Clean up any spills right away.  Avoid walking on wet floors.  Keep items that you use a lot in easy-to-reach places.  If you need to reach something above you, use a strong step stool that has a grab bar.  Keep electrical cords out of the way.  Do not use floor polish or wax that makes floors slippery. If you must use wax, use non-skid floor wax.  Do not have throw rugs and other things on the floor that can make you trip. What can I do with my stairs?  Do not leave any items on the stairs.  Make sure that there are handrails on both sides of the stairs and use them. Fix handrails that are broken or loose. Make sure that handrails are as long as the stairways.  Check any carpeting to make sure that it is firmly attached to the stairs. Fix any carpet that is loose or worn.  Avoid having throw rugs at the top or bottom of the stairs. If you do have throw rugs, attach them to the floor with carpet tape.  Make sure that you have a light switch at the top of the stairs and the  bottom of the stairs. If you do not have them, ask someone to add them for you. What else can I do to help prevent falls?  Wear shoes that:  Do not have high heels.  Have rubber bottoms.  Are comfortable and fit you well.  Are closed at the toe. Do not wear sandals.  If you use a stepladder:  Make sure that it is fully opened. Do not climb a closed stepladder.  Make sure that both sides of the stepladder are locked into place.  Ask someone to hold it for you, if possible.  Clearly mark and make sure that you can see:  Any grab bars or handrails.  First and last steps.  Where the edge of each step is.  Use tools that help you move around (mobility aids) if they are needed. These include:  Canes.  Walkers.  Scooters.  Crutches.  Turn on the lights when you go into a dark area. Replace any light bulbs as soon as they burn out.  Set up your furniture so you have a clear path. Avoid moving your furniture around.  If any of your floors are uneven, fix them.  If there are any pets around you, be aware of where they are.  Review your medicines with your doctor. Some medicines can make you feel dizzy. This can increase your chance of falling. Ask your doctor what other things that you can do to help prevent falls. This information is not intended to replace advice given to you by your health care provider. Make sure you discuss any questions you have with your health care provider. Document Released: 02/11/2009 Document Revised: 09/23/2015 Document Reviewed: 05/22/2014 Elsevier Interactive Patient Education  2017 [employer].

## 2019-03-17 ENCOUNTER — Other Ambulatory Visit: Payer: Self-pay | Admitting: Family Medicine

## 2019-03-17 DIAGNOSIS — E039 Hypothyroidism, unspecified: Secondary | ICD-10-CM

## 2019-03-17 DIAGNOSIS — E7800 Pure hypercholesterolemia, unspecified: Secondary | ICD-10-CM

## 2019-03-17 DIAGNOSIS — I10 Essential (primary) hypertension: Secondary | ICD-10-CM

## 2019-06-02 IMAGING — MR MR BRAIN/IAC WO/W
12 series · 37 of 48 positions shown · IV contrast (multihance)
Comparison: None.

CLINICAL DATA: Dizziness and nausea with tinnitus right worse than
left, 5 years duration.

EXAM:
MRI HEAD WITHOUT AND WITH CONTRAST
TECHNIQUE: Multiplanar, multiecho pulse sequences of the brain and surrounding
structures were obtained without and with intravenous contrast.
CONTRAST:  17mL MULTIHANCE GADOBENATE DIMEGLUMINE 529 MG/ML IV SOLN

[Series 2: T1 · sagittal · 5.0mm · 0.45mm/px · 2 of 27 slices shown (1 of 3)]
[im 1/27]
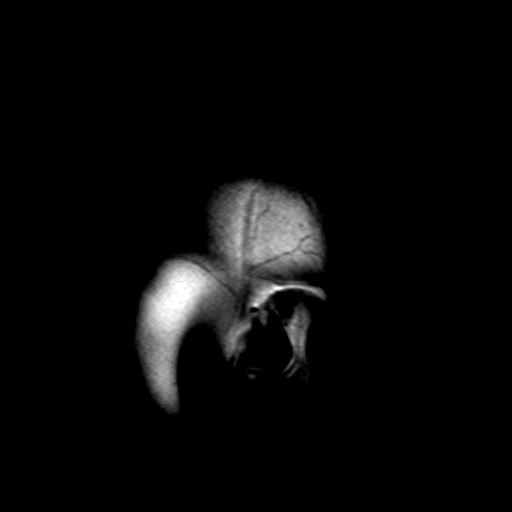
[im 27/27]
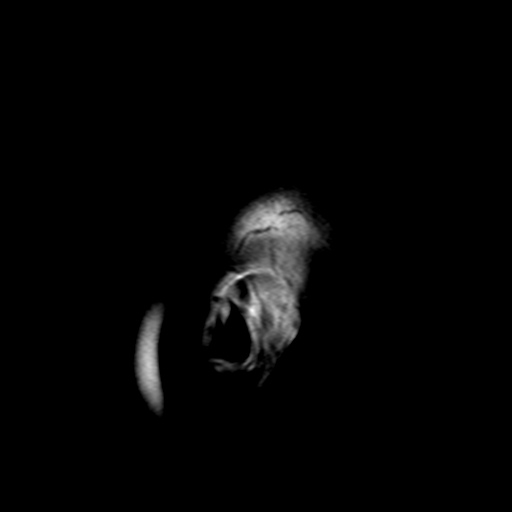

[Series 4: DWI · axial · 4.0mm · 0.94mm/px · z∈[-70,+97]mm · 4 of 43 slices shown]
[im 1/43]
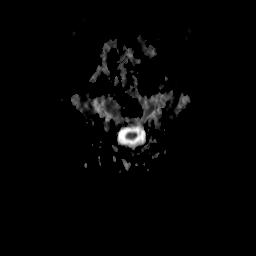
[im 15/43]
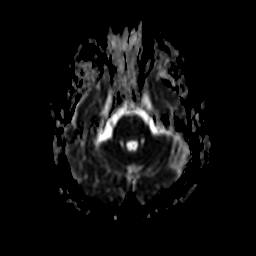
[im 29/43]
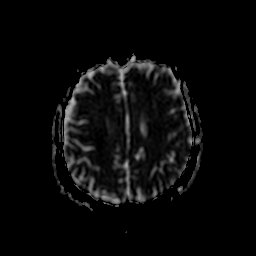
[im 43/43]
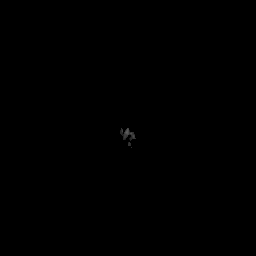

[Series 5: T2 · axial · 5.1mm · 0.45mm/px · z∈[-60,+96]mm · 2 of 23 slices shown (1 of 2)]
[im 1/23]
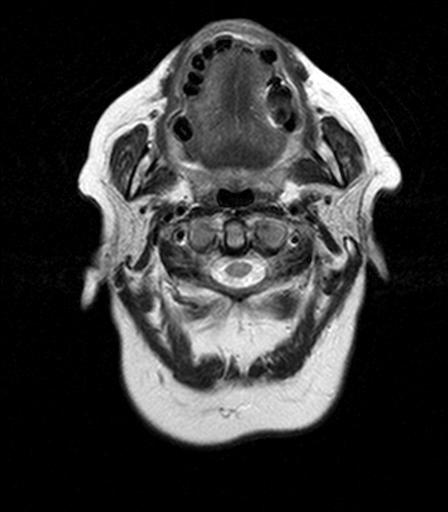
[im 23/23]
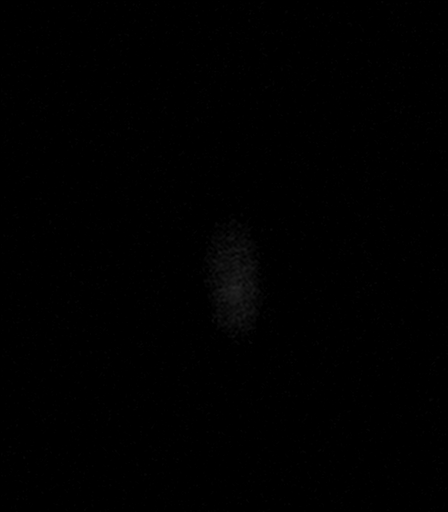

[Series 6: FLAIR · axial · 3.0mm · 0.90mm/px · z∈[-54,+91]mm · 5 of 50 slices shown]
[im 1/50]
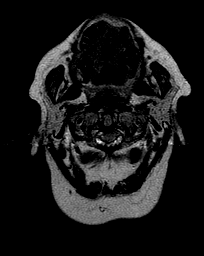
[im 13/50]
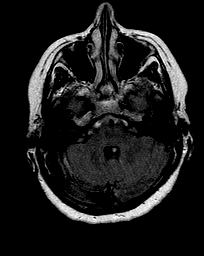
[im 25/50]
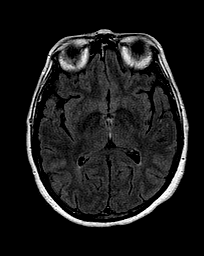
[im 37/50]
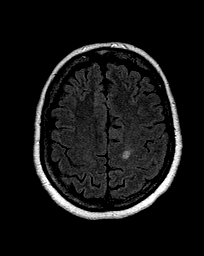
[im 50/50]
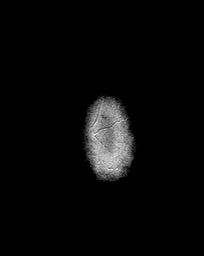

[Series 7: ax (id) · axial · 4.0mm · 0.94mm/px · z∈[-70,+93]mm · 4 of 42 slices shown]
[im 1/42]
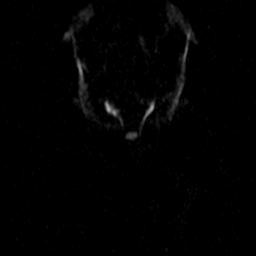
[im 14/42]
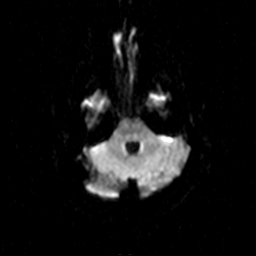
[im 28/42]
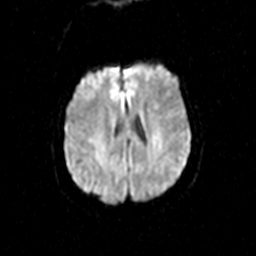
[im 42/42]
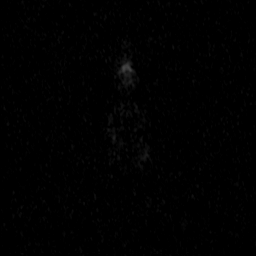

[Series 8: T2 · axial · 5.0mm · 0.45mm/px · z∈[-55,+98]mm · 3 of 27 slices shown (2 of 2)]
[im 1/27]
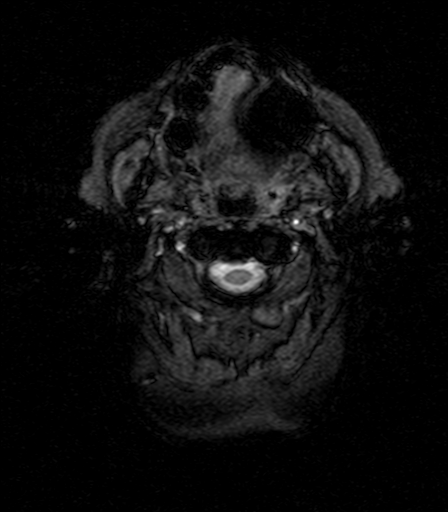
[im 14/27]
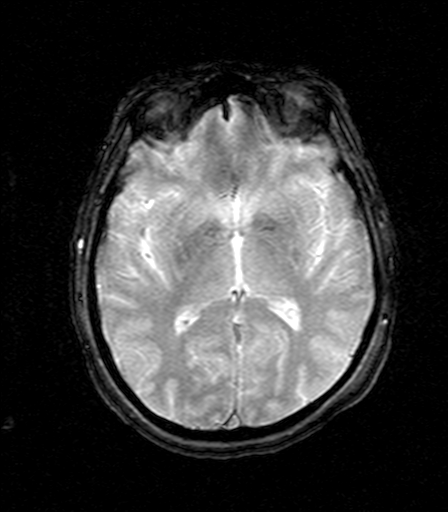
[im 27/27]
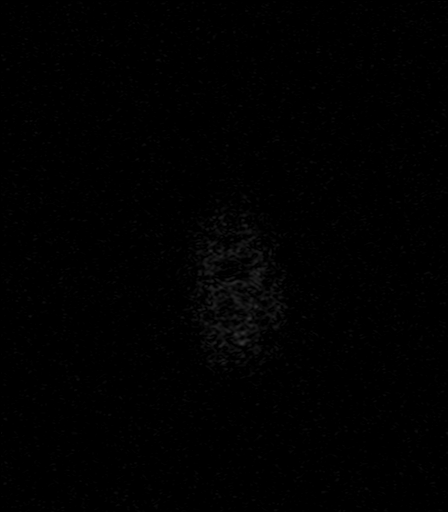

[Series 9: T1 · coronal · 3.0mm · 0.70mm/px · 2 of 19 slices shown (2 of 3)]
[im 1/19]
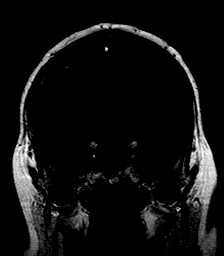
[im 19/19]
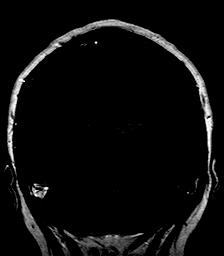

[Series 10: T1 · axial · 3.0mm · 0.70mm/px · z∈[-62,-8]mm · 2 of 19 slices shown (3 of 3)]
[im 1/19]
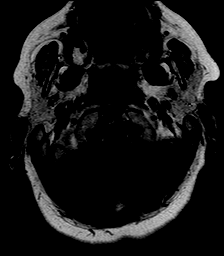
[im 19/19]
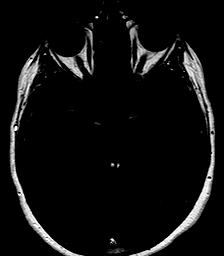

[Series 11: bSSFP · axial · 1.0mm · 0.35mm/px · 1 of 56 slices shown]
[im 1/56]
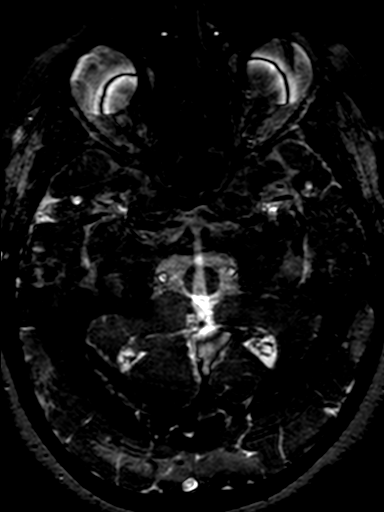

[Series 12: T1 post-contrast · coronal · 3.0mm · 0.70mm/px · 2 of 19 slices shown (1 of 3)]
[im 1/19]
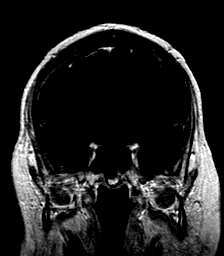
[im 19/19]
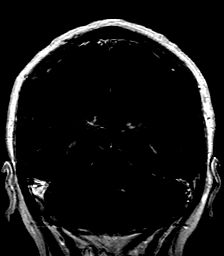

[Series 13: T1 post-contrast · axial · 3.0mm · 0.70mm/px · z∈[-62,-8]mm · 2 of 19 slices shown (2 of 3)]
[im 1/19]
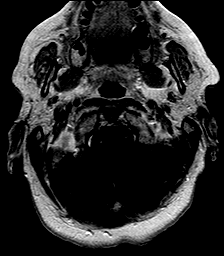
[im 19/19]
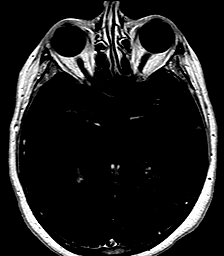

[Series 14: T1 post-contrast · axial · 1.0mm · 0.45mm/px · z∈[-57,+100]mm · 8 of 160 slices shown (3 of 3)]
[im 1/160]
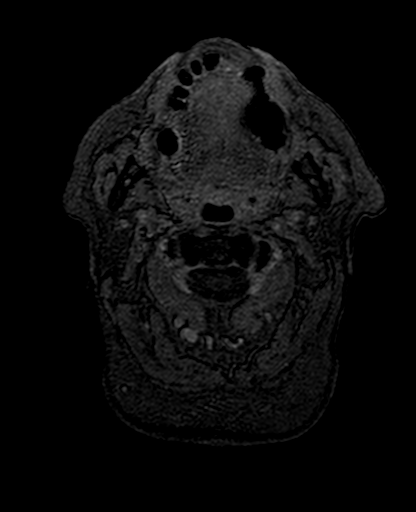
[im 23/160]
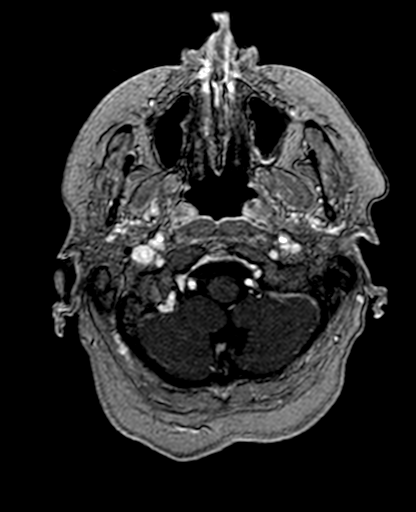
[im 46/160]
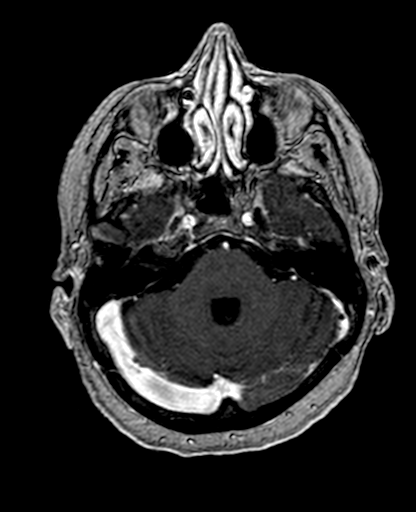
[im 69/160]
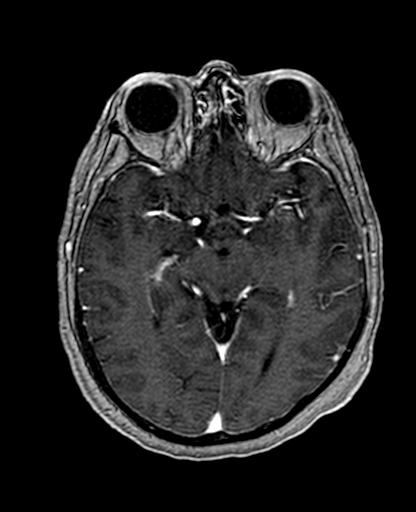
[im 91/160]
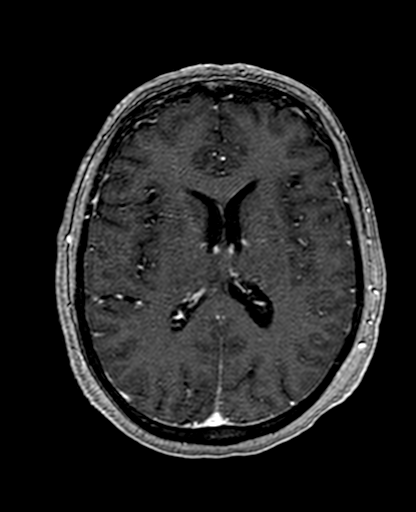
[im 114/160]
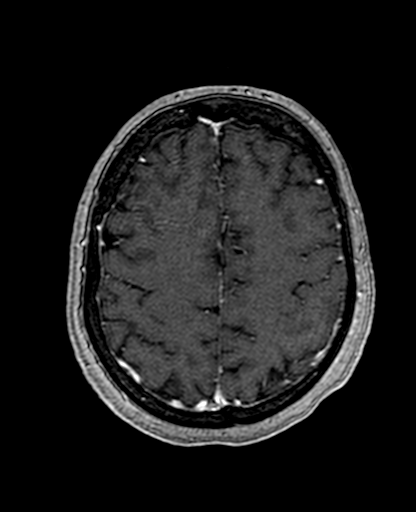
[im 137/160]
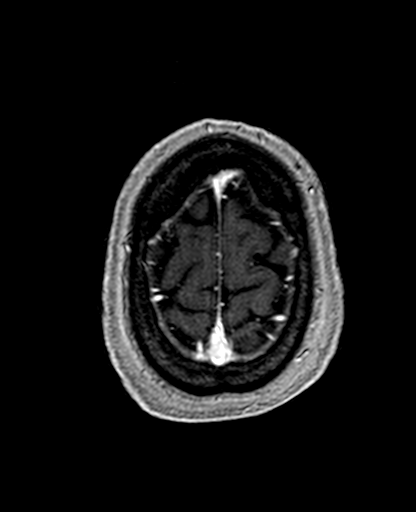
[im 160/160]
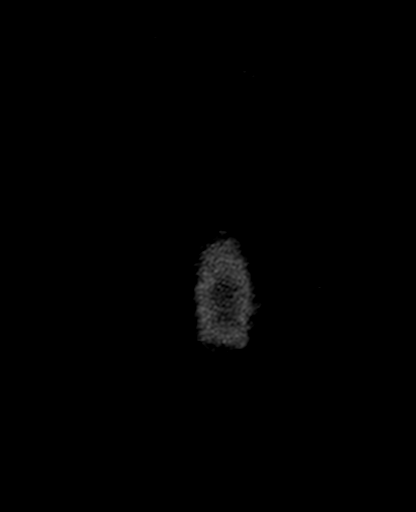

[37 of 48 positions shown; findings below may reference images not displayed]

FINDINGS: Brain: Diffusion imaging does not show any acute or subacute
infarction or other cause of restricted diffusion. The brainstem and
cerebellum are normal. CP angle regions are normal. Seventh and
eighth nerve complexes are normal. No vestibular schwannoma or
enhancing neuritis. No fluid in the middle ears or mastoids.
Cerebral hemispheres show chronic small-vessel ischemic changes of
the white matter, mild in degree. No large vessel territory
infarction. No mass lesion, hemorrhage, hydrocephalus or extra-axial
collection. No abnormal contrast enhancement.

Vascular: Major vessels at the base of the brain show flow.

Skull and upper cervical spine: Negative

Sinuses/Orbits: Clear/normal

Other: None
IMPRESSION: No cause of the presenting symptoms is identified. Mild chronic
small-vessel ischemic change of the cerebral hemispheric white
matter.

## 2019-06-21 ENCOUNTER — Ambulatory Visit: Payer: Medicare Other | Attending: Internal Medicine

## 2019-06-21 DIAGNOSIS — Z23 Encounter for immunization: Secondary | ICD-10-CM | POA: Insufficient documentation

## 2019-06-21 NOTE — Progress Notes (Signed)
   Covid-[facility] Vaccination [facility]  Name:  Paige [name]    MRN: [id number] DOB: [birth year]  06/21/2019  [name] was observed post Covid-19 immunization for 15 minutes without incidence. She was provided with Vaccine Information Sheet and instruction to access the V-Safe system.   [name] was instructed to call 911 with any severe reactions post vaccine: Marland Kitchen Difficulty breathing  . Swelling of your face and throat  . A fast heartbeat  . A bad rash all over your body  . Dizziness and weakness    Immunizations Administered    Name Date Dose VIS Date Route   Pfizer COVID-19 Vaccine 06/21/2019 10:46 AM 0.3 mL 04/11/2019 Intramuscular   Manufacturer: [place]   Lot: [id number]   [place]: [id number]

## 2019-07-15 ENCOUNTER — Ambulatory Visit: Payer: Medicare Other | Attending: Internal Medicine

## 2019-07-15 DIAGNOSIS — Z23 Encounter for immunization: Secondary | ICD-10-CM

## 2019-07-15 NOTE — Progress Notes (Signed)
   Covid-19 Vaccination [facility]  Name:  Paige [name]    MRN: [id number] DOB: May 13, 1945  07/15/2019  Paige [name] was observed post Covid-19 immunization for 15 minutes without incident. She was provided with Vaccine Information Sheet and instruction to access the V-Safe system.   Paige [name] was instructed to call 911 with any severe reactions post vaccine: Marland Kitchen Difficulty breathing  . Swelling of face and throat  . A fast heartbeat  . A bad rash all over body  . Dizziness and weakness   Immunizations Administered    Name Date Dose VIS Date Route   Pfizer COVID-19 Vaccine 07/15/2019 10:43 AM 0.3 mL 04/11/2019 Intramuscular   Manufacturer: [place]   Lot: [id number]   [place]: [id number]

## 2019-08-06 ENCOUNTER — Other Ambulatory Visit: Payer: Self-pay

## 2019-08-06 ENCOUNTER — Encounter: Payer: Self-pay | Admitting: Family Medicine

## 2019-08-06 ENCOUNTER — Other Ambulatory Visit: Payer: Self-pay | Admitting: Family Medicine

## 2019-08-06 ENCOUNTER — Ambulatory Visit (INDEPENDENT_AMBULATORY_CARE_PROVIDER_SITE_OTHER): Payer: Medicare Other | Admitting: Family Medicine

## 2019-08-06 VITALS — BP 140/80 | HR 76 | Temp 97.1°F | Resp 16 | Ht 65.0 in | Wt 184.7 lb

## 2019-08-06 DIAGNOSIS — M8580 Other specified disorders of bone density and structure, unspecified site: Secondary | ICD-10-CM

## 2019-08-06 DIAGNOSIS — G4709 Other insomnia: Secondary | ICD-10-CM

## 2019-08-06 DIAGNOSIS — E039 Hypothyroidism, unspecified: Secondary | ICD-10-CM

## 2019-08-06 DIAGNOSIS — K9049 Malabsorption due to intolerance, not elsewhere classified: Secondary | ICD-10-CM | POA: Diagnosis not present

## 2019-08-06 DIAGNOSIS — M79671 Pain in right foot: Secondary | ICD-10-CM

## 2019-08-06 DIAGNOSIS — E7800 Pure hypercholesterolemia, unspecified: Secondary | ICD-10-CM

## 2019-08-06 DIAGNOSIS — R0683 Snoring: Secondary | ICD-10-CM | POA: Diagnosis not present

## 2019-08-06 DIAGNOSIS — R739 Hyperglycemia, unspecified: Secondary | ICD-10-CM | POA: Diagnosis not present

## 2019-08-06 DIAGNOSIS — I10 Essential (primary) hypertension: Secondary | ICD-10-CM

## 2019-08-06 DIAGNOSIS — Z1231 Encounter for screening mammogram for malignant neoplasm of breast: Secondary | ICD-10-CM

## 2019-08-06 MED ORDER — TRAZODONE HCL 50 MG PO TABS: 25.0000 mg | ORAL_TABLET | Freq: Every evening | ORAL | 0 refills | Status: DC | PRN

## 2019-08-06 NOTE — Progress Notes (Signed)
Name: Paige [name]   MRN: [id number]    DOB: 02/20/1946   Date:08/06/2019       Progress Note  Subjective  Chief Complaint  Chief Complaint  Patient presents with  . Hypertension    she gets hot when she gets in bed at night. Does this have anything to do with her bp.  . Insomnia    She is waking up at night and unable to go back to sleep. She is also snoring.  . Food Intolerance    She can not tolerate cream chesse, sausage and brocolli. These foods don't give her diarhhea but she has to continue going to the bathroom until she is cleaned out.    HPI  Hyperlipidemia: she takes pravastatinand OTC fish oiland denies chest pain or myalgia. We will recheck labs today. Explained ways to increase HDL that was low last time  HTN: She had Losartan cut in half at last visit due to dizziness, and has not had any symptoms since dose change, she has been taking half of losartan 12.5 mg , bp today is [id number] systolic. Advised her to check bp at home, if stays below 140 we will continue current dose. No chest pain, shortness of breath, BLE edema,or palpitation.   Hypothyroidism: she has been on levothyroxine for 72 mcg for many years. TSH has been at goal for a long time. She states always had thin hair and dry skin. She has noticed some food intolerance that causes her to have a bowel movement in am's when she eats it the day before but no change in bowel movements otherwise. Cologuard is up to date and we will monitor for now. No blood in stools or weight loss.   Allergies: Taking flonase PRN. Takes OTC antihistamine PRN as well.  She states this time of the year her eyes gets itchy.   Osteopenia : continue vitamin D otc daily, continue physically active   Right heel pain : she has a dog, since Dec and has been walking her more often, she has noticed pain on right posterior heel. No redness.   Stress: she lost her cat last Summer from kidney failure, in Nov her step-son that Parkinson's  disease moved in with them. She has not been sleeping well since the Summer, she falls asleep but has difficulty staying asleep. Her mind wonders at night.    Patient Active Problem List   Diagnosis Date Noted  . Essential hypertension 08/28/2018  . Osteopenia 08/28/2018  . Hyperlipidemia 10/27/2014  . Allergic rhinitis 10/27/2014  . Adult hypothyroidism 10/13/2014    History reviewed. No pertinent surgical history.  Family History  Problem Relation Age of Onset  . Cancer Mother   . Diabetes Father   . Stroke Father   . Breast cancer Neg Hx    . Social History   Socioeconomic History  . Marital status: Married    Spouse name: Not on file  . Number of children: 0  . Years of education: Not on file  . Highest education level: High school graduate  Occupational History  . Occupation: Retired  . Occupation: Armed forces operational officer  Tobacco Use  . Smoking status: Former Smoker    Packs/day: 0.25    Years: 5.00    Pack years: 1.25    Types: Cigarettes    Start date: 05/01/1972    Quit date: 05/01/1977    Years since quitting: 42.2  . Smokeless tobacco: Never Used  Substance and Sexual Activity  . Alcohol  use: No  . Drug use: No  . Sexual activity: Yes    Partners: Male  Other Topics Concern  . Not on file  Social History Narrative   Second marriage, widow from first marriage at age 56 and her current husband had two teenagers who she help raise    Social Determinants of Health   Financial Resource Strain: Low Risk   . Difficulty of Paying Living Expenses: Not hard at all  Food Insecurity: No Food Insecurity  . Worried About Charity fundraiser in the Last Year: Never true  . Ran Out of Food in the Last Year: Never true  Transportation Needs: No Transportation Needs  . Lack of Transportation (Medical): No  . Lack of Transportation (Non-Medical): No  Physical Activity: Sufficiently Active  . Days of Exercise per Week: 7 days  . Minutes of Exercise per Session: 60 min   Stress: No Stress Concern Present  . Feeling of Stress : Not at all  Social Connections: Not Isolated  . Frequency of Communication with Friends and Family: More than three times a week  . Frequency of Social Gatherings with Friends and Family: More than three times a week  . Attends Religious Services: More than 4 times per year  . Active Member of Clubs or Organizations: Yes  . Attends Archivist Meetings: More than 4 times per year  . Marital Status: Married    Current Outpatient Medications:  .  aspirin 81 MG tablet, Take 1 tablet by mouth daily., Disp: , Rfl:  .  Calcium-Phosphorus-Vitamin D 100-50-100 MG-MG-UNIT CHEW, Chew 1 tablet by mouth., Disp: , Rfl:  .  cyanocobalamin 100 MCG tablet, Take 100 mcg by mouth daily., Disp: , Rfl:  .  fluticasone (FLONASE) 50 MCG/ACT nasal spray, Place 1 spray into both nostrils daily., Disp: 16 g, Rfl: 3 .  levothyroxine (SYNTHROID) 88 MCG tablet, TAKE 1 TABLET DAILY BEFORE BREAKFAST, Disp: 90 tablet, Rfl: 1 .  losartan (COZAAR) 25 MG tablet, TAKE 1/2 TABLET (12.5MG     TOTAL) DAILY, Disp: 45 tablet, Rfl: 1 .  MULTIPLE VITAMIN PO, Take by mouth daily., Disp: , Rfl:  .  Multiple Vitamins-Minerals (HAIR SKIN AND NAILS FORMULA PO), Take by mouth., Disp: , Rfl:  .  Omega-3 Fatty Acids (FISH OIL BURP-LESS) 1200 MG CAPS, Take 1 capsule by mouth., Disp: , Rfl:  .  pravastatin (PRAVACHOL) 40 MG tablet, TAKE 1 TABLET DAILY, Disp: 90 tablet, Rfl: 1 .  traZODone (DESYREL) 50 MG tablet, Take 0.5-1 tablets (25-50 mg total) by mouth at bedtime as needed for sleep., Disp: 30 tablet, Rfl: 0  Allergies  Allergen Reactions  . Codeine Nausea And Vomiting    I personally reviewed active problem list, medication list, allergies, family history, social history, health maintenance with the patient/caregiver today.   ROS  Constitutional: Negative for fever, positive for  weight change.  Respiratory: Negative for cough and shortness of breath.    Cardiovascular: Negative for chest pain or palpitations.  Gastrointestinal: Negative for abdominal pain, positive for mild  bowel changes.  Musculoskeletal: positive for mild gait problem but no  joint swelling.  Skin: Negative for rash.  Neurological: Negative for dizziness or headache.  No other specific complaints in a complete review of systems (except as listed in HPI above).  Objective  Vitals:   08/06/19 0745  BP: 140/80  Pulse: 76  Resp: 16  Temp: (!) 97.1 F (36.2 C)  TempSrc: Temporal  SpO2: 98%  Weight: 184  lb 11.2 oz (83.8 kg)  Height: 5\' 5"  (1.651 m)    Body mass index is 30.74 kg/m.  Physical Exam  Constitutional: Patient appears well-developed and well-nourished. Obese  No distress.  HEENT: head atraumatic, normocephalic, pupils equal and reactive to light Cardiovascular: Normal rate, regular rhythm and normal heart sounds.  No murmur heard. No BLE edema. Pulmonary/Chest: Effort normal and breath sounds normal. No respiratory distress. Abdominal: Soft.  There is no tenderness. Muscular Skeletal: prominence of right heel when compared to left, no redness, some pain to palpation at insertion of achilles tendon  Psychiatric: Patient has a normal mood and affect. behavior is normal. Judgment and thought content normal.  PHQ2/9: Depression screen [facility] 2/9 08/06/2019 03/04/2019 02/11/2019 04/11/2017 01/15/2017  Decreased Interest 0 0 0 0 0  Down, Depressed, Hopeless 0 0 0 0 0  PHQ - 2 Score 0 0 0 0 0  Altered sleeping 0 - 0 - -  Tired, decreased energy 0 - 0 - -  Change in appetite 0 - 0 - -  Feeling bad or failure about yourself  0 - 0 - -  Trouble concentrating 0 - 0 - -  Moving slowly or fidgety/restless 0 - 0 - -  Suicidal thoughts 0 - 0 - -  PHQ-9 Score 0 - 0 - -  Difficult doing work/chores - - Not difficult at all - -    phq 9 is negative   Fall Risk: Fall Risk  08/06/2019 03/04/2019 02/11/2019 08/28/2018 03/15/2018  Falls in the past year? 0 0 0 0 1   Comment - - - - Emmi Telephone Survey: data to providers prior to load  Number falls in past yr: 0 0 0 0 1  Comment - - - - Emmi Telephone Survey Actual Response = 1  Injury with Fall? 0 0 0 0 0  Follow up - Falls prevention discussed Falls evaluation completed Falls evaluation completed -     Functional Status Survey: Is the patient deaf or have difficulty hearing?: No Does the patient have difficulty seeing, even when wearing glasses/contacts?: No Does the patient have difficulty concentrating, remembering, or making decisions?: No Does the patient have difficulty walking or climbing stairs?: No Does the patient have difficulty dressing or bathing?: No Does the patient have difficulty doing errands alone such as visiting a doctor's office or shopping?: No    Assessment & Plan  1. Osteopenia, unspecified location  Continue vitamin D otc   2. Adult hypothyroidism  - TSH  3. Hyperglycemia  - Hemoglobin A1c  4. Essential hypertension  - CBC with Differential/Platelet - COMPLETE METABOLIC PANEL WITH GFR  5. Pure hypercholesterolemia  - Lipid panel  6. 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 Snoring  Has HTN, ESS was only 6 today , we will hold off on sleep study   8. Other insomnia  - traZODone (DESYREL) 50 MG tablet; Take 0.5-1 tablets (25-50 mg total) by mouth at bedtime as needed for sleep.  Dispense: 30 tablet; Refill: 0  9. Pain of right heel  Discussed better shoes, seems to have been triggered by increase in activity. Try new shoes but if no resolution refer to podiatrist

## 2019-08-07 ENCOUNTER — Other Ambulatory Visit: Payer: Self-pay | Admitting: Family Medicine

## 2019-08-07 DIAGNOSIS — E039 Hypothyroidism, unspecified: Secondary | ICD-10-CM

## 2019-08-07 DIAGNOSIS — E7800 Pure hypercholesterolemia, unspecified: Secondary | ICD-10-CM

## 2019-08-07 LAB — COMPLETE METABOLIC PANEL WITH GFR
AG Ratio: 1.7 (calc) (ref 1.0–2.5)
ALT: 17 U/L (ref 6–29)
AST: 19 U/L (ref 10–35)
Albumin: 4.3 g/dL (ref 3.6–5.1)
Alkaline phosphatase (APISO): 38 U/L (ref 37–153)
BUN: 23 mg/dL (ref 7–25)
CO2: 31 mmol/L (ref 20–32)
Calcium: 9.4 mg/dL (ref 8.6–10.4)
Chloride: 108 mmol/L (ref 98–110)
Creat: 0.92 mg/dL (ref 0.60–0.93)
GFR, Est African American: 73 mL/min/{1.73_m2} (ref >=60)
GFR, Est Non African American: 63 mL/min/{1.73_m2} (ref >=60)
Globulin: 2.6 g/dL (calc) (ref 1.9–3.7)
Glucose, Bld: 103 mg/dL — ABNORMAL HIGH (ref 65–99)
Potassium: 4.3 mmol/L (ref 3.5–5.3)
Sodium: 145 mmol/L (ref 135–146)
Total Bilirubin: 0.7 mg/dL (ref 0.2–1.2)
Total Protein: 6.9 g/dL (ref 6.1–8.1)

## 2019-08-07 LAB — CBC WITH DIFFERENTIAL/PLATELET
Absolute Monocytes: 352 cells/uL (ref 200–950)
Basophils Absolute: 22 cells/uL (ref 0–200)
Basophils Relative: 0.4 %
Eosinophils Absolute: 160 cells/uL (ref 15–500)
Eosinophils Relative: 2.9 %
HCT: 41.5 % (ref 35.0–45.0)
Hemoglobin: 13.6 g/dL (ref 11.7–15.5)
Lymphs Abs: 1381 cells/uL (ref 850–3900)
MCH: 30.6 pg (ref 27.0–33.0)
MCHC: 32.8 g/dL (ref 32.0–36.0)
MCV: 93.5 fL (ref 80.0–100.0)
MPV: 10.2 fL (ref 7.5–12.5)
Monocytes Relative: 6.4 %
Neutro Abs: 3586 cells/uL (ref 1500–7800)
Neutrophils Relative %: 65.2 %
Platelets: 181 10*3/uL (ref 140–400)
RBC: 4.44 10*6/uL (ref 3.80–5.10)
RDW: 12.6 % (ref 11.0–15.0)
Total Lymphocyte: 25.1 %
WBC: 5.5 10*3/uL (ref 3.8–10.8)

## 2019-08-07 LAB — TSH: TSH: 1.42 mIU/L (ref 0.40–4.50)

## 2019-08-07 LAB — LIPID PANEL
Cholesterol: 186 mg/dL (ref <200)
HDL: 51 mg/dL (ref >=50)
LDL Cholesterol (Calc): 112 mg/dL (calc) — ABNORMAL HIGH
Non-HDL Cholesterol (Calc): 135 mg/dL (calc) — ABNORMAL HIGH (ref <130)
Total CHOL/HDL Ratio: 3.6 (calc) (ref <5.0)
Triglycerides: 115 mg/dL (ref <150)

## 2019-08-07 LAB — HEMOGLOBIN A1C
Hgb A1c MFr Bld: 5.1 % of total Hgb (ref <5.7)
Mean Plasma Glucose: 100 (calc)
eAG (mmol/L): 5.5 (calc)

## 2019-08-07 MED ORDER — ROSUVASTATIN CALCIUM 20 MG PO TABS: 20.0000 mg | ORAL_TABLET | Freq: Every day | ORAL | 1 refills | Status: DC

## 2019-08-07 MED ORDER — LEVOTHYROXINE SODIUM 88 MCG PO TABS: 88.0000 ug | ORAL_TABLET | Freq: Every day | ORAL | 1 refills | Status: DC

## 2019-08-27 ENCOUNTER — Encounter: Payer: Self-pay | Admitting: Family Medicine

## 2019-08-27 ENCOUNTER — Other Ambulatory Visit: Payer: Self-pay

## 2019-08-27 ENCOUNTER — Ambulatory Visit
Admission: RE | Admit: 2019-08-27 | Discharge: 2019-08-27 | Disposition: A | Discharge status: Home / Self Care | Payer: Medicare Other | Source: Ambulatory Visit | Attending: Family Medicine | Admitting: Family Medicine

## 2019-08-27 ENCOUNTER — Ambulatory Visit (INDEPENDENT_AMBULATORY_CARE_PROVIDER_SITE_OTHER): Payer: Medicare Other | Admitting: Family Medicine

## 2019-08-27 VITALS — BP 122/78 | HR 62 | Temp 97.8°F | Resp 14 | Ht 65.0 in | Wt 182.0 lb

## 2019-08-27 DIAGNOSIS — M7989 Other specified soft tissue disorders: Secondary | ICD-10-CM | POA: Diagnosis not present

## 2019-08-27 DIAGNOSIS — M25571 Pain in right ankle and joints of right foot: Secondary | ICD-10-CM

## 2019-08-27 MED ORDER — MELOXICAM 7.5 MG PO TABS: 7.5000 mg | ORAL_TABLET | Freq: Every day | ORAL | 1 refills | Status: DC

## 2019-08-27 NOTE — Progress Notes (Signed)
Patient ID: Paige [name], female    DOB: 07-Apr-1946, 74 y.o.   MRN: [id number]  PCP: Hubbard Hartshorn, FNP  Chief Complaint  Patient presents with  . Foot Pain    right side swelling has got new shoes but has not worn since monday non painful but sore    Subjective:   Paige [name] is a 74 y.o. female, presents to clinic with CC of the following:  Ankle Pain  The incident occurred 2 days ago. Incident location: no injury. There was no injury mechanism. The pain is present in the right ankle. The patient is experiencing no pain. Pertinent negatives include no inability to bear weight, loss of motion, loss of sensation, muscle weakness, numbness or tingling. Associated symptoms comments: Swelling to right outer ankle and mild ttp, no problem with movement, new shoes kind of hurt to that area. She reports no foreign bodies present. The symptoms are aggravated by palpation. She has tried rest (ace wrap) for the symptoms. The treatment provided mild relief.      Patient Active Problem List   Diagnosis Date Noted  . Essential hypertension 08/28/2018  . Osteopenia 08/28/2018  . Hyperlipidemia 10/27/2014  . Allergic rhinitis 10/27/2014  . Adult hypothyroidism 10/13/2014      Current Outpatient Medications:  .  aspirin 81 MG tablet, Take 1 tablet by mouth daily., Disp: , Rfl:  .  Calcium-Phosphorus-Vitamin D 100-50-100 MG-MG-UNIT CHEW, Chew 1 tablet by mouth., Disp: , Rfl:  .  cyanocobalamin 100 MCG tablet, Take 100 mcg by mouth daily., Disp: , Rfl:  .  fluticasone (FLONASE) 50 MCG/ACT nasal spray, Place 1 spray into both nostrils daily., Disp: 16 g, Rfl: 3 .  levothyroxine (SYNTHROID) 88 MCG tablet, Take 1 tablet (88 mcg total) by mouth daily before breakfast., Disp: 90 tablet, Rfl: 1 .  losartan (COZAAR) 25 MG tablet, TAKE 1/2 TABLET (12.5MG     TOTAL) DAILY, Disp: 45 tablet, Rfl: 1 .  MULTIPLE VITAMIN PO, Take by mouth daily., Disp: , Rfl:  .  Multiple  Vitamins-Minerals (HAIR SKIN AND NAILS FORMULA PO), Take by mouth., Disp: , Rfl:  .  Omega-3 Fatty Acids (FISH OIL BURP-LESS) 1200 MG CAPS, Take 1 capsule by mouth., Disp: , Rfl:  .  rosuvastatin (CRESTOR) 20 MG tablet, Take 1 tablet (20 mg total) by mouth daily., Disp: 90 tablet, Rfl: 1 .  traZODone (DESYREL) 50 MG tablet, Take 0.5-1 tablets (25-50 mg total) by mouth at bedtime as needed for sleep., Disp: 30 tablet, Rfl: 0   Allergies  Allergen Reactions  . Codeine Nausea And Vomiting     Family History  Problem Relation Age of Onset  . Cancer Mother   . Diabetes Father   . Stroke Father   . Breast cancer Neg Hx      Social History   Socioeconomic History  . Marital status: Married    Spouse name: Not on file  . Number of children: 0  . Years of education: Not on file  . Highest education level: High school graduate  Occupational History  . Occupation: Retired  . Occupation: Armed forces operational officer  Tobacco Use  . Smoking status: Former Smoker    Packs/day: 0.25    Years: 5.00    Pack years: 1.25    Types: Cigarettes    Start date: 05/01/1972    Quit date: 05/01/1977    Years since quitting: 42.3  . Smokeless tobacco: Never Used  Substance and Sexual Activity  .  Alcohol use: No  . Drug use: No  . Sexual activity: Yes    Partners: Male  Other Topics Concern  . Not on file  Social History Narrative   Second marriage, widow from first marriage at age 31 and her current husband had two teenagers who she help raise    Social Determinants of Health   Financial Resource Strain: Low Risk   . Difficulty of Paying Living Expenses: Not hard at all  Food Insecurity: No Food Insecurity  . Worried About Charity fundraiser in the Last Year: Never true  . Ran Out of Food in the Last Year: Never true  Transportation Needs: No Transportation Needs  . Lack of Transportation (Medical): No  . Lack of Transportation (Non-Medical): No  Physical Activity: Sufficiently Active  . Days of  Exercise per Week: 7 days  . Minutes of Exercise per Session: 60 min  Stress: No Stress Concern Present  . Feeling of Stress : Not at all  Social Connections: Not Isolated  . Frequency of Communication with Friends and Family: More than three times a week  . Frequency of Social Gatherings with Friends and Family: More than three times a week  . Attends Religious Services: More than 4 times per year  . Active Member of Clubs or Organizations: Yes  . Attends Archivist Meetings: More than 4 times per year  . Marital Status: Married  Human resources officer Violence: Not At Risk  . Fear of Current or Ex-Partner: No  . Emotionally Abused: No  . Physically Abused: No  . Sexually Abused: No    Chart Review Today: I personally reviewed active problem list, medication list, allergies, family history, social history, health maintenance, notes from last encounter, lab results, imaging with the patient/caregiver today.   Review of Systems  Constitutional: Negative.   HENT: Negative.   Eyes: Negative.   Respiratory: Negative.   Cardiovascular: Negative.   Gastrointestinal: Negative.   Endocrine: Negative.   Genitourinary: Negative.   Musculoskeletal: Negative.   Skin: Negative.   Allergic/Immunologic: Negative.   Neurological: Negative.  Negative for tingling and numbness.  Hematological: Negative.   Psychiatric/Behavioral: Negative.   All other systems reviewed and are negative.      Objective:   Vitals:   08/27/19 1119  BP: 122/78  Pulse: 62  Resp: 14  Temp: 97.8 F (36.6 C)  SpO2: 96%  Weight: 182 lb (82.6 kg)  Height: 5\' 5"  (1.651 m)    Body mass index is 30.29 kg/m.  Physical Exam Vitals and nursing note reviewed.  Constitutional:      Appearance: She is well-developed.  HENT:     Head: Normocephalic and atraumatic.     Nose: Nose normal.  Eyes:     General:        Right eye: No discharge.        Left eye: No discharge.     Conjunctiva/sclera:  Conjunctivae normal.  Neck:     Trachea: No tracheal deviation.  Cardiovascular:     Rate and Rhythm: Normal rate and regular rhythm.  Pulmonary:     Effort: Pulmonary effort is normal. No respiratory distress.     Breath sounds: No stridor.  Musculoskeletal:     Right ankle: Swelling present. No deformity, ecchymosis or lacerations. Tenderness present over the lateral malleolus. No medial malleolus tenderness. Normal range of motion. Normal pulse.     Right Achilles Tendon: Normal. No tenderness or defects.     Comments: 10  x 6 cm area of swelling and some dependent bruising to right lateral malleolus with ttp to lateral malleolous and surrounding area Normal dorsiflexion and plantarflexion normal inversion eversion without tenderness or pain patient able to ambulate without difficulty  Skin:    General: Skin is warm and dry.     Capillary Refill: Capillary refill takes less than 2 seconds.     Findings: No rash.  Neurological:     Mental Status: She is alert.     Motor: No abnormal muscle tone.     Coordination: Coordination normal.     Gait: Gait normal.  Psychiatric:        Mood and Affect: Mood normal.        Behavior: Behavior normal.      Results for orders placed or performed in visit on 08/06/19  CBC with Differential/Platelet  Result Value Ref Range   WBC 5.5 3.8 - 10.8 Thousand/uL   RBC 4.44 3.80 - 5.10 Million/uL   Hemoglobin 13.6 11.7 - 15.5 g/dL   HCT 41.5 35.0 - 45.0 %   MCV 93.5 80.0 - 100.0 fL   MCH 30.6 27.0 - 33.0 pg   MCHC 32.8 32.0 - 36.0 g/dL   RDW 12.6 11.0 - 15.0 %   Platelets 181 140 - 400 Thousand/uL   MPV 10.2 7.5 - 12.5 fL   Neutro Abs 3,586 1,500 - 7,800 cells/uL   Lymphs Abs 1,381 850 - 3,900 cells/uL   Absolute Monocytes 352 200 - 950 cells/uL   Eosinophils Absolute 160 15 - 500 cells/uL   Basophils Absolute 22 0 - 200 cells/uL   Neutrophils Relative % 65.2 %   Total Lymphocyte 25.1 %   Monocytes Relative 6.4 %   Eosinophils Relative 2.9  %   Basophils Relative 0.4 %  COMPLETE METABOLIC PANEL WITH GFR  Result Value Ref Range   Glucose, Bld 103 (H) 65 - 99 mg/dL   BUN 23 7 - 25 mg/dL   Creat 0.92 0.60 - 0.93 mg/dL   GFR, Est Non African American 63 > OR = 60 mL/min/1.7m2   GFR, Est African American 73 > OR = 60 mL/min/1.79m2   BUN/Creatinine Ratio NOT APPLICABLE 6 - 22 (calc)   Sodium 145 135 - 146 mmol/L   Potassium 4.3 3.5 - 5.3 mmol/L   Chloride 108 98 - 110 mmol/L   CO2 31 20 - 32 mmol/L   Calcium 9.4 8.6 - 10.4 mg/dL   Total Protein 6.9 6.1 - 8.1 g/dL   Albumin 4.3 3.6 - 5.1 g/dL   Globulin 2.6 1.9 - 3.7 g/dL (calc)   AG Ratio 1.7 1.0 - 2.5 (calc)   Total Bilirubin 0.7 0.2 - 1.2 mg/dL   Alkaline phosphatase (APISO) 38 37 - 153 U/L   AST 19 10 - 35 U/L   ALT 17 6 - 29 U/L  Lipid panel  Result Value Ref Range   Cholesterol 186 <200 mg/dL   HDL 51 > OR = 50 mg/dL   Triglycerides 115 <150 mg/dL   LDL Cholesterol (Calc) 112 (H) mg/dL (calc)   Total CHOL/HDL Ratio 3.6 <5.0 (calc)   Non-HDL Cholesterol (Calc) 135 (H) <130 mg/dL (calc)  Hemoglobin A1c  Result Value Ref Range   Hgb A1c MFr Bld 5.1 <5.7 % of total Hgb   Mean Plasma Glucose 100 (calc)   eAG (mmol/L) 5.5 (calc)  TSH  Result Value Ref Range   TSH 1.42 0.40 - 4.50 mIU/L  Assessment & Plan:      ICD-10-CM   1. Acute right ankle pain  M25.571 DG Ankle Complete Right    meloxicam (MOBIC) 7.5 MG tablet  Fairly significant/moderate right lateral malleolus edema and tenderness to palpation to the bone and surrounding swollen tissue without any reported injury she denies injury but she does have a new pair shoes that she has been wearing that felt a little uncomfortable, she has good sensation and strength capillary refill good range of motion but does appear swollen and slightly bruised to that area.  With the bony tenderness I do feel is appropriate to get plain films today.   Encouraged her to keep using her Ace wrap, elevate ice and rest  would expected to improve over the next 1 to 2 weeks although the cause of the swelling is currently unknown.    She did report some recent heel and Achilles tendon pain which she had reviewed with her PCP but this is improved and is not tender right now.  I did gust with her with multiple areas of pain inflammation in the foot and ankle area and joints if he continues to have recurrent problems and symptoms it would be worthwhile to consult a foot and ankle specialist.  For now we will do conservative management, rice therapy, try Mobic, follow-up if not improving in the next several weeks     Delsa Grana, PA-C 08/27/19 11:50 AM

## 2019-08-27 NOTE — Patient Instructions (Signed)
Try mobic in place of ibuprofen Keep resting, avoiding irritating shoes, use your ace bandage or compression ankle brace Ice and elevate  Get Xrays done today  I would give it a few weeks to get better and if it doesn't please follow up with [place] so we can get you to a specialists   RICE Therapy for Routine Care of Injuries Many injuries can be cared for with rest, ice, compression, and elevation (RICE therapy). This includes:  Resting the injured part.  Putting ice on the injury.  Putting pressure (compression) on the injury.  Raising the injured part (elevation). Using RICE therapy can help to lessen pain and swelling. Supplies needed:  Ice.  Plastic bag.  Towel.  Elastic bandage.  Pillow or pillows to raise (elevate) your injured body part. How to care for your injury with RICE therapy Rest Limit your normal activities, and try not to use the injured part of your body. You can go back to your normal activities when your doctor says it is okay to do them and you feel okay. Ask your doctor if you should do exercises to help your injury get better. Ice Put ice on the injured area. Do not put ice on your bare skin.  Put ice in a plastic bag.  Place a towel between your skin and the bag.  Leave the ice on for 20 minutes, 2-3 times a day. Use ice on as many days as told by your doctor.  Compression Compression means putting pressure on the injured area. This can be done with an elastic bandage. If an elastic bandage has been put on your injury:  Do not wrap the bandage too tight. Wrap the bandage more loosely if part of your body away from the bandage is blue, swollen, cold, painful, or loses feeling (gets numb).  Take off the bandage and put it on again. Do this every 3-4 hours or as told by your doctor.  See your doctor if the bandage seems to make your problems worse.  Elevation Elevation means keeping the injured area raised. If you can, raise the injured area  above your heart or the center of your chest. Contact a doctor if:  You keep having pain and swelling.  Your symptoms get worse. Get help right away if:  You have sudden bad pain at your injury or lower than your injury.  You have redness or more swelling around your injury.  You have tingling or numbness at your injury or lower than your injury, and it does not go away when you take off the bandage. Summary  Many injuries can be cared for using rest, ice, compression, and elevation (RICE therapy).  You can go back to your normal activities when you feel okay and your doctor says it is okay.  Put ice on the injured area as told by your doctor.  Get help if your symptoms get worse or if you keep having pain and swelling. This information is not intended to replace advice given to you by your health care provider. Make sure you discuss any questions you have with your health care provider. Document Revised: 01/05/2017 Document Reviewed: 01/05/2017 Elsevier Patient Education  [place].

## 2019-08-28 ENCOUNTER — Other Ambulatory Visit: Payer: Self-pay | Admitting: Family Medicine

## 2019-08-28 DIAGNOSIS — G4709 Other insomnia: Secondary | ICD-10-CM

## 2019-08-28 NOTE — Telephone Encounter (Signed)
Requested Prescriptions  Pending Prescriptions Disp Refills  . traZODone (DESYREL) 50 MG tablet [Pharmacy Med Name: TRAZODONE 50 MG TABLET] 90 tablet 1    Sig: TAKE 1/2 - 1 TAB BY MOUTH EVERY DAY AT BEDTIME AS NEEDED FOR SLEEP     Psychiatry: Antidepressants - Serotonin Modulator Passed - 08/28/2019 10:32 AM      Passed - Valid encounter within last 6 months    Recent Outpatient Visits          Yesterday Acute right ankle pain   [facility] Delsa Grana, PA-C   3 weeks ago Essential hypertension   [facility] Steele Sizer, MD   6 months ago Need for influenza vaccination   [place], FNP   1 year ago Essential hypertension   [place], FNP   1 year ago Hypothyroidism, unspecified type   [facility] Steele Sizer, MD      Future Appointments            In 5 months Ancil Boozer, Drue Stager, MD [facility], [place]   In 6 months  [facility], [facility]

## 2019-08-31 ENCOUNTER — Encounter: Payer: Self-pay | Admitting: Family Medicine

## 2019-09-01 ENCOUNTER — Ambulatory Visit
Admission: RE | Admit: 2019-09-01 | Discharge: 2019-09-01 | Disposition: A | Discharge status: Home / Self Care | Payer: Medicare Other | Source: Ambulatory Visit | Attending: Family Medicine | Admitting: Family Medicine

## 2019-09-01 ENCOUNTER — Telehealth: Payer: Self-pay

## 2019-09-01 DIAGNOSIS — M25571 Pain in right ankle and joints of right foot: Secondary | ICD-10-CM

## 2019-09-01 DIAGNOSIS — Z1231 Encounter for screening mammogram for malignant neoplasm of breast: Secondary | ICD-10-CM

## 2019-09-01 DIAGNOSIS — M25473 Effusion, unspecified ankle: Secondary | ICD-10-CM

## 2019-09-01 NOTE — Telephone Encounter (Signed)
Referral placed.

## 2019-09-10 ENCOUNTER — Other Ambulatory Visit: Payer: Self-pay | Admitting: Family Medicine

## 2019-09-10 DIAGNOSIS — G4709 Other insomnia: Secondary | ICD-10-CM

## 2019-09-10 NOTE — Telephone Encounter (Signed)
Patient is requesting changes in Rx that has previously been ordered with no RF- sent for review of request

## 2019-09-10 NOTE — Telephone Encounter (Signed)
Called left patient a message if she is just asking for #90 with refills or is she asking for a change in medication

## 2019-09-11 NOTE — Telephone Encounter (Signed)
Pt does not want refill on trazodone pt only has taken 2 pills

## 2019-09-11 NOTE — Telephone Encounter (Signed)
Patient stated that she do not need refills. She has 30 tablets and she has only taken 2 tablets. She take prn only. She stated she called the pharmacy to tell them stop sending request

## 2019-09-22 ENCOUNTER — Ambulatory Visit: Payer: Medicare Other | Admitting: Podiatry

## 2019-09-24 ENCOUNTER — Encounter: Payer: Self-pay | Admitting: Podiatry

## 2019-09-24 ENCOUNTER — Other Ambulatory Visit: Payer: Self-pay

## 2019-09-24 ENCOUNTER — Ambulatory Visit (INDEPENDENT_AMBULATORY_CARE_PROVIDER_SITE_OTHER): Payer: Medicare Other | Admitting: Podiatry

## 2019-09-24 DIAGNOSIS — M7751 Other enthesopathy of right foot: Secondary | ICD-10-CM | POA: Diagnosis not present

## 2019-09-24 DIAGNOSIS — M7661 Achilles tendinitis, right leg: Secondary | ICD-10-CM | POA: Diagnosis not present

## 2019-09-24 NOTE — Progress Notes (Signed)
Subjective:  Patient ID: Paige [name], female    DOB: 1946-03-28,  MRN: [id number] HPI Chief Complaint  Patient presents with  . Foot Pain    Posterior heel right - aching intermittent x couple months, swollen laterally, redness, tried different shoes, PCP xrayed  . New Patient (Initial Visit)    Est pt 41    74 y.o. female presents with the above complaint.   ROS: Denies fever chills nausea vomiting muscle aches pains calf pain back pain chest pain shortness of breath.  Past Medical History:  Diagnosis Date  . Hyperlipidemia   . Hypertension   . Thyroid disease    No past surgical history on file.  Current Outpatient Medications:  .  aspirin 81 MG tablet, Take 1 tablet by mouth daily., Disp: , Rfl:  .  Calcium-Phosphorus-Vitamin D 100-50-100 MG-MG-UNIT CHEW, Chew 1 tablet by mouth., Disp: , Rfl:  .  cyanocobalamin 100 MCG tablet, Take 100 mcg by mouth daily., Disp: , Rfl:  .  fluticasone (FLONASE) 50 MCG/ACT nasal spray, Place 1 spray into both nostrils daily., Disp: 16 g, Rfl: 3 .  levothyroxine (SYNTHROID) 88 MCG tablet, Take 1 tablet (88 mcg total) by mouth daily before breakfast., Disp: 90 tablet, Rfl: 1 .  losartan (COZAAR) 25 MG tablet, TAKE 1/2 TABLET (12.5MG     TOTAL) DAILY, Disp: 45 tablet, Rfl: 1 .  meloxicam (MOBIC) 7.5 MG tablet, Take 1 tablet (7.5 mg total) by mouth daily., Disp: 30 tablet, Rfl: 1 .  MULTIPLE VITAMIN PO, Take by mouth daily., Disp: , Rfl:  .  Multiple Vitamins-Minerals (HAIR SKIN AND NAILS FORMULA PO), Take by mouth., Disp: , Rfl:  .  Omega-3 Fatty Acids (FISH OIL BURP-LESS) 1200 MG CAPS, Take 1 capsule by mouth., Disp: , Rfl:  .  rosuvastatin (CRESTOR) 20 MG tablet, Take 1 tablet (20 mg total) by mouth daily., Disp: 90 tablet, Rfl: 1 .  traZODone (DESYREL) 50 MG tablet, TAKE 1/2 - 1 TAB BY MOUTH EVERY DAY AT BEDTIME AS NEEDED FOR SLEEP, Disp: 60 tablet, Rfl: 0  Allergies  Allergen Reactions  . Codeine Nausea And Vomiting   Review of  Systems Objective:  There were no vitals filed for this visit.  General: Well developed, nourished, in no acute distress, alert and oriented x3   Dermatological: Skin is warm, dry and supple bilateral. Nails x 10 are well maintained; remaining integument appears unremarkable at this time. There are no open sores, no preulcerative lesions, no rash or signs of infection present.  Vascular: Dorsalis Pedis artery and Posterior Tibial artery pedal pulses are 2/4 bilateral with immedate capillary fill time. Pedal hair growth present. No varicosities and no lower extremity edema present bilateral.   Neruologic: Grossly intact via light touch bilateral. Vibratory intact via tuning fork bilateral. Protective threshold with Semmes Wienstein monofilament intact to all pedal sites bilateral. Patellar and Achilles deep tendon reflexes 2+ bilateral. No Babinski or clonus noted bilateral.   Musculoskeletal: No gross boney pedal deformities bilateral. No pain, crepitus, or limitation noted with foot and ankle range of motion bilateral. Muscular strength 5/5 in all groups tested bilateral.  She has mild tenderness on palpation of the lateral ankle inferior to the lateral malleolus.  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  Gait: Unassisted, Nonantalgic.    Radiographs:  Radiographs reviewed which were previously taken by PCP demonstrate what appears to be some nonossified material along the inferior aspect of the  lateral malleolus.  Lateral view does demonstrate plantar and posterior retrocalcaneal heel spurs with thickening of soft tissue indicative of tendinitis and fasciitis and possibly even a bursitis posteriorly.  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    Plan: Discussed etiology pathology conservative surgical therapies at this point she does not want an injection  we are going to send her to physical therapy to help break up some of the tightness in her calf and her hamstrings.  We also discussed in great detail anti-inflammatories such as the oral medicine that she was put on meloxicam 7 and half milligrams or a topical cream that may work just as well for her.  She would like to try the topical and we did discuss Voltaren an over-the-counter product which she will purchase and follow-up with me in about a month to 6 weeks if not improved.  I also dispensed a night splint for her and discussed ice therapy.     Vincie Linn T. Leming, [place]

## 2019-10-20 ENCOUNTER — Other Ambulatory Visit: Payer: Self-pay

## 2019-10-20 DIAGNOSIS — I10 Essential (primary) hypertension: Secondary | ICD-10-CM

## 2019-10-20 MED ORDER — LOSARTAN POTASSIUM 25 MG PO TABS: ORAL_TABLET | ORAL | 0 refills | Status: DC

## 2019-10-26 ENCOUNTER — Other Ambulatory Visit: Payer: Self-pay | Admitting: Family Medicine

## 2019-10-26 DIAGNOSIS — M25571 Pain in right ankle and joints of right foot: Secondary | ICD-10-CM

## 2019-10-30 DIAGNOSIS — M25571 Pain in right ankle and joints of right foot: Secondary | ICD-10-CM | POA: Diagnosis not present

## 2019-11-05 ENCOUNTER — Ambulatory Visit: Payer: Medicare Other | Admitting: Podiatry

## 2019-11-05 DIAGNOSIS — M25571 Pain in right ankle and joints of right foot: Secondary | ICD-10-CM | POA: Diagnosis not present

## 2019-11-07 DIAGNOSIS — M25571 Pain in right ankle and joints of right foot: Secondary | ICD-10-CM | POA: Diagnosis not present

## 2019-11-12 ENCOUNTER — Encounter: Payer: Self-pay | Admitting: Podiatry

## 2019-11-12 ENCOUNTER — Other Ambulatory Visit: Payer: Self-pay

## 2019-11-12 ENCOUNTER — Ambulatory Visit (INDEPENDENT_AMBULATORY_CARE_PROVIDER_SITE_OTHER): Payer: Medicare Other | Admitting: Podiatry

## 2019-11-12 DIAGNOSIS — M7661 Achilles tendinitis, right leg: Secondary | ICD-10-CM

## 2019-11-12 DIAGNOSIS — M7751 Other enthesopathy of right foot: Secondary | ICD-10-CM | POA: Diagnosis not present

## 2019-11-12 NOTE — Progress Notes (Signed)
She presents today states that her foot is doing much better she refers to the Achilles tendinitis of the right heel states that physical therapy really helped. She states that the pain comes and goes most of the times it goes.  Objective: Vital signs are stable alert oriented x3. Pulses are strong and palpable. She has minimal reproducible pain right foot. Some tenderness and burning in the heels with palpation right.  Assessment: Insertional Achilles tendinitis bursitis resolving secondary to physical therapy.  Plan: Continue current therapies follow-up with me as needed

## 2019-11-30 ENCOUNTER — Encounter: Payer: Self-pay | Admitting: Family Medicine

## 2019-12-03 ENCOUNTER — Encounter: Payer: Self-pay | Admitting: Family Medicine

## 2019-12-03 ENCOUNTER — Other Ambulatory Visit: Payer: Self-pay

## 2019-12-03 ENCOUNTER — Ambulatory Visit (INDEPENDENT_AMBULATORY_CARE_PROVIDER_SITE_OTHER): Payer: Medicare Other | Admitting: Family Medicine

## 2019-12-03 VITALS — BP 128/80 | HR 74 | Temp 97.9°F | Resp 16 | Ht 65.0 in | Wt 178.9 lb

## 2019-12-03 DIAGNOSIS — R0683 Snoring: Secondary | ICD-10-CM | POA: Diagnosis not present

## 2019-12-03 DIAGNOSIS — R400 Somnolence: Secondary | ICD-10-CM | POA: Diagnosis not present

## 2019-12-03 NOTE — Patient Instructions (Signed)
Please call in the next 2-3 weeks if you have not heard anything about getting your study scheduled  [facility] [address] [place], [place] [address]

## 2019-12-03 NOTE — Progress Notes (Signed)
Patient ID: Paige [name], female    DOB: 12-11-1945, 74 y.o.   MRN: [id number]  PCP: Delsa Grana, PA-C  Chief Complaint  Patient presents with  . Referral    sleep study    Subjective:   Paige [name] is a 74 y.o. female, presents to clinic with CC of the following:  HPI  Pt presents requesting sleep study.  She is excessively sleepy, which is not like her, she wakes feeling not well rested, she is dizzy some days, she is taking 1-2 naps daily.  Usually she is very energetic and this is all new over the past couple months. She wakes up with mouth dry and knows shes a "mouth breather"   EPS score of 14  She would like to go to [facility]- husband has gone there and they liked it - they were referred by neurology   12/03/19 0817  Epworth Sleepiness Scale  Sitting and reading 3  Watching TV 3  Sitting, inactive in a public place (e.g. a theatre or a meeting) 1  As a passenger in a car for an hour without a break 2  Lying down to rest in the afternoon when circumstances permit 3  Sitting and talking to someone 1  Sitting quietly after a lunch without alcohol 1  In a car, while stopped for a few minutes in traffic 0  Total score 14    Patient Active Problem List   Diagnosis Date Noted  . Essential hypertension 08/28/2018  . Osteopenia 08/28/2018  . Hyperlipidemia 10/27/2014  . Allergic rhinitis 10/27/2014  . Adult hypothyroidism 10/13/2014      Current Outpatient Medications:  .  aspirin 81 MG tablet, Take 1 tablet by mouth daily., Disp: , Rfl:  .  Calcium-Phosphorus-Vitamin D 100-50-100 MG-MG-UNIT CHEW, Chew 1 tablet by mouth., Disp: , Rfl:  .  cyanocobalamin 100 MCG tablet, Take 100 mcg by mouth daily., Disp: , Rfl:  .  fluticasone (FLONASE) 50 MCG/ACT nasal spray, Place 1 spray into both nostrils daily., Disp: 16 g, Rfl: 3 .  levothyroxine (SYNTHROID) 88 MCG tablet, Take 1 tablet (88 mcg total) by mouth daily before breakfast., Disp: 90 tablet, Rfl:  1 .  losartan (COZAAR) 25 MG tablet, TAKE 1/2 TABLET (12.5MG     TOTAL) DAILY, Disp: 90 tablet, Rfl: 0 .  meloxicam (MOBIC) 7.5 MG tablet, Take 1 tablet (7.5 mg total) by mouth daily., Disp: 30 tablet, Rfl: 1 .  MULTIPLE VITAMIN PO, Take by mouth daily., Disp: , Rfl:  .  Omega-3 Fatty Acids (FISH OIL BURP-LESS) 1200 MG CAPS, Take 1 capsule by mouth., Disp: , Rfl:  .  rosuvastatin (CRESTOR) 20 MG tablet, Take 1 tablet (20 mg total) by mouth daily., Disp: 90 tablet, Rfl: 1 .  Multiple Vitamins-Minerals (HAIR SKIN AND NAILS FORMULA PO), Take by mouth. (Patient not taking: Reported on 12/03/2019), Disp: , Rfl:  .  traZODone (DESYREL) 50 MG tablet, TAKE 1/2 - 1 TAB BY MOUTH EVERY DAY AT BEDTIME AS NEEDED FOR SLEEP (Patient not taking: Reported on 12/03/2019), Disp: 60 tablet, Rfl: 0   Allergies  Allergen Reactions  . Codeine Nausea And Vomiting     Social History   Tobacco Use  . Smoking status: Former Smoker    Packs/day: 0.25    Years: 5.00    Pack years: 1.25    Types: Cigarettes    Start date: 05/01/1972    Quit date: 05/01/1977    Years since quitting: 42.6  .  Smokeless tobacco: Never Used  Vaping Use  . Vaping Use: Never used  Substance Use Topics  . Alcohol use: No  . Drug use: No      Chart Review Today: I personally reviewed active problem list, medication list, allergies, family history, social history, health maintenance, notes from last encounter, lab results, imaging with the patient/caregiver today.   Review of Systems 10 Systems reviewed and are negative for acute change except as noted in the HPI.     Objective:   Vitals:   12/03/19 0806  BP: 128/80  Pulse: 74  Resp: 16  Temp: 97.9 F (36.6 C)  TempSrc: Temporal  SpO2: 98%  Weight: 178 lb 14.4 oz (81.1 kg)  Height: 5\' 5"  (1.651 m)    Body mass index is 29.77 kg/m.  Physical Exam Vitals and nursing note reviewed.  Constitutional:      General: She is not in acute distress.    Appearance: Normal  appearance. She is not ill-appearing, toxic-appearing or diaphoretic.  HENT:     Head: Normocephalic and atraumatic.  Eyes:     General:        Right eye: No discharge.        Left eye: No discharge.     Conjunctiva/sclera: Conjunctivae normal.  Cardiovascular:     Rate and Rhythm: Normal rate and regular rhythm.     Pulses: Normal pulses.     Heart sounds: Normal heart sounds.  Pulmonary:     Effort: Pulmonary effort is normal.     Breath sounds: Normal breath sounds.  Abdominal:     General: Bowel sounds are normal.     Palpations: Abdomen is soft.  Skin:    General: Skin is warm and dry.     Coloration: Skin is not jaundiced or pale.  Neurological:     Mental Status: She is alert. Mental status is at baseline.  Psychiatric:        Mood and Affect: Mood normal.        Behavior: Behavior normal.      Results for orders placed or performed in visit on 08/06/19  CBC with Differential/Platelet  Result Value Ref Range   WBC 5.5 3.8 - 10.8 Thousand/uL   RBC 4.44 3.80 - 5.10 Million/uL   Hemoglobin 13.6 11.7 - 15.5 g/dL   HCT 41.5 35 - 45 %   MCV 93.5 80.0 - 100.0 fL   MCH 30.6 27.0 - 33.0 pg   MCHC 32.8 32.0 - 36.0 g/dL   RDW 12.6 11.0 - 15.0 %   Platelets 181 140 - 400 Thousand/uL   MPV 10.2 7.5 - 12.5 fL   Neutro Abs 3,586 1,500 - 7,800 cells/uL   Lymphs Abs 1,381 850 - 3,900 cells/uL   Absolute Monocytes 352 200 - 950 cells/uL   Eosinophils Absolute 160 15 - 500 cells/uL   Basophils Absolute 22 0 - 200 cells/uL   Neutrophils Relative % 65.2 %   Total Lymphocyte 25.1 %   Monocytes Relative 6.4 %   Eosinophils Relative 2.9 %   Basophils Relative 0.4 %  COMPLETE METABOLIC PANEL WITH GFR  Result Value Ref Range   Glucose, Bld 103 (H) 65 - 99 mg/dL   BUN 23 7 - 25 mg/dL   Creat 0.92 0.60 - 0.93 mg/dL   GFR, Est Non African American 63 > OR = 60 mL/min/1.20m2   GFR, Est African American 73 > OR = 60 mL/min/1.61m2   BUN/Creatinine Ratio NOT APPLICABLE 6 -  22 (calc)     Sodium 145 135 - 146 mmol/L   Potassium 4.3 3.5 - 5.3 mmol/L   Chloride 108 98 - 110 mmol/L   CO2 31 20 - 32 mmol/L   Calcium 9.4 8.6 - 10.4 mg/dL   Total Protein 6.9 6.1 - 8.1 g/dL   Albumin 4.3 3.6 - 5.1 g/dL   Globulin 2.6 1.9 - 3.7 g/dL (calc)   AG Ratio 1.7 1.0 - 2.5 (calc)   Total Bilirubin 0.7 0.2 - 1.2 mg/dL   Alkaline phosphatase (APISO) 38 37 - 153 U/L   AST 19 10 - 35 U/L   ALT 17 6 - 29 U/L  Lipid panel  Result Value Ref Range   Cholesterol 186 <200 mg/dL   HDL 51 > OR = 50 mg/dL   Triglycerides 115 <150 mg/dL   LDL Cholesterol (Calc) 112 (H) mg/dL (calc)   Total CHOL/HDL Ratio 3.6 <5.0 (calc)   Non-HDL Cholesterol (Calc) 135 (H) <130 mg/dL (calc)  Hemoglobin A1c  Result Value Ref Range   Hgb A1c MFr Bld 5.1 <5.7 % of total Hgb   Mean Plasma Glucose 100 (calc)   eAG (mmol/L) 5.5 (calc)  TSH  Result Value Ref Range   TSH 1.42 0.40 - 4.50 mIU/L       Assessment & Plan:   1. Daytime sleepiness New sx over the past 2 months, no other focal sx or changes, she would like sleep study/referral  - [facility] Sleep Study; Future  2. Snoring - [facility] Sleep Study; Future   We did discuss doing some lab work to r/o other causes of sleepiness/fatigue, but for now pt would like to start with study, and f/up if not positive.      Delsa Grana, PA-C 12/03/19 8:36 AM

## 2019-12-26 ENCOUNTER — Telehealth: Payer: Self-pay

## 2019-12-26 NOTE — Telephone Encounter (Signed)
Confirmed screened  °

## 2019-12-30 ENCOUNTER — Other Ambulatory Visit: Payer: Self-pay

## 2019-12-30 ENCOUNTER — Ambulatory Visit (INDEPENDENT_AMBULATORY_CARE_PROVIDER_SITE_OTHER): Payer: Medicare Other | Admitting: Internal Medicine

## 2019-12-30 ENCOUNTER — Encounter: Payer: Self-pay | Admitting: Internal Medicine

## 2019-12-30 DIAGNOSIS — G4719 Other hypersomnia: Secondary | ICD-10-CM | POA: Diagnosis not present

## 2019-12-30 DIAGNOSIS — Z87891 Personal history of nicotine dependence: Secondary | ICD-10-CM | POA: Diagnosis not present

## 2019-12-30 NOTE — Patient Instructions (Signed)

## 2019-12-30 NOTE — Progress Notes (Addendum)
[facility] [place], [place] [address]  Pulmonary Sleep Medicine   Office Visit Note  Patient Name: Paige [name] DOB: 04/28/1946 MRN [id number]  Date of Service: 12/30/2019  Complaints/HPI: Patient is here to establish care for pulmonary care. She was encouraged to be seen by pulmonary from her PCP for possible OSA She complains of excessive daytime sleepiness everyday feels fatigued and has a lack of energy For instance, when she sits down to read or to watch tv in the evening she will easily drift off to sleep Has been told by her husband that she snores through the night in her sleep Denies headaches upon awakening or waking up in the night gasping for air 1.25 pack year history Denies allergies, history of asthma or other chronic respiratory conditions  ROS  General: (-) fever, (-) chills, (-) night sweats, (-) weakness Skin: (-) rashes, (-) itching,. Eyes: (-) visual changes, (-) redness, (-) itching. Nose and Sinuses: (-) nasal stuffiness or itchiness, (-) postnasal drip, (-) nosebleeds, (-) sinus trouble. Mouth and Throat: (-) sore throat, (-) hoarseness. Neck: (-) swollen glands, (-) enlarged thyroid, (-) neck pain. Respiratory: - cough, (-) bloody sputum, - shortness of breath, - wheezing. Cardiovascular: - ankle swelling, (-) chest pain. Lymphatic: (-) lymph node enlargement. Neurologic: (-) numbness, (-) tingling. Psychiatric: (-) anxiety, (-) depression   Current Medication: Outpatient Encounter Medications as of 12/30/2019  Medication Sig  . aspirin 81 MG tablet Take 1 tablet by mouth daily.  . Calcium-Phosphorus-Vitamin D 100-50-100 MG-MG-UNIT CHEW Chew 1 tablet by mouth.  . cyanocobalamin 100 MCG tablet Take 100 mcg by mouth daily.  . fluticasone (FLONASE) 50 MCG/ACT nasal spray Place 1 spray into both nostrils daily.  Marland Kitchen levothyroxine (SYNTHROID) 88 MCG tablet Take 1 tablet (88 mcg total) by mouth daily before breakfast.  .  losartan (COZAAR) 25 MG tablet TAKE 1/2 TABLET (12.5MG     TOTAL) DAILY  . meloxicam (MOBIC) 7.5 MG tablet Take 1 tablet (7.5 mg total) by mouth daily.  . MULTIPLE VITAMIN PO Take by mouth daily.  . Multiple Vitamins-Minerals (HAIR SKIN AND NAILS FORMULA PO) Take by mouth.   . Omega-3 Fatty Acids (FISH OIL BURP-LESS) 1200 MG CAPS Take 1 capsule by mouth.  . rosuvastatin (CRESTOR) 20 MG tablet Take 1 tablet (20 mg total) by mouth daily.  . traZODone (DESYREL) 50 MG tablet TAKE 1/2 - 1 TAB BY MOUTH EVERY DAY AT BEDTIME AS NEEDED FOR SLEEP   No facility-administered encounter medications on file as of 12/30/2019.    Surgical History: History reviewed. No pertinent surgical history.  Medical History: Past Medical History:  Diagnosis Date  . Hyperlipidemia   . Hypertension   . Thyroid disease     Family History: Family History  Problem Relation Age of Onset  . Cancer Mother   . Diabetes Father   . Stroke Father   . Breast cancer Neg Hx     Social History: Social History   Socioeconomic History  . Marital status: Married    Spouse name: Not on file  . Number of children: 0  . Years of education: Not on file  . Highest education level: High school graduate  Occupational History  . Occupation: Retired  . Occupation: Armed forces operational officer  Tobacco Use  . Smoking status: Former Smoker    Packs/day: 0.25    Years: 5.00    Pack years: 1.25    Types: Cigarettes    Start date: 05/01/1972  Quit date: 05/01/1977    Years since quitting: 42.6  . Smokeless tobacco: Never Used  Vaping Use  . Vaping Use: Never used  Substance and Sexual Activity  . Alcohol use: No  . Drug use: No  . Sexual activity: Yes    Partners: Male  Other Topics Concern  . Not on file  Social History Narrative   Second marriage, widow from first marriage at age 25 and her current husband had two teenagers who she help raise    Social Determinants of Health   Financial Resource Strain: Low Risk   .  Difficulty of Paying Living Expenses: Not hard at all  Food Insecurity: No Food Insecurity  . Worried About Charity fundraiser in the Last Year: Never true  . Ran Out of Food in the Last Year: Never true  Transportation Needs: No Transportation Needs  . Lack of Transportation (Medical): No  . Lack of Transportation (Non-Medical): No  Physical Activity: Sufficiently Active  . Days of Exercise per Week: 7 days  . Minutes of Exercise per Session: 60 min  Stress: No Stress Concern Present  . Feeling of Stress : Not at all  Social Connections: Socially Integrated  . Frequency of Communication with Friends and Family: More than three times a week  . Frequency of Social Gatherings with Friends and Family: More than three times a week  . Attends Religious Services: More than 4 times per year  . Active Member of Clubs or Organizations: Yes  . Attends Archivist Meetings: More than 4 times per year  . Marital Status: Married  Human resources officer Violence: Not At Risk  . Fear of Current or Ex-Partner: No  . Emotionally Abused: No  . Physically Abused: No  . Sexually Abused: No    Vital Signs: Blood pressure 140/70, pulse 68, temperature 97.8 F (36.6 C), resp. rate 16, height 5\' 9"  (1.753 m), weight 179 lb (81.2 kg), SpO2 98 %.  Examination: General Appearance: The patient is well-developed, well-nourished, and in no distress. Skin: Gross inspection of skin unremarkable. Head: normocephalic, no gross deformities. Eyes: no gross deformities noted. ENT: ears appear grossly normal no exudates. Neck: Supple. No thyromegaly. No LAD. Respiratory: Clear throughout. Cardiovascular: Normal S1 and S2 without murmur or rub. Extremities: No cyanosis. pulses are equal. Neurologic: Alert and oriented. No involuntary movements.  LABS: No results found for this or any previous visit (from the past 2160 hour(s)).  Radiology: MM 3D SCREEN BREAST BILATERAL  Result Date: 09/01/2019 CLINICAL  DATA:  Screening. EXAM: DIGITAL SCREENING BILATERAL MAMMOGRAM WITH TOMO AND CAD COMPARISON:  Previous exam(s). ACR Breast Density Category b: There are scattered areas of fibroglandular density. FINDINGS: There are no findings suspicious for malignancy. Images were processed with CAD. IMPRESSION: No mammographic evidence of malignancy. A result letter of this screening mammogram will be mailed directly to the patient. RECOMMENDATION: Screening mammogram in one year. (Code:SM-B-01Y) BI-RADS CATEGORY  1: Negative. Electronically Signed   By: Nolon Nations M.D.   On: 09/01/2019 12:46    Assessment and Plan: Patient Active Problem List   Diagnosis Date Noted  . Essential hypertension 08/28/2018  . Osteopenia 08/28/2018  . Hyperlipidemia 10/27/2014  . Allergic rhinitis 10/27/2014  . Adult hypothyroidism 10/13/2014    1. Excessive daytime sleepiness Will have sleep study to assess for OSA. Will treat accordingly, follow-up after sleep study. - [facility] Sleep Study; Future  2. Former light cigarette smoker (1-9 per day) Will consider CXR/PFT due to former  smoking history. At this times no symptoms of shortness of breath or cough.  General Counseling: I have discussed the findings of the evaluation and examination with Paige [name].  I have also discussed any further diagnostic evaluation thatmay be needed or ordered today. Paige [name] verbalizes understanding of the findings of todays visit. We also reviewed her medications today and discussed drug interactions and side effects including but not limited excessive drowsiness and altered mental states. We also discussed that there is always a risk not just to her but also people around her. she has been encouraged to call the office with any questions or concerns that should arise related to todays visit.  Orders Placed This Encounter  Procedures  . PS[facility] Sleep Study    Standing Status:   Future    Standing Expiration Date:   12/29/2020    Order Specific  Question:   Where should this test be performed:    Answer:   [facility]     Time spent: 68  I have personally obtained a history, examined the patient, evaluated laboratory and imaging results, formulated the assessment and plan and placed orders. This patient was seen by Casey Burkitt AGNP-C in Collaboration with Dr. Devona Konig as a part of collaborative care agreement.    Allyne Gee, MD [facility] Pulmonary and Critical Care Sleep medicine

## 2019-12-31 NOTE — Addendum Note (Signed)
Addended by: Lavera Guise on: 12/31/2019 07:32 PM   Modules accepted: Level of Service

## 2020-01-01 ENCOUNTER — Other Ambulatory Visit: Payer: Self-pay | Admitting: Family Medicine

## 2020-01-01 DIAGNOSIS — E039 Hypothyroidism, unspecified: Secondary | ICD-10-CM

## 2020-01-01 DIAGNOSIS — E7800 Pure hypercholesterolemia, unspecified: Secondary | ICD-10-CM

## 2020-01-02 ENCOUNTER — Other Ambulatory Visit: Payer: Self-pay | Admitting: Family Medicine

## 2020-01-02 DIAGNOSIS — E039 Hypothyroidism, unspecified: Secondary | ICD-10-CM

## 2020-01-02 DIAGNOSIS — E7800 Pure hypercholesterolemia, unspecified: Secondary | ICD-10-CM

## 2020-01-02 NOTE — Telephone Encounter (Signed)
Requested Prescriptions  Pending Prescriptions Disp Refills   levothyroxine (SYNTHROID) 88 MCG tablet [Pharmacy Med Name: LEVOTHYROXIN TAB 88MCG] 90 tablet 2    Sig: TAKE 1 TABLET DAILY BEFORE BREAKFAST     Endocrinology:  Hypothyroid Agents Failed - 01/02/2020  8:09 PM      Failed - TSH needs to be rechecked within 3 months after an abnormal result. Refill until TSH is due.      Passed - TSH in normal range and within 360 days    TSH  Date Value Ref Range Status  08/06/2019 1.42 0.40 - 4.50 mIU/L Final         Passed - Valid encounter within last 12 months    Recent Outpatient Visits          1 month ago Daytime sleepiness   [facility] [place], Kristeen Miss, PA-C   4 months ago Acute right ankle pain   [facility] Delsa Grana, PA-C   4 months ago Essential hypertension   [facility] Steele Sizer, MD   10 months ago Need for influenza vaccination   [facility] Hubbard Hartshorn, FNP   1 year ago Essential hypertension   [place], [place]      Future Appointments            In 1 month Steele Sizer, MD [facility], [place]   In 2 months  [facility], [facility]            rosuvastatin (CRESTOR) 20 MG tablet [Pharmacy Med Name: ROSUVASTATIN TAB 20MG ] 90 tablet 1    Sig: TAKE 1 TABLET DAILY     Cardiovascular:  Antilipid - Statins Failed - 01/02/2020  8:09 PM      Failed - LDL in normal range and within 360 days    LDL Cholesterol (Calc)  Date Value Ref Range Status  08/06/2019 112 (H) mg/dL (calc) Final    Comment:    Reference range: <100 . Desirable range <100 mg/dL for primary prevention;   <70 mg/dL for patients with CHD or diabetic patients  with > or = 2 CHD risk factors. Marland Kitchen LDL-C is now calculated using the Martin-Hopkins  calculation, which is a validated novel method providing  better accuracy than the Friedewald  equation in the  estimation of LDL-C.  Cresenciano Genre et al. Annamaria Helling. 3149;702(63): 2061-2068  (http://education.[employer].com/faq/FAQ164)          Passed - Total Cholesterol in normal range and within 360 days    Cholesterol, Total  Date Value Ref Range Status  09/07/2016 149 100 - 199 mg/dL Final   Cholesterol  Date Value Ref Range Status  08/06/2019 186 <200 mg/dL Final         Passed - HDL in normal range and within 360 days    HDL  Date Value Ref Range Status  08/06/2019 51 > OR = 50 mg/dL Final  09/07/2016 48 >39 mg/dL Final         Passed - Triglycerides in normal range and within 360 days    Triglycerides  Date Value Ref Range Status  08/06/2019 115 <150 mg/dL Final         Passed - Patient is not pregnant      Passed - Valid encounter within last 12 months    Recent Outpatient Visits          1 month ago Daytime sleepiness  [facility], [place], PA-C   4 months ago Acute right ankle pain   [facility] Delsa Grana, [place]   4 months ago Essential hypertension   [facility] Steele Sizer, MD   10 months ago Need for influenza vaccination   [place], FNP   1 year ago Essential hypertension   [place], [place]      Future Appointments            In 1 month Ancil Boozer, Drue Stager, MD [facility], [place]   In 2 months  [facility], [facility]

## 2020-01-12 ENCOUNTER — Telehealth: Payer: Self-pay | Admitting: *Deleted

## 2020-01-12 NOTE — Chronic Care Management (AMB) (Signed)
  Chronic Care Management   Note  01/12/2020 Name: Paige [name] MRN: [id number] DOB: [birth year]  Paige [name] is a 74 y.o. year old female who is a primary care patient of Delsa Grana, [place]. I reached out to Paige [name] by phone today in response to a referral sent by [name]'s health plan.     [name] was given information about Chronic Care Management services today including:  1. CCM service includes personalized support from designated clinical staff supervised by her physician, including individualized plan of care and coordination with other care providers 2. 24/7 contact phone numbers for assistance for urgent and routine care needs. 3. Service will only be billed when office clinical staff spend 20 minutes or more in a month to coordinate care. 4. Only one practitioner may furnish and bill the service in a calendar month. 5. The patient may stop CCM services at any time (effective at the end of the month) by phone call to the office staff. 6. The patient will be responsible for cost sharing (co-pay) of up to 20% of the service fee (after annual deductible is met).  Patient agreed to services and verbal consent obtained.   Follow up plan: Telephone appointment with care management team member scheduled for: 01/21/2020  [place] [facility]

## 2020-01-15 ENCOUNTER — Encounter (INDEPENDENT_AMBULATORY_CARE_PROVIDER_SITE_OTHER): Payer: Medicare Other | Admitting: Internal Medicine

## 2020-01-15 DIAGNOSIS — G4733 Obstructive sleep apnea (adult) (pediatric): Secondary | ICD-10-CM | POA: Diagnosis not present

## 2020-01-18 NOTE — Procedures (Signed)
[place] Report Part I                                                               Phone: [phone] Fax: [phone]  Patient Name: Paige [name], Paige [name] Acquisition Number: [id number]  Date of Birth: 07/05/1945 Acquisition Date: 01/15/2020  Referring Physician: Theodoro Grist, ACNP-C     History: The patient is a 74 year old  female who was referred for evaluation of possible sleep apnea. Medical History: ???Hypertension, high cholesterol, hypothyroidism??.  Medications: ??calcium, cyanocobalamin, Flonase, levothyroxine, melixicam, Trazodone???.  Procedure: This routine overnight polysomnogram was performed on the [place] 5 using the standard diagnostic protocol. This included 6 channels of EEG, 2 channels of EOG, chin EMG, bilateral anterior tibialis EMG, nasal/oral thermistor, PTAF (nasal pressure transducer), chest and abdominal wall movements, EKG, and pulse oximetry.  Description: The total recording time was 438.9 minutes. The total sleep time was 310.5 minutes. There were a total of 103.9 minutes of wakefulness after sleep onset for a?reduced??sleep efficiency of 70.7%. The latency to sleep onset was within normal limits?at 24.5 minutes. The R sleep onset latency was short at 33.0 minutes.??? Sleep parameters, as a percentage of the total sleep time, demonstrated 20.9% of sleep was in N1 sleep, 61.0% N2, 1.8% N3 and 16.3% R sleep. There were a total of 85 arousals for an arousal index of 16.4 arousals per hour of sleep that was ???elevated.  Respiratory monitoring demonstrated mild to moderate snoring in all positions. There were 55 apneas and hypopneas for an Apnea Hypopnea Index of 10.6 apneas and hypopneas per hour of sleep. The REM related apnea hypopnea index was 30.9/hr of REM sleep compared to a NREM AHI of 6.7/hr.  The average duration of the respiratory events was 27.5 seconds with a maximum duration of 45.0 seconds. The respiratory events occurred in  all positions, however they were more frequent in the supine position with an AHI of 13.0. The respiratory events were associated with peripheral oxygen desaturations on the average to 88%. The lowest oxygen desaturation associated with a respiratory event was 80%. Additionally, the baseline oxygen saturation during wakefulness was 96%, during NREM sleep averaged 94%, and during REM sleep averaged  93%. The total duration of oxygen < 90% was 6.6 minutes and <80% was 0.1 minutes.  Cardiac monitoring- did not demonstrate transient cardiac decelerations associated with the apneas. There were no significant cardiac rhythm irregularities.   Periodic limb movement monitoring- demonstrated that there were 262 periodic limb movements for a periodic limb movement index of 50.6 periodic limb movements per hour of sleep.   Impression: ???This routine overnight polysomnogram demonstrated significant obstructive sleep apnea with an overall Apnea Hypopnea Index of 10.6 apneas and hypopneas per hour of sleep. ?The apnea was most severe during supine, REM sleep.  There was a significantly elevated periodic limb movement index of 50.6 periodic limb movements per hour of sleep. Sometimes these limb movements subside when the apnea is controlled. Clinical correlation is suggested.   There was a reduced sleep efficiency with an?elevated arousal index???and reduced percentages of REM and N3 sleep. These findings would appear to be due to the combination of obstructive sleep apnea and periodic limb movements.   ??Recommendations:    1. CPAP titration study is  indicated in this case of MILD obstructive sleep apnea with an overall AHI of 10.6 per hour. The AHI was significantly worse during Stage R sleep. 2. Nasal Decongestants and antihistamines may be of help for increased upper airway resistance. 3. Weight loss through dietary and lifestyle modifications to include exercise is recommended in the presence of obesity with  a BMI of 29.6. 4. A search for and treatment of underlying cardiopulmonary disease is suggested in the setting of desaturations without significant sleep disordered breathing. 5. Alternative treatment options if the patient is not willing or is unable to use CPAP include oral appliances as well as surgical intervention in the right clinical setting. 6. Clinical correlation is recommended. Please feel free to call the office for any further questions or assistance in the care of this patient.    Allyne Gee, MD, [facility] Diplomate ABMS Pulmonary, Critical Care Sleep Medicine  Electronically reviewed and digitally signed

## 2020-01-20 ENCOUNTER — Encounter: Payer: Self-pay | Admitting: Internal Medicine

## 2020-01-20 ENCOUNTER — Telehealth: Payer: Self-pay

## 2020-01-20 DIAGNOSIS — G4733 Obstructive sleep apnea (adult) (pediatric): Secondary | ICD-10-CM

## 2020-01-20 LAB — HM MAMMOGRAPHY: HM Mammogram: NORMAL (ref 0–4)

## 2020-01-20 NOTE — Progress Notes (Signed)
Scanned in ss from FG

## 2020-01-20 NOTE — Telephone Encounter (Signed)
Confirmed patient appt for 01/22/20 

## 2020-01-21 ENCOUNTER — Other Ambulatory Visit: Payer: Self-pay

## 2020-01-21 ENCOUNTER — Ambulatory Visit: Payer: Medicare Other | Admitting: Pharmacist

## 2020-01-21 DIAGNOSIS — I10 Essential (primary) hypertension: Secondary | ICD-10-CM

## 2020-01-21 DIAGNOSIS — E7800 Pure hypercholesterolemia, unspecified: Secondary | ICD-10-CM

## 2020-01-21 NOTE — Chronic Care Management (AMB) (Signed)
Chronic Care Management Pharmacy  Name: Paige [name]  MRN: [id number] DOB: [birth year]  Chief Complaint/ HPI  Paige [name],  74 y.o. , female presents for their Initial CCM visit with the clinical pharmacist via telephone due to COVID-19 Pandemic.  PCP : Paige Grana, PA-C  Their chronic conditions include: HTN, HLD  Office Visits: 8/4 somnolence, [place], BP 128/80 P 74 Wt 179 BMI 29.8, EPS 14, referred to sleep study 4/28 ankle pain, [place], BP 122/78 P 62 Wt 182 BMI 30.3, referral to specialist  Consult Visit: 5/26 achilles, [place], splint, Voltaren and OTC tx only  Medications: Outpatient Encounter Medications as of 01/21/2020  Medication Sig  . aspirin 81 MG tablet Take 1 tablet by mouth daily.  . Calcium-Phosphorus-Vitamin D 100-50-100 MG-MG-UNIT CHEW Chew 1 tablet by mouth.  . cyanocobalamin 100 MCG tablet Take 1,000 mcg by mouth daily.   . fluticasone (FLONASE) 50 MCG/ACT nasal spray Place 1 spray into both nostrils daily. (Patient taking differently: Place 1 spray into both nostrils daily. rarely)  . levothyroxine (SYNTHROID) 88 MCG tablet TAKE 1 TABLET DAILY BEFORE BREAKFAST  . losartan (COZAAR) 25 MG tablet TAKE 1/2 TABLET (12.$RemoveBefore'5MG'oMBTExwMXLicm$     TOTAL) DAILY  . MULTIPLE VITAMIN PO Take by mouth daily.  . Multiple Vitamins-Minerals (HAIR SKIN AND NAILS FORMULA PO) Take by mouth.   . Omega-3 Fatty Acids (FISH OIL BURP-LESS) 1200 MG CAPS Take 1 capsule by mouth.  . rosuvastatin (CRESTOR) 20 MG tablet TAKE 1 TABLET DAILY  . Turmeric 500 MG TABS Take 1 tablet by mouth daily.  . meloxicam (MOBIC) 7.5 MG tablet Take 1 tablet (7.5 mg total) by mouth daily.  . traZODone (DESYREL) 50 MG tablet TAKE 1/2 - 1 TAB BY MOUTH EVERY DAY AT BEDTIME AS NEEDED FOR SLEEP   No facility-administered encounter medications on file as of 01/21/2020.      Financial Resource Strain: Low Risk   . Difficulty of Paying Living Expenses: Not hard at all    Current Diagnosis/Assessment:  Goals  Addressed            This Visit's Progress   . Chronic Care Management       CARE PLAN ENTRY (see longitudinal plan of care for additional care plan information)  Current Barriers:  . Chronic Disease Management support, education, and care coordination needs related to Hypertension and Hyperlipidemia   Hypertension BP Readings from Last 3 Encounters:  01/22/20 (!) 180/92  01/22/20 (!) 178/80  12/30/19 140/70   . Pharmacist Clinical Goal(s): o Over the next 90 days, patient will work with PharmD and providers to maintain BP goal <140/90. Readings above not typical. . Current regimen:  o Losartan 12.$RemoveBefor'5mg'zEiQJwJUqdbQ$  daily . Interventions: o None . Patient self care activities - Over the next 90 days, patient will: o Check BP daily, document, and provide at future appointments o Ensure daily salt intake < 2300 mg/day o Inform PharmD and/or provider about other high BP readings  Hyperlipidemia Lab Results  Component Value Date/Time   LDLCALC 112 (H) 08/06/2019 08:28 AM   . Pharmacist Clinical Goal(s): o Over the next 90 days, patient will work with PharmD and providers to achieve LDL goal < 100 . Current regimen:  o Crestor $RemoveB'20mg'NWKYshbN$  daily . Interventions: o Start CoQ10 $RemoveBef'100mg'shFoPwQgpE$  1 tab by mouth daily (200, $RemoveBef'300mg'dewcNoomjl$  also OK) . Patient self care activities - Over the next 90 days, patient will: o Report unexplained muscle pain  Medication management . Pharmacist Clinical Goal(s): o Over the  next 90 days, patient will work with PharmD and providers to maintain optimal medication adherence . Current pharmacy: [facility] . Interventions o Comprehensive medication review performed. o Continue current medication management strategy . Patient self care activities - Over the next 90 days, patient will: o Focus on medication adherence by checking BP daily o Take medications as prescribed o Report any questions or concerns to PharmD and/or provider(s)  Initial goal documentation       Hypertension     BP goal is:  <140/90  Office blood pressures are  BP Readings from Last 3 Encounters:  01/22/20 (!) 180/92  01/22/20 (!) 178/80  12/30/19 140/70   Patient checks BP at home infrequently Patient home BP readings are ranging: NA  Patient has failed these meds in the past: NA Patient is currently controlled on the following medications:  . Losartan 12.$RemoveBefor'5mg'gygNoIBKHBVO$  daily  We discussed: Losartan made her light headed So reduced dose White coat syndrome Denies current hypotension Sleep study BP 170/??  Plan  Continue current medications   Hyperlipidemia   LDL goal < 100  Lipid Panel     Component Value Date/Time   CHOL 186 08/06/2019 0828   CHOL 149 09/07/2016 0822   TRIG 115 08/06/2019 0828   HDL 51 08/06/2019 0828   HDL 48 09/07/2016 0822   LDLCALC 112 (H) 08/06/2019 0828    Hepatic Function Latest Ref Rng & Units 08/06/2019 09/02/2018 09/06/2017  Total Protein 6.1 - 8.1 g/dL 6.9 7.1 7.2  Albumin 3.6 - 4.8 g/dL - - -  AST 10 - 35 U/L $Remo'19 18 18  'CTktK$ ALT 6 - 29 U/L $Remo'17 15 16  'Xaylg$ Alk Phosphatase 39 - 117 IU/L - - -  Total Bilirubin 0.2 - 1.2 mg/dL 0.7 0.7 0.8     The 10-year ASCVD risk score Mikey Bussing DC Jr., et al., 2013) is: 28.3%   Values used to calculate the score:     Age: 37 years     Sex: Female     Is Non-Hispanic African American: No     Diabetic: No     Tobacco smoker: No     Systolic Blood Pressure: 188 mmHg     Is BP treated: Yes     HDL Cholesterol: 51 mg/dL     Total Cholesterol: 186 mg/dL   Patient has failed these meds in past: NA Patient is currently uncontrolled on the following medications:  . Crestor $RemoveB'20mg'BceOJRbC$  daily  We discussed:   Moderate Myalgias CoQ10 recommendation Water soluble vs fat soluble statins and patient sensitivity Current lab LDL  Plan  Patient start CoQ10 $RemoveBef'100mg'PCgERJHpOP$  daily Continue current medications   Medication Management   Pt uses [place] for all medications Uses pill box? Yes Pt endorses 100% compliance  We discussed:   Sleep study wants CPAP, informed patient who didn't know Recommend increasing Flonase and Claritin Turmeric $RemoveBeforeDEI'500mg'UyFOKPgBAjOHlaAK$  daily  Plan  Continue current medication management strategy  Follow up: 3 month phone visit  Milus Height, PharmD, [place], [facility] [phone]

## 2020-01-22 ENCOUNTER — Ambulatory Visit (INDEPENDENT_AMBULATORY_CARE_PROVIDER_SITE_OTHER): Payer: Medicare Other | Admitting: Internal Medicine

## 2020-01-22 ENCOUNTER — Ambulatory Visit: Payer: Medicare Other

## 2020-01-22 ENCOUNTER — Encounter: Payer: Self-pay | Admitting: Internal Medicine

## 2020-01-22 VITALS — BP 180/92 | HR 68 | Resp 14

## 2020-01-22 VITALS — BP 178/80 | HR 64 | Temp 96.9°F | Resp 16 | Ht 65.5 in | Wt 182.0 lb

## 2020-01-22 DIAGNOSIS — J3089 Other allergic rhinitis: Secondary | ICD-10-CM | POA: Diagnosis not present

## 2020-01-22 DIAGNOSIS — I10 Essential (primary) hypertension: Secondary | ICD-10-CM | POA: Diagnosis not present

## 2020-01-22 DIAGNOSIS — G4733 Obstructive sleep apnea (adult) (pediatric): Secondary | ICD-10-CM

## 2020-01-22 DIAGNOSIS — E039 Hypothyroidism, unspecified: Secondary | ICD-10-CM | POA: Diagnosis not present

## 2020-01-22 NOTE — Patient Instructions (Addendum)
Visit Information  Goals Addressed            This Visit's Progress   . Chronic Care Management       CARE PLAN ENTRY (see longitudinal plan of care for additional care plan information)  Current Barriers:  . Chronic Disease Management support, education, and care coordination needs related to Hypertension and Hyperlipidemia   Hypertension BP Readings from Last 3 Encounters:  01/22/20 (!) 180/92  01/22/20 (!) 178/80  12/30/19 140/70   . Pharmacist Clinical Goal(s): o Over the next 90 days, patient will work with PharmD and providers to maintain BP goal <140/90. Readings above not typical. . Current regimen:  o Losartan 12.5mg  daily . Interventions: o None . Patient self care activities - Over the next 90 days, patient will: o Check BP daily, document, and provide at future appointments o Ensure daily salt intake < 2300 mg/day o Inform PharmD and/or provider about other high BP readings  Hyperlipidemia Lab Results  Component Value Date/Time   LDLCALC 112 (H) 08/06/2019 08:28 AM   . Pharmacist Clinical Goal(s): o Over the next 90 days, patient will work with PharmD and providers to achieve LDL goal < 100 . Current regimen:  o Crestor 20mg  daily . Interventions: o Start CoQ10 100mg  1 tab by mouth daily (200, 300mg  also OK) . Patient self care activities - Over the next 90 days, patient will: o Report unexplained muscle pain  Medication management . Pharmacist Clinical Goal(s): o Over the next 90 days, patient will work with PharmD and providers to maintain optimal medication adherence . Current pharmacy: [facility] . Interventions o Comprehensive medication review performed. o Continue current medication management strategy . Patient self care activities - Over the next 90 days, patient will: o Focus on medication adherence by checking BP daily o Take medications as prescribed o Report any questions or concerns to PharmD and/or provider(s)  Initial goal  documentation        Paige [name] was given information about Chronic Care Management services today including:  1. CCM service includes personalized support from designated clinical staff supervised by her physician, including individualized plan of care and coordination with other care providers 2. 24/7 contact phone numbers for assistance for urgent and routine care needs. 3. Standard insurance, coinsurance, copays and deductibles apply for chronic care management only during months in which we provide at least 20 minutes of these services. Most insurances cover these services at 100%, however patients may be responsible for any copay, coinsurance and/or deductible if applicable. This service may help you avoid the need for more expensive face-to-face services. 4. Only one practitioner may furnish and bill the service in a calendar month. 5. The patient may stop CCM services at any time (effective at the end of the month) by phone call to the office staff.  Patient agreed to services and verbal consent obtained.   Print copy of patient instructions provided.  Telephone follow up appointment with pharmacy team member scheduled for: 3 months  Milus Height, PharmD, [place], [facility] [phone]   Managing Your Hypertension Hypertension is commonly called high blood pressure. This is when the force of your blood pressing against the walls of your arteries is too strong. Arteries are blood vessels that carry blood from your heart throughout your body. Hypertension forces the heart to work harder to pump blood, and may cause the arteries to become narrow or stiff. Having untreated or uncontrolled hypertension can cause heart attack,  stroke, kidney disease, and other problems. What are blood pressure readings? A blood pressure reading consists of a higher number over a lower number. Ideally, your blood pressure should be below 120/80. The first  ("top") number is called the systolic pressure. It is a measure of the pressure in your arteries as your heart beats. The second ("bottom") number is called the diastolic pressure. It is a measure of the pressure in your arteries as the heart relaxes. What does my blood pressure reading mean? Blood pressure is classified into four stages. Based on your blood pressure reading, your health care provider may use the following stages to determine what type of treatment you need, if any. Systolic pressure and diastolic pressure are measured in a unit called mm Hg. Normal  Systolic pressure: below 712.  Diastolic pressure: below 80. Elevated  Systolic pressure: 458-099.  Diastolic pressure: below 80. Hypertension stage 1  Systolic pressure: 833-825.  Diastolic pressure: 05-39. Hypertension stage 2  Systolic pressure: 767 or above.  Diastolic pressure: 90 or above. What health risks are associated with hypertension? Managing your hypertension is an important responsibility. Uncontrolled hypertension can lead to:  A heart attack.  A stroke.  A weakened blood vessel (aneurysm).  Heart failure.  Kidney damage.  Eye damage.  Metabolic syndrome.  Memory and concentration problems. What changes can I make to manage my hypertension? Hypertension can be managed by making lifestyle changes and possibly by taking medicines. Your health care provider will help you make a plan to bring your blood pressure within a normal range. Eating and drinking   Eat a diet that is high in fiber and potassium, and low in salt (sodium), added sugar, and fat. An example eating plan is called the DASH (Dietary Approaches to Stop Hypertension) diet. To eat this way: ? Eat plenty of fresh fruits and vegetables. Try to fill half of your plate at each meal with fruits and vegetables. ? Eat whole grains, such as whole wheat pasta, brown rice, or whole grain bread. Fill about one quarter of your plate with  whole grains. ? Eat low-fat diary products. ? Avoid fatty cuts of meat, processed or cured meats, and poultry with skin. Fill about one quarter of your plate with lean proteins such as fish, chicken without skin, beans, eggs, and tofu. ? Avoid premade and processed foods. These tend to be higher in sodium, added sugar, and fat.  Reduce your daily sodium intake. Most people with hypertension should eat less than 1,500 mg of sodium a day.  Limit alcohol intake to no more than 1 drink a day for nonpregnant women and 2 drinks a day for men. One drink equals 12 oz of beer, 5 oz of wine, or 1 oz of hard liquor. Lifestyle  Work with your health care provider to maintain a healthy body weight, or to lose weight. Ask what an ideal weight is for you.  Get at least 30 minutes of exercise that causes your heart to beat faster (aerobic exercise) most days of the week. Activities may include walking, swimming, or biking.  Include exercise to strengthen your muscles (resistance exercise), such as weight lifting, as part of your weekly exercise routine. Try to do these types of exercises for 30 minutes at least 3 days a week.  Do not use any products that contain nicotine or tobacco, such as cigarettes and e-cigarettes. If you need help quitting, ask your health care provider.  Control any long-term (chronic) conditions you have, such as  high cholesterol or diabetes. Monitoring  Monitor your blood pressure at home as told by your health care provider. Your personal target blood pressure may vary depending on your medical conditions, your age, and other factors.  Have your blood pressure checked regularly, as often as told by your health care provider. Working with your health care provider  Review all the medicines you take with your health care provider because there may be side effects or interactions.  Talk with your health care provider about your diet, exercise habits, and other lifestyle factors  that may be contributing to hypertension.  Visit your health care provider regularly. Your health care provider can help you create and adjust your plan for managing hypertension. Will I need medicine to control my blood pressure? Your health care provider may prescribe medicine if lifestyle changes are not enough to get your blood pressure under control, and if:  Your systolic blood pressure is 130 or higher.  Your diastolic blood pressure is 80 or higher. Take medicines only as told by your health care provider. Follow the directions carefully. Blood pressure medicines must be taken as prescribed. The medicine does not work as well when you skip doses. Skipping doses also puts you at risk for problems. Contact a health care provider if:  You think you are having a reaction to medicines you have taken.  You have repeated (recurrent) headaches.  You feel dizzy.  You have swelling in your ankles.  You have trouble with your vision. Get help right away if:  You develop a severe headache or confusion.  You have unusual weakness or numbness, or you feel faint.  You have severe pain in your chest or abdomen.  You vomit repeatedly.  You have trouble breathing. Summary  Hypertension is when the force of blood pumping through your arteries is too strong. If this condition is not controlled, it may put you at risk for serious complications.  Your personal target blood pressure may vary depending on your medical conditions, your age, and other factors. For most people, a normal blood pressure is less than 120/80.  Hypertension is managed by lifestyle changes, medicines, or both. Lifestyle changes include weight loss, eating a healthy, low-sodium diet, exercising more, and limiting alcohol. This information is not intended to replace advice given to you by your health care provider. Make sure you discuss any questions you have with your health care provider. Document Revised: 08/09/2018  Document Reviewed: 03/15/2016 Elsevier Patient Education  [place].

## 2020-01-22 NOTE — Progress Notes (Signed)
Patient ID: Paige [name], female    DOB: 05/29/1945, 74 y.o.   MRN: [id number]  PCP: Delsa Grana, PA-C  Chief Complaint  Patient presents with  . Hypertension    BP has been running 170/180's this week    Subjective:   Paige [name] is a 74 y.o. female, presents to clinic with CC of the following: She had a sleep study done and was told her blood pressure was in the 170's when she checked it herself it was in the 180's.  She brings in several readings with her today she has a older arm blood pressure cuff and has tried her husband's wrist blood pressure cuff most of her readings are systolic 170Y to 174B BP elevated over the past two weeks while she was doing sleep study. She has HTN well controlled with low dose of losartan she takes 12.5 mg daily  Blood pressure here is well controlled. BP Readings from Last 5 Encounters:  01/23/20 128/78  01/22/20 (!) 180/92  01/22/20 (!) 178/80  12/30/19 140/70  12/03/19 128/80   Pt denies CP, SOB, exertional sx, LE edema, palpitation, Ha's, visual disturbances, lightheadedness, hypotension, syncope.  She did take a full losartan (25 mg) this morning but took her normal 12.5 mg dose yesterday    Patient Active Problem List   Diagnosis Date Noted  . Essential hypertension 08/28/2018  . Osteopenia 08/28/2018  . Hyperlipidemia 10/27/2014  . Allergic rhinitis 10/27/2014  . Adult hypothyroidism 10/13/2014      Current Outpatient Medications:  .  aspirin 81 MG tablet, Take 1 tablet by mouth daily., Disp: , Rfl:  .  Calcium-Phosphorus-Vitamin D 100-50-100 MG-MG-UNIT CHEW, Chew 1 tablet by mouth., Disp: , Rfl:  .  cyanocobalamin 100 MCG tablet, Take 1,000 mcg by mouth daily. , Disp: , Rfl:  .  fluticasone (FLONASE) 50 MCG/ACT nasal spray, Place 1 spray into both nostrils daily. (Patient taking differently: Place 1 spray into both nostrils daily. rarely), Disp: 16 g, Rfl: 3 .  levothyroxine (SYNTHROID) 88 MCG tablet,  TAKE 1 TABLET DAILY BEFORE BREAKFAST, Disp: 90 tablet, Rfl: 2 .  losartan (COZAAR) 25 MG tablet, TAKE 1/2 TABLET (12.5MG     TOTAL) DAILY, Disp: 90 tablet, Rfl: 0 .  meloxicam (MOBIC) 7.5 MG tablet, Take 1 tablet (7.5 mg total) by mouth daily., Disp: 30 tablet, Rfl: 1 .  MULTIPLE VITAMIN PO, Take by mouth daily., Disp: , Rfl:  .  Multiple Vitamins-Minerals (HAIR SKIN AND NAILS FORMULA PO), Take by mouth. , Disp: , Rfl:  .  Omega-3 Fatty Acids (FISH OIL BURP-LESS) 1200 MG CAPS, Take 1 capsule by mouth., Disp: , Rfl:  .  rosuvastatin (CRESTOR) 20 MG tablet, TAKE 1 TABLET DAILY, Disp: 90 tablet, Rfl: 2 .  Turmeric 500 MG TABS, Take 1 tablet by mouth daily., Disp: , Rfl:    Allergies  Allergen Reactions  . Codeine Nausea And Vomiting     Social History   Tobacco Use  . Smoking status: Former Smoker    Packs/day: 0.25    Years: 5.00    Pack years: 1.25    Types: Cigarettes    Start date: 05/01/1972    Quit date: 05/01/1977    Years since quitting: 42.7  . Smokeless tobacco: Never Used  Vaping Use  . Vaping Use: Never used  Substance Use Topics  . Alcohol use: No  . Drug use: No      Chart Review Today: I personally reviewed active problem list,  medication list, allergies, family history, social history, health maintenance, notes from last encounter, lab results, imaging with the patient/caregiver today.   Review of Systems 10 Systems reviewed and are negative for acute change except as noted in the HPI.     Objective:   Vitals:   01/23/20 0823 01/23/20 0849 01/23/20 0850  BP: (!) 142/78 126/72 128/78  Pulse: 72    Resp: 16    Temp: 98.3 F (36.8 C)    TempSrc: Oral    SpO2: 99%    Weight: 180 lb 6.4 oz (81.8 kg)    Height: 5\' 9"  (1.753 m)      Body mass index is 26.64 kg/m.  Physical Exam Vitals and nursing note reviewed.  Constitutional:      General: She is not in acute distress.    Appearance: Normal appearance. She is not ill-appearing, toxic-appearing or  diaphoretic.  HENT:     Head: Normocephalic and atraumatic.  Cardiovascular:     Rate and Rhythm: Normal rate and regular rhythm.     Pulses: Normal pulses.     Heart sounds: Normal heart sounds.  Pulmonary:     Effort: Pulmonary effort is normal.     Breath sounds: Normal breath sounds.  Abdominal:     General: Bowel sounds are normal.  Musculoskeletal:     Right lower leg: No edema.     Left lower leg: No edema.  Skin:    General: Skin is warm and dry.  Neurological:     Mental Status: She is alert. Mental status is at baseline.     Gait: Gait normal.  Psychiatric:        Mood and Affect: Mood normal.        Behavior: Behavior normal.      Results for orders placed or performed in visit on 08/06/19  CBC with Differential/Platelet  Result Value Ref Range   WBC 5.5 3.8 - 10.8 Thousand/uL   RBC 4.44 3.80 - 5.10 Million/uL   Hemoglobin 13.6 11.7 - 15.5 g/dL   HCT 41.5 35 - 45 %   MCV 93.5 80.0 - 100.0 fL   MCH 30.6 27.0 - 33.0 pg   MCHC 32.8 32.0 - 36.0 g/dL   RDW 12.6 11.0 - 15.0 %   Platelets 181 140 - 400 Thousand/uL   MPV 10.2 7.5 - 12.5 fL   Neutro Abs 3,586 1,500 - 7,800 cells/uL   Lymphs Abs 1,381 850 - 3,900 cells/uL   Absolute Monocytes 352 200 - 950 cells/uL   Eosinophils Absolute 160 15 - 500 cells/uL   Basophils Absolute 22 0 - 200 cells/uL   Neutrophils Relative % 65.2 %   Total Lymphocyte 25.1 %   Monocytes Relative 6.4 %   Eosinophils Relative 2.9 %   Basophils Relative 0.4 %  COMPLETE METABOLIC PANEL WITH GFR  Result Value Ref Range   Glucose, Bld 103 (H) 65 - 99 mg/dL   BUN 23 7 - 25 mg/dL   Creat 0.92 0.60 - 0.93 mg/dL   GFR, Est Non African American 63 > OR = 60 mL/min/1.90m2   GFR, Est African American 73 > OR = 60 mL/min/1.72m2   BUN/Creatinine Ratio NOT APPLICABLE 6 - 22 (calc)   Sodium 145 135 - 146 mmol/L   Potassium 4.3 3.5 - 5.3 mmol/L   Chloride 108 98 - 110 mmol/L   CO2 31 20 - 32 mmol/L   Calcium 9.4 8.6 - 10.4 mg/dL   Total  Protein 6.9  6.1 - 8.1 g/dL   Albumin 4.3 3.6 - 5.1 g/dL   Globulin 2.6 1.9 - 3.7 g/dL (calc)   AG Ratio 1.7 1.0 - 2.5 (calc)   Total Bilirubin 0.7 0.2 - 1.2 mg/dL   Alkaline phosphatase (APISO) 38 37 - 153 U/L   AST 19 10 - 35 U/L   ALT 17 6 - 29 U/L  Lipid panel  Result Value Ref Range   Cholesterol 186 <200 mg/dL   HDL 51 > OR = 50 mg/dL   Triglycerides 115 <150 mg/dL   LDL Cholesterol (Calc) 112 (H) mg/dL (calc)   Total CHOL/HDL Ratio 3.6 <5.0 (calc)   Non-HDL Cholesterol (Calc) 135 (H) <130 mg/dL (calc)  Hemoglobin A1c  Result Value Ref Range   Hgb A1c MFr Bld 5.1 <5.7 % of total Hgb   Mean Plasma Glucose 100 (calc)   eAG (mmol/L) 5.5 (calc)  TSH  Result Value Ref Range   TSH 1.42 0.40 - 4.50 mIU/L       Assessment & Plan:   1. Essential hypertension Patient has had several high blood pressure readings while doing obstructive sleep apnea studies, she has borrowed a blood pressure cuff from her friend and has a wrist cuff at home from her husband she brings in readings today that show a few high readings as well with checking on those devices  -most home blood pressure readings are SBP 130s to 150s She is having a few higher and lower readings so she is not sure if she can trust the cuff.  In the room we did do one reading which was very close to our blood pressure readings in clinic manually by the CMAs, the cuff the second time did not inflate or give her reading and was faulty We reviewed proper technique, factors that can easily influence blood pressure, handout given to the patient regarding this -gave her a chart to record some readings if she would like It is unlikely that one 25 mg dose of losartan would drop her blood pressure this much when she just barely took it before coming to the clinic -we decided to have her resume her normal dose of 12.5 mg daily monitor her blood pressure and if her readings are elevated increase her blood pressure medicine to 25 mg daily and  let me know encouraged her to follow-up in the next couple weeks with a nurse blood pressure recheck appointment  2. Need for influenza vaccination - Flu Vaccine QUAD High Dose(Fluad)      Delsa Grana, PA-C 01/23/20 8:31 AM

## 2020-01-22 NOTE — Patient Instructions (Signed)

## 2020-01-22 NOTE — Progress Notes (Signed)
Patient walked in from pulmonology appointment for sleep study and blood pressure was elevated 178/80 at that office visit.  Patient was concerned.  Rechecked here after setting and was 180/92.  Provider notified and patient was told to take whole pill of Losartan 25mg .  Pt agreed and was scheduled for tomorrow am.  Pt denied SOB, dizziness, headache, chest pain and or irregular heart beat.  Patient was told if any of these developed to go to ER or urgent care.

## 2020-01-22 NOTE — Progress Notes (Signed)
[facility] [place], [place] [address]  Pulmonary Sleep Medicine   Office Visit Note  Patient Name: Paige [name] DOB: [birth year] MRN [id number]    Chief Complaint: Obstructive Sleep Apnea visit  Brief History:  Paige [name] is seen today for OSA The patient has a recent history of sleep apnea. The AHI is 10.6  The patient does not complain of limb movements disrupting sleep.  Patient has been complaining of having excessive daytime somnolence she is sleepy all the time wakes up unrefreshed.  The patient's husband and son have obstructive sleep apnea so therefore she was concerned about herself.  She went for sleep study in the sleep study showed that her AHI was 10.6/h.  Patient also states that she does have some issues with upper airway resistance and increased allergies.  She has had weight gain she also admits to having a high blood pressure.  Her pressure today indeed appears to be not well controlled.  She does feel tired during the daytime and sometimes naps.  She has never fallen asleep while driving.  ROS  General: (-) fever, (-) chills, (-) night sweat Nose and Sinuses: +nasal stuffiness or itchiness, +postnasal drip, (-) nosebleeds, (-) sinus trouble. Mouth and Throat: (-) sore throat, (-) hoarseness. Neck: (-) swollen glands, (-) enlarged thyroid, (-) neck pain. Respiratory: + cough, - shortness of breath, - wheezing. Neurologic: - numbness, - tingling. Psychiatric: - anxiety, - depression   Current Medication: Outpatient Encounter Medications as of 01/22/2020  Medication Sig  . aspirin 81 MG tablet Take 1 tablet by mouth daily.  . Calcium-Phosphorus-Vitamin D 100-50-100 MG-MG-UNIT CHEW Chew 1 tablet by mouth.  . cyanocobalamin 100 MCG tablet Take 1,000 mcg by mouth daily.   . fluticasone (FLONASE) 50 MCG/ACT nasal spray Place 1 spray into both nostrils daily. (Patient taking differently: Place 1 spray into both nostrils daily. rarely)  .  levothyroxine (SYNTHROID) 88 MCG tablet TAKE 1 TABLET DAILY BEFORE BREAKFAST  . losartan (COZAAR) 25 MG tablet TAKE 1/2 TABLET (12.5MG     TOTAL) DAILY  . meloxicam (MOBIC) 7.5 MG tablet Take 1 tablet (7.5 mg total) by mouth daily.  . MULTIPLE VITAMIN PO Take by mouth daily.  . Multiple Vitamins-Minerals (HAIR SKIN AND NAILS FORMULA PO) Take by mouth.   . Omega-3 Fatty Acids (FISH OIL BURP-LESS) 1200 MG CAPS Take 1 capsule by mouth.  . rosuvastatin (CRESTOR) 20 MG tablet TAKE 1 TABLET DAILY  . traZODone (DESYREL) 50 MG tablet TAKE 1/2 - 1 TAB BY MOUTH EVERY DAY AT BEDTIME AS NEEDED FOR SLEEP   No facility-administered encounter medications on file as of 01/22/2020.    Surgical History: History reviewed. No pertinent surgical history.  Medical History: Past Medical History:  Diagnosis Date  . Hyperlipidemia   . Hypertension   . Thyroid disease     Family History: Non contributory to the present illness  Social History: Social History   Socioeconomic History  . Marital status: Married    Spouse name: Not on file  . Number of children: 0  . Years of education: Not on file  . Highest education level: High school graduate  Occupational History  . Occupation: Retired  . Occupation: Armed forces operational officer  Tobacco Use  . Smoking status: Former Smoker    Packs/day: 0.25    Years: 5.00    Pack years: 1.25    Types: Cigarettes    Start date: 05/01/1972    Quit date: 05/01/1977    Years since  quitting: 42.7  . Smokeless tobacco: Never Used  Vaping Use  . Vaping Use: Never used  Substance and Sexual Activity  . Alcohol use: No  . Drug use: No  . Sexual activity: Yes    Partners: Male  Other Topics Concern  . Not on file  Social History Narrative   Second marriage, widow from first marriage at age 25 and her current husband had two teenagers who she help raise    Social Determinants of Health   Financial Resource Strain: Low Risk   . Difficulty of Paying Living Expenses: Not hard  at all  Food Insecurity: No Food Insecurity  . Worried About Charity fundraiser in the Last Year: Never true  . Ran Out of Food in the Last Year: Never true  Transportation Needs: No Transportation Needs  . Lack of Transportation (Medical): No  . Lack of Transportation (Non-Medical): No  Physical Activity: Sufficiently Active  . Days of Exercise per Week: 7 days  . Minutes of Exercise per Session: 60 min  Stress: No Stress Concern Present  . Feeling of Stress : Not at all  Social Connections: Socially Integrated  . Frequency of Communication with Friends and Family: More than three times a week  . Frequency of Social Gatherings with Friends and Family: More than three times a week  . Attends Religious Services: More than 4 times per year  . Active Member of Clubs or Organizations: Yes  . Attends Archivist Meetings: More than 4 times per year  . Marital Status: Married  Human resources officer Violence: Not At Risk  . Fear of Current or Ex-Partner: No  . Emotionally Abused: No  . Physically Abused: No  . Sexually Abused: No    Vital Signs: Blood pressure (!) 178/80, pulse 64, temperature (!) 96.9 F (36.1 C), resp. rate 16, height 5' 5.5" (1.664 m), weight 182 lb (82.6 kg), SpO2 99 %.  Examination: General Appearance: The patient is well-developed, well-nourished, and in no distress. Skin: Gross inspection of skin unremarkable. Head: normocephalic, no gross deformities. Eyes: no gross deformities noted. ENT: ears appear grossly normal Neurologic: Alert and oriented. No involuntary movements.    EPWORTH SLEEPINESS SCALE:  Scale:  (0)= no chance of dozing; (1)= slight chance of dozing; (2)= moderate chance of dozing; (3)= high chance of dozing  Chance  Situtation    Sitting and reading: 0    Watching TV: 3    Sitting Inactive in public: 1    As a passenger in car: 2      Lying down to rest: 1    Sitting and talking: 0    Sitting quielty after lunch: 1     In a car, stopped in traffic: 0   TOTAL SCORE:   8 out of 24    SLEEP STUDIES:  1. Done 01/15/2020 MILD OSA with an AHI of 10.6 per hour    LABS: No results found for this or any previous visit (from the past 2160 hour(s)).  Radiology: MM 3D SCREEN BREAST BILATERAL  Result Date: 09/01/2019 CLINICAL DATA:  Screening. EXAM: DIGITAL SCREENING BILATERAL MAMMOGRAM WITH TOMO AND CAD COMPARISON:  Previous exam(s). ACR Breast Density Category b: There are scattered areas of fibroglandular density. FINDINGS: There are no findings suspicious for malignancy. Images were processed with CAD. IMPRESSION: No mammographic evidence of malignancy. A result letter of this screening mammogram will be mailed directly to the patient. RECOMMENDATION: Screening mammogram in one year. (Code:SM-B-01Y) BI-RADS CATEGORY  1: Negative. Electronically Signed   By: Nolon Nations M.D.   On: 09/01/2019 12:46    No results found.  No results found.    Assessment and Plan: Patient Active Problem List   Diagnosis Date Noted  . Essential hypertension 08/28/2018  . Osteopenia 08/28/2018  . Hyperlipidemia 10/27/2014  . Allergic rhinitis 10/27/2014  . Adult hypothyroidism 10/13/2014     1. OSA confirmed by sleep study she will need to have a CPAP titration I went ahead and ordered this today.  After the CPAP titration is done we will have her fit for mask and started on her own machine. 2. HTN blood pressure is noted to be elevated she will follow-up with her primary care team hopefully once she is on the CPAP she may see an improvement overall in her blood pressure control. 3. Allergic rhinitis gave her the option of getting tested for allergies on she states her allergies are mild and right now she is going to continue using her Nasacort and antihistamines.  Advised her that once she is on the CPAP she should use the Nasacort at nighttime also 4. Hypothyroid per her primary care team monitor her thyroid  levels.  General Counseling: I have discussed the findings of the evaluation and examination with Paige [name].  I have also discussed any further diagnostic evaluation thatmay be needed or ordered today. Paige [name] verbalizes understanding of the findings of todays visit. We also reviewed her medications today and discussed drug interactions and side effects including but not limited excessive drowsiness and altered mental states. We also discussed that there is always a risk not just to her but also people around her. she has been encouraged to call the office with any questions or concerns that should arise related to todays visit.  Orders Placed This Encounter  Procedures  . Cpap titration    Standing Status:   Future    Standing Expiration Date:   01/21/2021    Order Specific Question:   Where should this test be performed:    Answer:   [facility]        I have personally obtained a history, examined the patient, evaluated laboratory and imaging results, formulated the assessment and plan and placed orders.    Allyne Gee, MD [facility] Diplomate ABMS Pulmonary and Critical Care Medicine Sleep medicine

## 2020-01-23 ENCOUNTER — Encounter: Payer: Self-pay | Admitting: Family Medicine

## 2020-01-23 ENCOUNTER — Other Ambulatory Visit: Payer: Self-pay

## 2020-01-23 ENCOUNTER — Ambulatory Visit (INDEPENDENT_AMBULATORY_CARE_PROVIDER_SITE_OTHER): Payer: Medicare Other | Admitting: Family Medicine

## 2020-01-23 VITALS — BP 128/78 | HR 72 | Temp 98.3°F | Resp 16 | Ht 69.0 in | Wt 180.4 lb

## 2020-01-23 DIAGNOSIS — Z23 Encounter for immunization: Secondary | ICD-10-CM

## 2020-01-23 DIAGNOSIS — I10 Essential (primary) hypertension: Secondary | ICD-10-CM | POA: Diagnosis not present

## 2020-01-23 NOTE — Patient Instructions (Signed)
How to Take Your Blood Pressure You can take your blood pressure at home with a machine. You may need to check your blood pressure at home:  To check if you have high blood pressure (hypertension).  To check your blood pressure over time.  To make sure your blood pressure medicine is working. Supplies needed: You will need a blood pressure machine, or monitor. You can buy one at a drugstore or online. When choosing one:  Choose one with an arm cuff.  Choose one that wraps around your upper arm. Only one finger should fit between your arm and the cuff.  Do not choose one that measures your blood pressure from your wrist or finger. Your doctor can suggest a monitor. How to prepare Avoid these things for 30 minutes before checking your blood pressure:  Drinking caffeine.  Drinking alcohol.  Eating.  Smoking.  Exercising. Five minutes before checking your blood pressure:  Pee.  Sit in a dining chair. Avoid sitting in a soft couch or armchair.  Be quiet. Do not talk. How to take your blood pressure Follow the instructions that came with your machine. If you have a digital blood pressure monitor, these may be the instructions: 1. Sit up straight. 2. Place your feet on the floor. Do not cross your ankles or legs. 3. Rest your left arm at the level of your heart. You may rest it on a table, desk, or chair. 4. Pull up your shirt sleeve. 5. Wrap the blood pressure cuff around the upper part of your left arm. The cuff should be 1 inch (2.5 cm) above your elbow. It is best to wrap the cuff around bare skin. 6. Fit the cuff snugly around your arm. You should be able to place only one finger between the cuff and your arm. 7. Put the cord inside the groove of your elbow. 8. Press the power button. 9. Sit quietly while the cuff fills with air and loses air. 10. Write down the numbers on the screen. 11. Wait 2-3 minutes and then repeat steps 1-10. What do the numbers mean? Two  numbers make up your blood pressure. The first number is called systolic pressure. The second is called diastolic pressure. An example of a blood pressure reading is "120 over 80" (or 120/80). If you are an adult and do not have a medical condition, use this guide to find out if your blood pressure is normal: Normal  First number: below 120.  Second number: below 80. Elevated  First number: 120-129.  Second number: below 80. Hypertension stage 1  First number: 130-139.  Second number: 80-89. Hypertension stage 2  First number: 140 or above.  Second number: 90 or above. Your blood pressure is above normal even if only the top or bottom number is above normal. Follow these instructions at home:  Check your blood pressure as often as your doctor tells you to.  Take your monitor to your next doctor's appointment. Your doctor will: ? Make sure you are using it correctly. ? Make sure it is working right.  Make sure you understand what your blood pressure numbers should be.  Tell your doctor if your medicines are causing side effects. Contact a doctor if:  Your blood pressure keeps being high. Get help right away if:  Your first blood pressure number is higher than 180.  Your second blood pressure number is higher than 120. This information is not intended to replace advice given to you by your health   care provider. Make sure you discuss any questions you have with your health care provider. Document Revised: 03/30/2017 Document Reviewed: 09/24/2015 Elsevier Patient Education  2020 [employer].

## 2020-02-02 ENCOUNTER — Encounter (INDEPENDENT_AMBULATORY_CARE_PROVIDER_SITE_OTHER): Payer: Medicare Other | Admitting: Internal Medicine

## 2020-02-02 DIAGNOSIS — G4733 Obstructive sleep apnea (adult) (pediatric): Secondary | ICD-10-CM | POA: Diagnosis not present

## 2020-02-02 NOTE — Procedures (Signed)
[facility] Report Part I  Phone: [phone] Fax: [phone]  Patient Name: Paige [name], Paige [name]. Acquisition Number: [id number]  Date of Birth: 11-04-1945 Acquisition Date: 01/28/2020  Referring Physician: Devona Konig, MD     History: The patient is a 74 year old female with obstructive sleep apnea for CPAP titration. Medical History: OSA, hyperlipidemia, hypertension, hypothyroidism.  Medications: aspirin, multivitamin, cyanocobalamin, Flonase, Synthroid, Cozaar, Mobic, Fish oil, Crestor, Desyrel.  Procedure: This routine overnight polysomnogram was performed on the [facility] 5 using the standard CPAP protocol. This included 6 channels of EEG, 2 channels of EOG, chin EMG, bilateral anterior tibialis EMG, nasal/oral thermistor, PTAF (nasal pressure transducer), chest and abdominal wall movements, EKG, and pulse oximetry.  Description: The total recording time was 404.7 minutes. The total sleep time was 238.0 minutes. There were a total of 152.0 minutes of wakefulness after sleep onset for a poor?sleep efficiency of 58.8%. The latency to sleep onset was within normal limits?at 14.7 minutes. The R sleep onset latency was within normal limits at 106.0 minutes.?? Sleep parameters, as a percentage of the total sleep time, demonstrated 9.5% of sleep was in N1 sleep, 59.2% N2, 18.3% N3 and 13.0% R sleep. There were a total of 66 arousals for an arousal index of 16.6 arousals per hour of sleep that was elevated.  Overall, there were a total of 13 respiratory events for a respiratory disturbance index, which includes apneas, hypopneas and RERAs (increased respiratory effort) of 3.3 respiratory events per hour of sleep during the pressure titration. CPAP was initiated at 4 cm H2O at lights out, 11:38 p.m. It was titrated in 1 cm increments for occasional hypopneas to 6 cm H2O. The apnea was well controlled at this pressure and supine, REM sleep was observed. The pressure was  increased to the final pressure of 7 cm H2O at 6:00 Mcdanielm.   Additionally, the baseline oxygen saturation during wakefulness was 98%, during NREM sleep averaged 97%, and during REM sleep averaged 97%. The total duration of oxygen < 90% was 0.2 minutes.  Cardiac monitoring- There were no significant cardiac rhythm irregularities.   Periodic limb movement monitoring- demonstrated that there were 174 periodic limb movements for a periodic limb movement index of 43.9 periodic limb movements per hour of sleep. Quasi-periodic limb movements were observed during periods of wakefulness.  Impression: This patient's obstructive sleep apnea demonstrated significant improvement with the utilization of nasal CPAP at 6 cm H2O.   There was a significantly elevated periodic limb movement index of 43.9 periodic limb movements per hour of sleep. In addition, quasi-periodic limb movements were observed during periods of wakefulness. These limb movements were observed during the initial polysomnogram. Treatment may be indicated if sleep disruption or sleepiness persist once the patient is fully compliant with CPAP.  Recommendations: 1. CPAP titration study is adequate to control this patients sleep apnea. The optimal pressure appears to be 6cm H2O. 2. Nasal Decongestants and antihistamines may be of help for increased upper airway resistance. 3. Weight loss through dietary and lifestyle modifications to include exercise is recommended in the presence of obesity. 4. Alternative treatment options if the patient is not willing or is unable to use CPAP include oral appliances as well as surgical intervention in the right clinical setting. 5. Clinical correlation is recommended. Please feel free to call the office for any further questions or assistance in the care of this patient.  6. An Airfit F20 mask, size medium, was used. Chin strap  used during study- no. Humidifier used during study- yes.      Allyne Gee, MD  [facility] Diplomate ABMS Pulmonary Critical Care Medicine Sleep Medicine Electronically reviewed and digitally signed   [facility] CPAP/BIPAP Polysomnogram Report Part II Phone: [phone] Fax: [phone]  Patient last name Zercher-Nylin Neck Size 14.0 in. Acquisition [id number]  Patient first name Paige A. Weight 178.0 lbs. Started 01/28/2020 at 11:21:19 PM  Birth date 08-16-1945 Height 65.0 in. Stopped 01/29/2020 at 6:24:01 AM  Age 46      Type Adult BMI 29.6 lb/in2 Duration 404.7  Report generated by: Herschel Senegal. Wadie Lessen, RPSGT Sleep Data: Lights Out: 11:38:07 PM Sleep Onset: 11:52:49 PM  Lights On: 6:22:49 AM Sleep Efficiency: 58.8 %  Total Recording Time: 404.7 min Sleep Latency (from Lights Off) 14.7 min  Total Sleep Time (TST): 238.0 min R Latency (from Sleep Onset): 106.0 min  Sleep Period Time: 385.5 min Total number of awakenings: 50  Wake during sleep: 147.5 min Wake After Sleep Onset (WASO): 152.0 min   Sleep Data:         Arousal Summary: Stage  Latency from lights out (min) Latency from sleep onset (min) Duration (min) % Total Sleep Time  Normal values  N 1 14.7 0.0 22.5 9.5 (5%)  N 2 15.7 1.0 141.0 59.2 (50%)  N 3 46.7 32.0 43.5 18.3 (20%)  R 120.7 106.0 31.0 13.0 (25%)    Number Index  Spontaneous 53 13.4  Apneas & Hypopneas 6 1.5  RERAs 1 0.3       (Apneas & Hypopneas & RERAs)  (7) (1.8)  Limb Movement 24 6.1  Snore 0 0.0  TOTAL 84 21.2      Respiratory Data:  CA OA MA Apnea Hypopnea* A+ H RERA Total  Number 0 0 0 0 12 12 1 13   Mean Dur (sec) 0.0 0.0 0.0 0.0 31.9 31.9 21.5 31.1  Max Dur (sec) 0.0 0.0 0.0 0.0 56.0 56.0 21.5 56.0  Total Dur (min) 0.0 0.0 0.0 0.0 6.4 6.4 0.4 6.7  % of TST 0.0 0.0 0.0 0.0 2.7 2.7 0.2 2.8  Index (#/h TST) 0.0 0.0 0.0 0.0 3.0 3.0 0.3 3.3  *Hypopneas scored based on 4% or greater desaturation.  Sleep Stage:         REM NREM TST  AHI 0.0 3.5 3.0  RDI 0.0 3.8 3.3    Sleep (min) TST (%) REM (min)  NREM (min) CA (#) OA (#) MA (#) HYP (#) AHI (#/h) RERA (#) RDI (#/h) Desat (#)  Supine 101.0 42.44 10.0 91.0 0 0 0 3 1.8 0 1.8 18  Non-Supine 137.00 57.56 21.00 116.00 0.00 0.00 0.00 9.00 3.94 1.00 4.38 23.00  Left: 133.5 56.09 21.0 112.5 0 0 0 9 4.0 1 4.5 23  Prone: 3.5 1.47 0.0 3.5 0 0 0 0 0.0 0 0.00 0  Right: 0.0 0.00 0.0 0.0 0 0 0 0 0.0 0 0.00 0  UP: 0.0 0.00 0.0 0.0 0 0 0 0 0.0 0 0.00 0     Snoring: Total number of snoring episodes  0  Total time with snoring    min (   % of sleep)   Oximetry Distribution:             WK REM NREM TOTAL  Average (%)   98 97 97 97  < 90% 0.2 0.0 0.0 0.2  < 80% 0.0 0.0 0.0 0.0  < 70% 0.0 0.0 0.0  0.0  # of Desaturations* 9 2 28  39  Desat Index (#/hour) 3.4 3.9 8.1 9.8  Desat Max (%) 13 5 15 15   Desat Max Dur (sec) 45.0 56.5 118.0 118.0  Approx Min O2 during sleep 83  Approx min O2 during a respiratory event 89  Was Oxygen added (Y/N) and final rate No:   0 LPM  *Desaturations based on 3% or greater drop from baseline.   Cheyne Stokes Breathing: None Present    Heart Rate Summary:  Average Heart Rate During Sleep 53.0 bpm      Highest Heart Rate During Sleep (95th %) 58.0 bpm      Highest Heart Rate During Sleep 111 bpm      Highest Heart Rate During Recording (TIB) 209 bpm (artifact)   Heart Rate Observations: Event Type # Events   Bradycardia 0 Lowest HR Scored: N/A  Sinus Tachycardia During Sleep 0 Highest HR Scored: N/A  Narrow Complex Tachycardia 0 Highest HR Scored: N/A  Wide Complex Tachycardia 0 Highest HR Scored: N/A  Asystole 0 Longest Pause: N/A  Atrial Fibrillation 0 Duration Longest Event: N/A  Other Arrythmias  No Type:   Periodic Limb Movement Data: (Primary legs unless otherwise noted) Total # Limb Movement 174 Limb Movement Index 43.9  Total # PLMS 174 PLMS Index 43.9  Total # PLMS Arousals 24 PLMS Arousal Index 6.1  Percentage Sleep Time with PLMS 83.19min (35.2 % sleep)  Mean Duration limb movements (secs)  717.6   IPAP Level (cmH2O) EPAP Level (cmH2O) Total Duration (min) Sleep Duration (min) Sleep (%) REM (%) CA  #) OA # MA # HYP #) AHI (#/hr) RERAs # RERAs (#/hr) RDI (#/hr)  4 4 87.2 64.0 73.4 0.0 0 0 0 7 6.6 1 0.9 7.5  5 5  180.7 78.2 43.3 11.6 0 0 0 4 3.1 0 0.0 3.1  6 6  93.6 83.6 89.3 10.7 0 0 0 1 0.7 0 0.0 0.7  7 7  17.6 11.6 65.9 0.0 0 0 0 0 0.0 0 0.0 0.0

## 2020-02-03 NOTE — Progress Notes (Signed)
OSA sleep study and CPAP titration results:  Recommendations: CPAP titration study is adequate to control this patients sleep apnea. The optimal pressure appears to be 6cm H2O. Nasal Decongestants and antihistamines may be of help for increased upper airway resistance. Weight loss through dietary and lifestyle modifications to include exercise is recommended in the presence of obesity. Alternative treatment options if the patient is not willing or is unable to use CPAP include oral appliances as well as surgical intervention in the right clinical setting. Clinical correlation is recommended. Please feel free to call the office for any further questions or assistance in the care of this patient.  An Airfit F20 mask, size medium, was used. Chin strap used during study- no. Humidifier used during study- yes.  Please see if you can find the f/up CPAP orders - pressure 6 I don't know if we need to do the CPAP orders of if Dr. Humphrey Rolls or pulm/sleep medicine providers do it with [facility] -  Please call pt with results - give #2 recommendation I suggest doing a f/up OV after a few months of CPAP to f/up and recheck leg movement - possibly restless leg syndrome

## 2020-02-04 NOTE — Progress Notes (Signed)
Name: Paige [name]   MRN: [id number]    DOB: 07/05/1945   Date:02/05/2020       Progress Note  Subjective  Chief Complaint  Chief Complaint  Patient presents with  . Hyperlipidemia  . Hypertension    elevated bp  . Hypothyroidism    HPI  Hyperlipidemia: she is now on Rosuvastatin in place of Pravastatin , she denies myalgia   HTN: She had Losartan cut in half at last visit due to dizziness, and has not had any symptoms since dose change, she has been taking half of losartan 12.5 mg , bp today is 134, she states bp spiked to 180 when she went to Dr. Laurelyn Sickle office but bp has been back to 130's since. .She denies chest pain or palpitation   Hypothyroidism: she has been on levothyroxine for 88 mcg for many years. TSH at goal  OSA : diagnosed by Dr. Humphrey Rolls recently and will go back in October to discuss titration study, she has also joined weight watchers and has lost 6 lbs  Right achilles tendinitis: seen by Dr.Hyatt back in May 2021 and was diagnosed with heel spur and also tendinitis, she had PT , taking meloxicam prn, changed shoe wear and is doing better but still has some swelling and pain, also noticed some pain on right outer hip over the past month.   Left sciatic notch pain: she states that over the past few months she has noticed a catch on left buttocks that radiates up to her lower back, pulling sensation, she states feels more like stiffness than pain at times. She states she never felt stiff before and is bothering her . She states occasionally radiates to left hamstring, no burning or tingling , no bowel or bladder incontinence.    Patient Active Problem List   Diagnosis Date Noted  . Essential hypertension 08/28/2018  . Osteopenia 08/28/2018  . Hyperlipidemia 10/27/2014  . Allergic rhinitis 10/27/2014  . Adult hypothyroidism 10/13/2014    No past surgical history on file.  Family History  Problem Relation Age of Onset  . Cancer Mother   . Diabetes Father    . Stroke Father   . Breast cancer Neg Hx     Social History   Tobacco Use  . Smoking status: Former Smoker    Packs/day: 0.25    Years: 5.00    Pack years: 1.25    Types: Cigarettes    Start date: 05/01/1972    Quit date: 05/01/1977    Years since quitting: 42.7  . Smokeless tobacco: Never Used  Substance Use Topics  . Alcohol use: No     Current Outpatient Medications:  .  aspirin 81 MG tablet, Take 1 tablet by mouth daily., Disp: , Rfl:  .  Calcium-Phosphorus-Vitamin D 100-50-100 MG-MG-UNIT CHEW, Chew 1 tablet by mouth., Disp: , Rfl:  .  co-enzyme Q-10 30 MG capsule, Take 30 mg by mouth 3 (three) times daily., Disp: , Rfl:  .  cyanocobalamin 100 MCG tablet, Take 1,000 mcg by mouth daily. , Disp: , Rfl:  .  fluticasone (FLONASE) 50 MCG/ACT nasal spray, Place 1 spray into both nostrils daily. (Patient taking differently: Place 1 spray into both nostrils daily. rarely), Disp: 16 g, Rfl: 3 .  glucosamine-chondroitin 500-400 MG tablet, Take 1 tablet by mouth 3 (three) times daily., Disp: , Rfl:  .  levothyroxine (SYNTHROID) 88 MCG tablet, TAKE 1 TABLET DAILY BEFORE BREAKFAST, Disp: 90 tablet, Rfl: 2 .  losartan (COZAAR) 25 MG  tablet, TAKE 1/2 TABLET (12.5MG     TOTAL) DAILY, Disp: 90 tablet, Rfl: 0 .  meloxicam (MOBIC) 7.5 MG tablet, Take 1 tablet (7.5 mg total) by mouth daily., Disp: 30 tablet, Rfl: 1 .  MULTIPLE VITAMIN PO, Take by mouth daily., Disp: , Rfl:  .  Multiple Vitamins-Minerals (HAIR SKIN AND NAILS FORMULA PO), Take by mouth. , Disp: , Rfl:  .  Omega-3 Fatty Acids (FISH OIL BURP-LESS) 1200 MG CAPS, Take 1 capsule by mouth., Disp: , Rfl:  .  rosuvastatin (CRESTOR) 20 MG tablet, TAKE 1 TABLET DAILY, Disp: 90 tablet, Rfl: 2 .  Turmeric 500 MG TABS, Take 1 tablet by mouth daily., Disp: , Rfl:   Allergies  Allergen Reactions  . Codeine Nausea And Vomiting    I personally reviewed active problem list, medication list, allergies, family history, social history, health  maintenance with the patient/caregiver today.   ROS  Constitutional: Negative for fever or weight change.  Respiratory: Negative for cough and shortness of breath.   Cardiovascular: Negative for chest pain or palpitations.  Gastrointestinal: Negative for abdominal pain, no bowel changes.  Musculoskeletal: Positive  for gait problem but no  joint swelling.  Skin: Negative for rash.  Neurological: Negative for dizziness or headache.  No other specific complaints in a complete review of systems (except as listed in HPI above).  Objective  Vitals:   02/05/20 0742  BP: 132/84  Pulse: 70  Resp: 16  Temp: 98.6 F (37 C)  TempSrc: Oral  SpO2: 98%  Weight: 174 lb 6.4 oz (79.1 kg)  Height: 5\' 9"  (1.753 m)    Body mass index is 25.75 kg/m.  Physical Exam  Constitutional: Patient appears well-developed and well-nourished. No distress.  HEENT: head atraumatic, normocephalic, pupils equal and reactive to light, neck supple, throat within normal limits Cardiovascular: Normal rate, regular rhythm and normal heart sounds.  No murmur heard. No BLE edema. Pulmonary/Chest: Effort normal and breath sounds normal. No respiratory distress. Abdominal: Soft.  There is no tenderness. Psychiatric: Patient has a normal mood and affect. behavior is normal. Judgment and thought content normal. Muscular skeletal: mild swelling on left heel, no redness. , pain during palpation of right outer hip, normal rom of lumbar spine, negative straight leg raise , pain during palpation of lumbar spine and para-spinal muscles  PHQ2/9: Depression screen [facility] 2/9 02/05/2020 01/23/2020 12/03/2019 08/27/2019 08/06/2019  Decreased Interest 0 0 0 0 0  Down, Depressed, Hopeless 1 0 0 0 0  PHQ - 2 Score 1 0 0 0 0  Altered sleeping - - 0 0 0  Tired, decreased energy - - 0 0 0  Change in appetite - - 0 0 0  Feeling bad or failure about yourself  - - 0 0 0  Trouble concentrating - - 0 0 0  Moving slowly or fidgety/restless - -  0 0 0  Suicidal thoughts - - 0 0 0  PHQ-9 Score - - 0 0 0  Difficult doing work/chores - - Not difficult at all - -    phq 9 is negative   Fall Risk: Fall Risk  02/05/2020 01/23/2020 12/03/2019 08/27/2019 08/06/2019  Falls in the past year? 0 0 0 0 0  Comment - - - - -  Number falls in past yr: 0 0 0 0 0  Comment - - - - -  Injury with Fall? 0 0 0 0 0  Follow up Falls evaluation completed Falls evaluation completed Falls evaluation completed - -  Functional Status Survey: Is the patient deaf or have difficulty hearing?: No Does the patient have difficulty seeing, even when wearing glasses/contacts?: No Does the patient have difficulty concentrating, remembering, or making decisions?: No Does the patient have difficulty walking or climbing stairs?: No Does the patient have difficulty dressing or bathing?: No Does the patient have difficulty doing errands alone such as visiting a doctor's office or shopping?: No    Assessment & Plan   1. Essential hypertension  At goal   2. Pure hypercholesterolemia  - Lipid panel - Hepatic function panel  3. Adult hypothyroidism  Continue current dose  4. OSA (obstructive sleep apnea)  Seeing Dr. Humphrey Rolls   5. Right Achilles tendinitis  - meloxicam (MOBIC) 7.5 MG tablet; Take 1 tablet (7.5 mg total) by mouth daily.  Dispense: 90 tablet; Refill: 0  6. Trochanteric bursitis, right hip  She wants to try home PT for now   7. Low back pain radiating to left lower extremity  Discussed PT and we will try low dose gabapentin at night

## 2020-02-04 NOTE — Progress Notes (Signed)
Awesome thanks!

## 2020-02-05 ENCOUNTER — Other Ambulatory Visit: Payer: Self-pay

## 2020-02-05 ENCOUNTER — Ambulatory Visit (INDEPENDENT_AMBULATORY_CARE_PROVIDER_SITE_OTHER): Payer: Medicare Other | Admitting: Family Medicine

## 2020-02-05 ENCOUNTER — Encounter: Payer: Self-pay | Admitting: Family Medicine

## 2020-02-05 VITALS — BP 132/84 | HR 70 | Temp 98.6°F | Resp 16 | Ht 69.0 in | Wt 174.4 lb

## 2020-02-05 DIAGNOSIS — M7061 Trochanteric bursitis, right hip: Secondary | ICD-10-CM | POA: Diagnosis not present

## 2020-02-05 DIAGNOSIS — M79605 Pain in left leg: Secondary | ICD-10-CM | POA: Diagnosis not present

## 2020-02-05 DIAGNOSIS — E039 Hypothyroidism, unspecified: Secondary | ICD-10-CM

## 2020-02-05 DIAGNOSIS — M545 Low back pain, unspecified: Secondary | ICD-10-CM | POA: Diagnosis not present

## 2020-02-05 DIAGNOSIS — G4733 Obstructive sleep apnea (adult) (pediatric): Secondary | ICD-10-CM

## 2020-02-05 DIAGNOSIS — E7800 Pure hypercholesterolemia, unspecified: Secondary | ICD-10-CM

## 2020-02-05 DIAGNOSIS — I10 Essential (primary) hypertension: Secondary | ICD-10-CM | POA: Diagnosis not present

## 2020-02-05 DIAGNOSIS — M7661 Achilles tendinitis, right leg: Secondary | ICD-10-CM | POA: Diagnosis not present

## 2020-02-05 MED ORDER — GABAPENTIN 100 MG PO CAPS: 100.0000 mg | ORAL_CAPSULE | Freq: Every day | ORAL | 0 refills | Status: DC

## 2020-02-05 MED ORDER — MELOXICAM 7.5 MG PO TABS: 7.5000 mg | ORAL_TABLET | Freq: Every day | ORAL | 0 refills | Status: DC

## 2020-02-05 NOTE — Patient Instructions (Signed)
Hip Bursitis Rehab Ask your health care provider which exercises are safe for you. Do exercises exactly as told by your health care provider and adjust them as directed. It is normal to feel mild stretching, pulling, tightness, or discomfort as you do these exercises. Stop right away if you feel sudden pain or your pain gets worse. Do not begin these exercises until told by your health care provider. Stretching exercise This exercise warms up your muscles and joints and improves the movement and flexibility of your hip. This exercise also helps to relieve pain and stiffness. Iliotibial band stretch An iliotibial band is a strong band of muscle tissue that runs from the outer side of your hip to the outer side of your thigh and knee. 1. Lie on your side with your left / right leg in the top position. 2. Bend your left / right knee and grab your ankle. Stretch out your bottom arm to help you balance. 3. Slowly bring your knee back so your thigh is behind your body. 4. Slowly lower your knee toward the floor until you feel a gentle stretch on the outside of your left / right thigh. If you do not feel a stretch and your knee will not fall farther, place the heel of your other foot on top of your knee and pull your knee down toward the floor with your foot. 5. Hold this position for __________ seconds. 6. Slowly return to the starting position. Repeat __________ times. Complete this exercise __________ times a day. Strengthening exercises These exercises build strength and endurance in your hip and pelvis. Endurance is the ability to use your muscles for a long time, even after they get tired. Bridge This exercise strengthens the muscles that move your thigh backward (hip extensors). 1. Lie on your back on a firm surface with your knees bent and your feet flat on the floor. 2. Tighten your buttocks muscles and lift your buttocks off the floor until your trunk is level with your thighs. ? Do not arch  your back. ? You should feel the muscles working in your buttocks and the back of your thighs. If you do not feel these muscles, slide your feet 1-2 inches (2.5-5 cm) farther away from your buttocks. ? If this exercise is too easy, try doing it with your arms crossed over your chest. 3. Hold this position for __________ seconds. 4. Slowly lower your hips to the starting position. 5. Let your muscles relax completely after each repetition. Repeat __________ times. Complete this exercise __________ times a day. Squats This exercise strengthens the muscles in front of your thigh and knee (quadriceps). 1. Stand in front of a table, with your feet and knees pointing straight ahead. You may rest your hands on the table for balance but not for support. 2. Slowly bend your knees and lower your hips like you are going to sit in a chair. ? Keep your weight over your heels, not over your toes. ? Keep your lower legs upright so they are parallel with the table legs. ? Do not let your hips go lower than your knees. ? Do not bend lower than told by your health care provider. ? If your hip pain increases, do not bend as low. 3. Hold the squat position for __________ seconds. 4. Slowly push with your legs to return to standing. Do not use your hands to pull yourself to standing. Repeat __________ times. Complete this exercise __________ times a day. Hip hike 1. Stand   sideways on a bottom step. Stand on your left / right leg with your other foot unsupported next to the step. You can hold on to the railing or wall for balance if needed. 2. Keep your knees straight and your torso square. Then lift your left / right hip up toward the ceiling. 3. Hold this position for __________ seconds. 4. Slowly let your left / right hip lower toward the floor, past the starting position. Your foot should get closer to the floor. Do not lean or bend your knees. Repeat __________ times. Complete this exercise __________ times a  day. Single leg stand 1. Without shoes, stand near a railing or in a doorway. You may hold on to the railing or door frame as needed for balance. 2. Squeeze your left / right buttock muscles, then lift up your other foot. ? Do not let your left / right hip push out to the side. ? It is helpful to stand in front of a mirror for this exercise so you can watch your hip. 3. Hold this position for __________ seconds. Repeat __________ times. Complete this exercise __________ times a day. This information is not intended to replace advice given to you by your health care provider. Make sure you discuss any questions you have with your health care provider. Document Revised: 08/12/2018 Document Reviewed: 08/12/2018 Elsevier Patient Education  2020 [employer]vier Inc.  

## 2020-02-06 LAB — HEPATIC FUNCTION PANEL
AG Ratio: 1.6 (calc) (ref 1.0–2.5)
ALT: 15 U/L (ref 6–29)
AST: 19 U/L (ref 10–35)
Albumin: 4.4 g/dL (ref 3.6–5.1)
Alkaline phosphatase (APISO): 45 U/L (ref 37–153)
Bilirubin, Direct: 0.2 mg/dL (ref 0.0–0.2)
Globulin: 2.8 g/dL (calc) (ref 1.9–3.7)
Indirect Bilirubin: 0.5 mg/dL (calc) (ref 0.2–1.2)
Total Bilirubin: 0.7 mg/dL (ref 0.2–1.2)
Total Protein: 7.2 g/dL (ref 6.1–8.1)

## 2020-02-06 LAB — LIPID PANEL
Cholesterol: 133 mg/dL (ref <200)
HDL: 47 mg/dL — ABNORMAL LOW (ref >=50)
LDL Cholesterol (Calc): 70 mg/dL (calc)
Non-HDL Cholesterol (Calc): 86 mg/dL (calc) (ref <130)
Total CHOL/HDL Ratio: 2.8 (calc) (ref <5.0)
Triglycerides: 79 mg/dL (ref <150)

## 2020-02-26 ENCOUNTER — Telehealth: Payer: Self-pay

## 2020-02-26 ENCOUNTER — Encounter: Payer: Self-pay | Admitting: Internal Medicine

## 2020-02-26 ENCOUNTER — Other Ambulatory Visit: Payer: Self-pay

## 2020-02-26 ENCOUNTER — Ambulatory Visit (INDEPENDENT_AMBULATORY_CARE_PROVIDER_SITE_OTHER): Payer: Medicare Other | Admitting: Internal Medicine

## 2020-02-26 DIAGNOSIS — G4761 Periodic limb movement disorder: Secondary | ICD-10-CM | POA: Diagnosis not present

## 2020-02-26 DIAGNOSIS — G4733 Obstructive sleep apnea (adult) (pediatric): Secondary | ICD-10-CM | POA: Diagnosis not present

## 2020-02-26 NOTE — Progress Notes (Signed)
[facility] [place], [place] [address]  Pulmonary Sleep Medicine   Office Visit Note  Patient Name: Paige [name] DOB: 1945-09-09 MRN [id number]  Date of Service: 03/01/2020  Complaints/HPI: Patient is here for routine pulmonary follow-up Reviewed her recent sleep study as well as CPAP titration study Recommend nasal CPAP at 6--significant PLM as well She does not report restless or crawling sensations in her legs in the evenings or while she is sleeping  ROS  General: (-) fever, (-) chills, (-) night sweats, (-) weakness Skin: (-) rashes, (-) itching,. Eyes: (-) visual changes, (-) redness, (-) itching. Nose and Sinuses: (-) nasal stuffiness or itchiness, (-) postnasal drip, (-) nosebleeds, (-) sinus trouble. Mouth and Throat: (-) sore throat, (-) hoarseness. Neck: (-) swollen glands, (-) enlarged thyroid, (-) neck pain. Respiratory: - cough, (-) bloody sputum, - shortness of breath, - wheezing. Cardiovascular: - ankle swelling, (-) chest pain. Lymphatic: (-) lymph node enlargement. Neurologic: (-) numbness, (-) tingling. Psychiatric: (-) anxiety, (-) depression   Current Medication: Outpatient Encounter Medications as of 02/26/2020  Medication Sig  . aspirin 81 MG tablet Take 1 tablet by mouth daily.  . Calcium-Phosphorus-Vitamin D 100-50-100 MG-MG-UNIT CHEW Chew 1 tablet by mouth.  . co-enzyme Q-10 30 MG capsule Take 30 mg by mouth 3 (three) times daily.  . cyanocobalamin 100 MCG tablet Take 1,000 mcg by mouth daily.   . fluticasone (FLONASE) 50 MCG/ACT nasal spray Place 1 spray into both nostrils daily. (Patient taking differently: Place 1 spray into both nostrils daily. rarely)  . gabapentin (NEURONTIN) 100 MG capsule Take 1-2 capsules (100-200 mg total) by mouth at bedtime.  Marland Kitchen glucosamine-chondroitin 500-400 MG tablet Take 1 tablet by mouth 3 (three) times daily.  Marland Kitchen levothyroxine (SYNTHROID) 88 MCG tablet TAKE 1 TABLET DAILY BEFORE  BREAKFAST  . losartan (COZAAR) 25 MG tablet TAKE 1/2 TABLET (12.5MG     TOTAL) DAILY  . meloxicam (MOBIC) 7.5 MG tablet Take 1 tablet (7.5 mg total) by mouth daily.  . MULTIPLE VITAMIN PO Take by mouth daily.  . Multiple Vitamins-Minerals (HAIR SKIN AND NAILS FORMULA PO) Take by mouth.   . Omega-3 Fatty Acids (FISH OIL BURP-LESS) 1200 MG CAPS Take 1 capsule by mouth.  . rosuvastatin (CRESTOR) 20 MG tablet TAKE 1 TABLET DAILY  . Turmeric 500 MG TABS Take 1 tablet by mouth daily.   No facility-administered encounter medications on file as of 02/26/2020.    Surgical History: History reviewed. No pertinent surgical history.  Medical History: Past Medical History:  Diagnosis Date  . Hyperlipidemia   . Hypertension   . Thyroid disease     Family History: Family History  Problem Relation Age of Onset  . Cancer Mother   . Diabetes Father   . Stroke Father   . Breast cancer Neg Hx     Social History: Social History   Socioeconomic History  . Marital status: Married    Spouse name: Not on file  . Number of children: 0  . Years of education: Not on file  . Highest education level: High school graduate  Occupational History  . Occupation: Retired  . Occupation: Armed forces operational officer  Tobacco Use  . Smoking status: Former Smoker    Packs/day: 0.25    Years: 5.00    Pack years: 1.25    Types: Cigarettes    Start date: 05/01/1972    Quit date: 05/01/1977    Years since quitting: 42.8  . Smokeless tobacco: Never Used  Vaping Use  . Vaping Use: Never used  Substance and Sexual Activity  . Alcohol use: No  . Drug use: No  . Sexual activity: Yes    Partners: Male  Other Topics Concern  . Not on file  Social History Narrative   Second marriage, widow from first marriage at age 66 and her current husband had two teenagers who she help raise    Social Determinants of Health   Financial Resource Strain: Low Risk   . Difficulty of Paying Living Expenses: Not hard at all  Food  Insecurity: No Food Insecurity  . Worried About Charity fundraiser in the Last Year: Never true  . Ran Out of Food in the Last Year: Never true  Transportation Needs: No Transportation Needs  . Lack of Transportation (Medical): No  . Lack of Transportation (Non-Medical): No  Physical Activity: Sufficiently Active  . Days of Exercise per Week: 7 days  . Minutes of Exercise per Session: 60 min  Stress: No Stress Concern Present  . Feeling of Stress : Not at all  Social Connections: Socially Integrated  . Frequency of Communication with Friends and Family: More than three times a week  . Frequency of Social Gatherings with Friends and Family: More than three times a week  . Attends Religious Services: More than 4 times per year  . Active Member of Clubs or Organizations: Yes  . Attends Archivist Meetings: More than 4 times per year  . Marital Status: Married  Human resources officer Violence: Not At Risk  . Fear of Current or Ex-Partner: No  . Emotionally Abused: No  . Physically Abused: No  . Sexually Abused: No    Vital Signs: Blood pressure (!) 148/78, pulse 88, temperature 97.8 F (36.6 C), resp. rate 16, height 5\' 6"  (1.676 m), weight 172 lb (78 kg), SpO2 99 %.  Examination: General Appearance: The patient is well-developed, well-nourished, and in no distress. Skin: Gross inspection of skin unremarkable. Head: normocephalic, no gross deformities. Eyes: no gross deformities noted. ENT: ears appear grossly normal no exudates. Neck: Supple. No thyromegaly. No LAD. Respiratory: Clear throughout, no rhonchi, wheeze or rales noted. Cardiovascular: Normal S1 and S2 without murmur or rub. Extremities: No cyanosis. pulses are equal. Neurologic: Alert and oriented. No involuntary movements.  LABS: Recent Results (from the past 2160 hour(s))  Lipid panel     Status: Abnormal   Collection Time: 02/05/20  8:49 AM  Result Value Ref Range   Cholesterol 133 <200 mg/dL   HDL 47  (L) > OR = 50 mg/dL   Triglycerides 79 <150 mg/dL   LDL Cholesterol (Calc) 70 mg/dL (calc)    Comment: Reference range: <100 . Desirable range <100 mg/dL for primary prevention;   <70 mg/dL for patients with CHD or diabetic patients  with > or = 2 CHD risk factors. Marland Kitchen LDL-C is now calculated using the Martin-Hopkins  calculation, which is a validated novel method providing  better accuracy than the Friedewald equation in the  estimation of LDL-C.  Cresenciano Genre et al. Annamaria Helling. 1093;235(57): 2061-2068  (http://education.[facility].com/faq/FAQ164)    Total CHOL/HDL Ratio 2.8 <5.0 (calc)   Non-HDL Cholesterol (Calc) 86 <130 mg/dL (calc)    Comment: For patients with diabetes plus 1 major ASCVD risk  factor, treating to a non-HDL-C goal of <100 mg/dL  (LDL-C of <70 mg/dL) is considered a therapeutic  option.   Hepatic function panel     Status: None   Collection Time: 02/05/20  8:49 AM  Result Value Ref Range   Total Protein 7.2 6.1 - 8.1 g/dL   Albumin 4.4 3.6 - 5.1 g/dL   Globulin 2.8 1.9 - 3.7 g/dL (calc)   AG Ratio 1.6 1.0 - 2.5 (calc)   Total Bilirubin 0.7 0.2 - 1.2 mg/dL   Bilirubin, Direct 0.2 0.0 - 0.2 mg/dL   Indirect Bilirubin 0.5 0.2 - 1.2 mg/dL (calc)   Alkaline phosphatase (APISO) 45 37 - 153 U/L   AST 19 10 - 35 U/L   ALT 15 6 - 29 U/L    Radiology: MM 3D SCREEN BREAST BILATERAL  Result Date: 09/01/2019 CLINICAL DATA:  Screening. EXAM: DIGITAL SCREENING BILATERAL MAMMOGRAM WITH TOMO AND CAD COMPARISON:  Previous exam(s). ACR Breast Density Category b: There are scattered areas of fibroglandular density. FINDINGS: There are no findings suspicious for malignancy. Images were processed with CAD. IMPRESSION: No mammographic evidence of malignancy. A result letter of this screening mammogram will be mailed directly to the patient. RECOMMENDATION: Screening mammogram in one year. (Code:SM-B-01Y) BI-RADS CATEGORY  1: Negative. Electronically Signed   By: Nolon Nations M.D.    On: 09/01/2019 12:46    No results found.  No results found.    Assessment and Plan: Patient Active Problem List   Diagnosis Date Noted  . OSA (obstructive sleep apnea) 02/05/2020  . Essential hypertension 08/28/2018  . Osteopenia 08/28/2018  . Hyperlipidemia 10/27/2014  . Allergic rhinitis 10/27/2014  . Adult hypothyroidism 10/13/2014    1. OSA (obstructive sleep apnea) CPAP pressure 6 ordered for home use Will follow-up about 30 days after set-up to assess compliance and comfort - For home use only DME continuous positive airway pressure (CPAP)  2. PLMD (periodic limb movement disorder) Will need to reassess symptoms after CPAP set up to assess for further sleep disruption caused potentially by limb movement May require treatment at that time--will need to assess iron and ferritin levels initally  General Counseling: I have discussed the findings of the evaluation and examination with Earlie Server.  I have also discussed any further diagnostic evaluation thatmay be needed or ordered today. Jannat verbalizes understanding of the findings of todays visit. We also reviewed her medications today and discussed drug interactions and side effects including but not limited excessive drowsiness and altered mental states. We also discussed that there is always a risk not just to her but also people around her. she has been encouraged to call the office with any questions or concerns that should arise related to todays visit.  Orders Placed This Encounter  Procedures  . For home use only DME continuous positive airway pressure (CPAP)    Titration study recommends pressure setting of 6    Order Specific Question:   Length of Need    Answer:   Lifetime    Order Specific Question:   Patient has OSA or probable OSA    Answer:   Yes    Order Specific Question:   Settings    Answer:   5-10    Order Specific Question:   CPAP supplies needed    Answer:   Mask, headgear, cushions, filters,  heated tubing and water chamber     Time spent: 25  I have personally obtained a history, examined the patient, evaluated laboratory and imaging results, formulated the assessment and plan and placed orders. This patient was seen by Casey Burkitt AGNP-C in Collaboration with Dr. Devona Konig as a part of collaborative care agreement.    Allyne Gee,  MD [facility] Pulmonary and Critical Care Sleep medicine

## 2020-02-26 NOTE — Telephone Encounter (Signed)
Gave [facility] for cpap set up, pt prefer kelly with [facility]. Paige [name]

## 2020-03-01 ENCOUNTER — Encounter: Payer: Self-pay | Admitting: Internal Medicine

## 2020-03-01 DIAGNOSIS — M25551 Pain in right hip: Secondary | ICD-10-CM | POA: Diagnosis not present

## 2020-03-01 DIAGNOSIS — M545 Low back pain, unspecified: Secondary | ICD-10-CM | POA: Diagnosis not present

## 2020-03-01 NOTE — Patient Instructions (Signed)

## 2020-03-04 ENCOUNTER — Other Ambulatory Visit: Payer: Self-pay

## 2020-03-04 ENCOUNTER — Ambulatory Visit (INDEPENDENT_AMBULATORY_CARE_PROVIDER_SITE_OTHER): Payer: Medicare Other

## 2020-03-04 VITALS — BP 142/80 | HR 66 | Temp 98.1°F | Resp 16 | Ht 66.0 in | Wt 169.0 lb

## 2020-03-04 DIAGNOSIS — Z0000 Encounter for general adult medical examination without abnormal findings: Secondary | ICD-10-CM | POA: Diagnosis not present

## 2020-03-04 DIAGNOSIS — M25551 Pain in right hip: Secondary | ICD-10-CM | POA: Diagnosis not present

## 2020-03-04 DIAGNOSIS — M545 Low back pain, unspecified: Secondary | ICD-10-CM | POA: Diagnosis not present

## 2020-03-04 NOTE — Progress Notes (Signed)
Subjective:   Paige [name] is a 74 y.o. female who presents for Medicare Annual (Subsequent) preventive examination.  Review of Systems     Cardiac Risk Factors include: advanced age (>7men, >70 women);hypertension;dyslipidemia     Objective:    Today's Vitals   03/04/20 1342  BP: (!) 142/80  Pulse: 66  Resp: 16  Temp: 98.1 F (36.7 C)  TempSrc: Oral  SpO2: 99%  Weight: 169 lb (76.7 kg)  Height: 5\' 6"  (1.676 m)   Body mass index is 27.28 kg/m.  Advanced Directives 03/04/2020 03/04/2019 01/22/2017 01/15/2017 11/20/2016 09/06/2016 11/10/2015  Does Patient Have a Medical Advance Directive? Yes No No No No No No  Type of Paramedic of [place];Living will - - - - - -  Copy of [place] in Chart? No - copy requested - - - - - -  Would patient like information on creating a medical advance directive? - Yes (MAU/Ambulatory/Procedural Areas - Information given) - - Yes (ED - Information included in AVS) - No - patient declined information    Current Medications (verified) Outpatient Encounter Medications as of 03/04/2020  Medication Sig  . aspirin 81 MG tablet Take 1 tablet by mouth daily.  . Calcium-Phosphorus-Vitamin D 100-50-100 MG-MG-UNIT CHEW Chew 1 tablet by mouth.  . co-enzyme Q-10 30 MG capsule Take 30 mg by mouth 3 (three) times daily.  . cyanocobalamin 100 MCG tablet Take 1,000 mcg by mouth daily.   . fluticasone (FLONASE) 50 MCG/ACT nasal spray Place 1 spray into both nostrils daily. (Patient taking differently: Place 1 spray into both nostrils daily. rarely)  . gabapentin (NEURONTIN) 100 MG capsule Take 1-2 capsules (100-200 mg total) by mouth at bedtime.  Marland Kitchen glucosamine-chondroitin 500-400 MG tablet Take 1 tablet by mouth 3 (three) times daily.  Marland Kitchen levothyroxine (SYNTHROID) 88 MCG tablet TAKE 1 TABLET DAILY BEFORE BREAKFAST  . losartan (COZAAR) 25 MG tablet TAKE 1/2 TABLET (12.5MG     TOTAL) DAILY  . meloxicam (MOBIC) 7.5  MG tablet Take 1 tablet (7.5 mg total) by mouth daily.  . MULTIPLE VITAMIN PO Take by mouth daily.  . Multiple Vitamins-Minerals (HAIR SKIN AND NAILS FORMULA PO) Take by mouth.   . Omega-3 Fatty Acids (FISH OIL BURP-LESS) 1200 MG CAPS Take 1 capsule by mouth.  . rosuvastatin (CRESTOR) 20 MG tablet TAKE 1 TABLET DAILY  . Turmeric 500 MG TABS Take 1 tablet by mouth daily.   No facility-administered encounter medications on file as of 03/04/2020.    Allergies (verified) Codeine   History: Past Medical History:  Diagnosis Date  . Hyperlipidemia   . Hypertension   . Thyroid disease    History reviewed. No pertinent surgical history. Family History  Problem Relation Age of Onset  . Cancer Mother   . Diabetes Father   . Stroke Father   . Breast cancer Neg Hx    Social History   Socioeconomic History  . Marital status: Married    Spouse name: Not on file  . Number of children: 0  . Years of education: Not on file  . Highest education level: High school graduate  Occupational History  . Occupation: Retired  . Occupation: Armed forces operational officer  Tobacco Use  . Smoking status: Former Smoker    Packs/day: 0.25    Years: 5.00    Pack years: 1.25    Types: Cigarettes    Start date: 05/01/1972    Quit date: 05/01/1977    Years since quitting:  42.8  . Smokeless tobacco: Never Used  Vaping Use  . Vaping Use: Never used  Substance and Sexual Activity  . Alcohol use: No  . Drug use: No  . Sexual activity: Yes    Partners: Male  Other Topics Concern  . Not on file  Social History Narrative   Second marriage, widow from first marriage at age 84 and her current husband had two teenagers who she help raise    Social Determinants of Health   Financial Resource Strain: Low Risk   . Difficulty of Paying Living Expenses: Not hard at all  Food Insecurity: No Food Insecurity  . Worried About Charity fundraiser in the Last Year: Never true  . Ran Out of Food in the Last Year: Never true    Transportation Needs: No Transportation Needs  . Lack of Transportation (Medical): No  . Lack of Transportation (Non-Medical): No  Physical Activity: Insufficiently Active  . Days of Exercise per Week: 3 days  . Minutes of Exercise per Session: 40 min  Stress: No Stress Concern Present  . Feeling of Stress : Only a little  Social Connections: Socially Integrated  . Frequency of Communication with Friends and Family: More than three times a week  . Frequency of Social Gatherings with Friends and Family: More than three times a week  . Attends Religious Services: More than 4 times per year  . Active Member of Clubs or Organizations: Yes  . Attends Archivist Meetings: More than 4 times per year  . Marital Status: Married    Tobacco Counseling Counseling given: Not Answered   Clinical Intake:  Pre-visit preparation completed: Yes  Pain : No/denies pain     BMI - recorded: 27.28 Nutritional Status: BMI 25 -29 Overweight Nutritional Risks: None Diabetes: No  How often do you need to have someone help you when you read instructions, pamphlets, or other written materials from your doctor or pharmacy?: 1 - Never    Interpreter Needed?: No  Information entered by :: Clemetine Marker LPN   Activities of Daily Living In your present state of health, do you have any difficulty performing the following activities: 03/04/2020 02/05/2020  Hearing? N N  Comment declines hearing aids -  Vision? N N  Difficulty concentrating or making decisions? N N  Walking or climbing stairs? N N  Dressing or bathing? N N  Doing errands, shopping? N N  Preparing Food and eating ? N -  Using the Toilet? N -  In the past six months, have you accidently leaked urine? Y -  Comment wears pads for protection -  Do you have problems with loss of bowel control? N -  Managing your Medications? N -  Managing your Finances? N -  Housekeeping or managing your Housekeeping? N -  Some recent data  might be hidden    Patient Care Team: Steele Sizer, MD as PCP - General (Family Medicine) Birder Robson, MD as Referring Physician (Ophthalmology) Carloyn Manner, MD as Referring Physician (Otolaryngology) Verdia Kuba, [facility] (Pharmacist)  Indicate any recent Medical Services you may have received from other than [facility] providers in the past year (date may be approximate).     Assessment:   This is a routine wellness examination for Anslee.  Hearing/Vision screen  Hearing Screening   125Hz  250Hz  500Hz  1000Hz  2000Hz  3000Hz  4000Hz  6000Hz  8000Hz   Right ear:           Left ear:  Comments: Pt c/o mild hearing difficulty occasionally when ears are stopped up but otherwise okay   Vision Screening Comments: [facility] screenings done at [facility]  Dietary issues and exercise activities discussed: Current Exercise Habits: Home exercise routine, Type of exercise: Other - see comments (water aerobics), Time (Minutes): 40, Frequency (Times/Week): 3, Weekly Exercise (Minutes/Week): 120, Intensity: Moderate, Exercise limited by: orthopedic condition(s)  Goals    . Chronic Care Management     CARE PLAN ENTRY (see longitudinal plan of care for additional care plan information)  Current Barriers:  . Chronic Disease Management support, education, and care coordination needs related to Hypertension and Hyperlipidemia   Hypertension BP Readings from Last 3 Encounters:  01/22/20 (!) 180/92  01/22/20 (!) 178/80  12/30/19 140/70   . Pharmacist Clinical Goal(s): o Over the next 90 days, patient will work with PharmD and providers to maintain BP goal <140/90. Readings above not typical. . Current regimen:  o Losartan 12.5mg  daily . Interventions: o None . Patient self care activities - Over the next 90 days, patient will: o Check BP daily, document, and provide at future appointments o Ensure daily salt intake < 2300 mg/day o Inform PharmD and/or provider  about other high BP readings  Hyperlipidemia Lab Results  Component Value Date/Time   LDLCALC 112 (H) 08/06/2019 08:28 AM   . Pharmacist Clinical Goal(s): o Over the next 90 days, patient will work with PharmD and providers to achieve LDL goal < 100 . Current regimen:  o Crestor 20mg  daily . Interventions: o Start CoQ10 100mg  1 tab by mouth daily (200, 300mg  also OK) . Patient self care activities - Over the next 90 days, patient will: o Report unexplained muscle pain  Medication management . Pharmacist Clinical Goal(s): o Over the next 90 days, patient will work with PharmD and providers to maintain optimal medication adherence . Current pharmacy: [facility] . Interventions o Comprehensive medication review performed. o Continue current medication management strategy . Patient self care activities - Over the next 90 days, patient will: o Focus on medication adherence by checking BP daily o Take medications as prescribed o Report any questions or concerns to PharmD and/or provider(s)  Initial goal documentation     . Increase water intake     Recommend increasing water intake to 6-8 glasses a day.       Depression Screen PHQ 2/9 Scores 03/04/2020 02/05/2020 02/05/2020 01/23/2020 12/03/2019 08/27/2019 08/06/2019  PHQ - 2 Score 1 1 1  0 0 0 0  PHQ- 9 Score 3 3 - - 0 0 0    Fall Risk Fall Risk  03/04/2020 02/05/2020 01/23/2020 12/03/2019 08/27/2019  Falls in the past year? 0 0 0 0 0  Comment - - - - -  Number falls in past yr: 0 0 0 0 0  Comment - - - - -  Injury with Fall? 0 0 0 0 0  Risk for fall due to : No Fall Risks - - - -  Follow up Falls prevention discussed Falls evaluation completed Falls evaluation completed Falls evaluation completed -    Any stairs in or around the home? Yes  If so, are there any without handrails? No  Home free of loose throw rugs in walkways, pet beds, electrical cords, etc? Yes  Adequate lighting in your home to reduce risk of falls? Yes   ASSISTIVE  DEVICES UTILIZED TO PREVENT FALLS:  Life alert? No  Use of a cane, walker or w/c? No  Grab  bars in the bathroom? Yes  Shower chair or bench in shower? No  Elevated toilet seat or a handicapped toilet? No   TIMED UP AND GO:  Was the test performed? Yes .  Length of time to ambulate 10 feet: 5 sec.   Gait steady and fast without use of assistive device  Cognitive Function:     6CIT Screen 03/04/2020 02/26/2018 11/20/2016  What Year? 0 points 0 points 0 points  What month? 0 points 0 points 0 points  What time? 0 points 0 points 0 points  Count back from 20 0 points 0 points 0 points  Months in reverse 0 points 0 points 0 points  Repeat phrase 0 points 0 points 0 points  Total Score 0 0 0    Immunizations Immunization History  Administered Date(s) Administered  . Fluad Quad(high Dose 65+) 02/11/2019, 01/23/2020  . Influenza, High Dose Seasonal PF 03/16/2015, 02/26/2018  . Influenza-Unspecified 03/16/2017  . PFIZER SARS-COV-2 Vaccination 06/21/2019, 07/15/2019  . Pneumococcal Conjugate-13 11/20/2016  . Pneumococcal Polysaccharide-23 05/01/2012, 02/26/2018    TDAP status: Due, Education has been provided regarding the importance of this vaccine. Advised may receive this vaccine at local pharmacy or Health Dept. Aware to provide a copy of the vaccination record if obtained from local pharmacy or Health Dept. Verbalized acceptance and understanding.   Flu Vaccine status: Up to date   Pneumococcal vaccine status: Completed during today's visit.   Covid-19 vaccine status: Completed vaccines  Qualifies for Shingles Vaccine? Yes   Zostavax completed No   Shingrix Completed?: No.    Education has been provided regarding the importance of this vaccine. Patient has been advised to call insurance company to determine out of pocket expense if they have not yet received this vaccine. Advised may also receive vaccine at local pharmacy or Health Dept. Verbalized acceptance and  understanding.  Screening Tests Health Maintenance  Topic Date Due  . DEXA SCAN  05/13/2020  . MAMMOGRAM  08/31/2020  . Fecal DNA (Cologuard)  09/28/2020  . INFLUENZA VACCINE  Completed  . COVID-19 Vaccine  Completed  . Hepatitis C Screening  Completed  . PNA vac Low Risk Adult  Completed  . TETANUS/TDAP  Discontinued    Health Maintenance  There are no preventive care reminders to display for this patient.  Colorectal cancer screening: Cologuard completed 09/28/17. Repeat every 3 years.   Mammogram status: Completed 09/01/19. Repeat every year   Bone Density status: Completed 05/13/18. Results reflect: Bone density results: OSTEOPENIA. Repeat every 2 years.  Lung Cancer Screening: (Low Dose CT Chest recommended if Age 48-80 years, 30 pack-year currently smoking OR have quit w/in 15years.) does not qualify.   Additional Screening:  Hepatitis C Screening: does qualify; Completed 09/06/17  Vision Screening: Recommended annual ophthalmology exams for early detection of glaucoma and other disorders of the eye. Is the patient up to date with their annual eye exam?  Yes  Who is the provider or what is the name of the office in which the patient attends annual eye exams? [place] Screening: Recommended annual dental exams for proper oral hygiene  Community Resource Referral / Chronic Care Management: CRR required this visit?  No   CCM required this visit?  No      Plan:     I have personally reviewed and noted the following in the patient's chart:   . Medical and social history . Use of alcohol, tobacco or illicit drugs  . Current medications and  supplements . Functional ability and status . Nutritional status . Physical activity . Advanced directives . List of other physicians . Hospitalizations, surgeries, and ER visits in previous 12 months . Vitals . Screenings to include cognitive, depression, and falls . Referrals and appointments  In  addition, I have reviewed and discussed with patient certain preventive protocols, quality metrics, and best practice recommendations. A written personalized care plan for preventive services as well as general preventive health recommendations were provided to patient.     Clemetine Marker, LPN   [id number]/11/[id number]   Nurse Notes: none

## 2020-03-04 NOTE — Patient Instructions (Signed)
[name] , Thank you for taking time to come for your Medicare Wellness Visit. I appreciate your ongoing commitment to your health goals. Please review the following plan we discussed and let me know if I can assist you in the future.   Screening recommendations/referrals: Colonoscopy: Cologuard done 09/28/17. Repeat in 2022.  Mammogram: done 09/01/19 Bone Density: done 05/13/18 Recommended yearly ophthalmology/optometry visit for glaucoma screening and checkup Recommended yearly dental visit for hygiene and checkup  Vaccinations: Influenza vaccine: done 01/23/20 Pneumococcal vaccine: done 02/26/18 Tdap vaccine: due Shingles vaccine: Shingrix discussed. Please contact your pharmacy for coverage information.  Covid-19: done 06/21/19 & 07/15/19  Advanced directives: Please bring a copy of your health care power of attorney and living will to the office at your convenience.  Conditions/risks identified: Recommend drinking 6-8 glasses of water per day   Next appointment: Follow up in one year for your annual wellness visit    Preventive Care 65 Years and Older, Female Preventive care refers to lifestyle choices and visits with your health care provider that can promote health and wellness. What does preventive care include?  A yearly physical exam. This is also called an annual well check.  Dental exams once or twice a year.  Routine eye exams. Ask your health care provider how often you should have your eyes checked.  Personal lifestyle choices, including:  Daily care of your teeth and gums.  Regular physical activity.  Eating a healthy diet.  Avoiding tobacco and drug use.  Limiting alcohol use.  Practicing safe sex.  Taking low-dose aspirin every day.  Taking vitamin and mineral supplements as recommended by your health care provider. What happens during an annual well check? The services and screenings done by your health care provider during your annual well  check will depend on your age, overall health, lifestyle risk factors, and family history of disease. Counseling  Your health care provider may ask you questions about your:  Alcohol use.  Tobacco use.  Drug use.  Emotional well-being.  Home and relationship well-being.  Sexual activity.  Eating habits.  History of falls.  Memory and ability to understand (cognition).  Work and work Statistician.  Reproductive health. Screening  You may have the following tests or measurements:  Height, weight, and BMI.  Blood pressure.  Lipid and cholesterol levels. These may be checked every 5 years, or more frequently if you are over 63 years old.  Skin check.  Lung cancer screening. You may have this screening every year starting at age 63 if you have a 30-pack-year history of smoking and currently smoke or have quit within the past 15 years.  Fecal occult blood test (FOBT) of the stool. You may have this test every year starting at age 39.  Flexible sigmoidoscopy or colonoscopy. You may have a sigmoidoscopy every 5 years or a colonoscopy every 10 years starting at age 58.  Hepatitis C blood test.  Hepatitis B blood test.  Sexually transmitted disease (STD) testing.  Diabetes screening. This is done by checking your blood sugar (glucose) after you have not eaten for a while (fasting). You may have this done every 1-3 years.  Bone density scan. This is done to screen for osteoporosis. You may have this done starting at age 88.  Mammogram. This may be done every 1-2 years. Talk to your health care provider about how often you should have regular mammograms. Talk with your health care provider about your test results, treatment options, and if necessary,  the need for more tests. Vaccines  Your health care provider may recommend certain vaccines, such as:  Influenza vaccine. This is recommended every year.  Tetanus, diphtheria, and acellular pertussis (Tdap, Td) vaccine. You  may need a Td booster every 10 years.  Zoster vaccine. You may need this after age 91.  Pneumococcal 13-valent conjugate (PCV13) vaccine. One dose is recommended after age 50.  Pneumococcal polysaccharide (PPSV23) vaccine. One dose is recommended after age 57. Talk to your health care provider about which screenings and vaccines you need and how often you need them. This information is not intended to replace advice given to you by your health care provider. Make sure you discuss any questions you have with your health care provider. Document Released: 05/14/2015 Document Revised: 01/05/2016 Document Reviewed: 02/16/2015 Elsevier Interactive Patient Education  2017 [employer] Prevention in the Home Falls can cause injuries. They can happen to people of all ages. There are many things you can do to make your home safe and to help prevent falls. What can I do on the outside of my home?  Regularly fix the edges of walkways and driveways and fix any cracks.  Remove anything that might make you trip as you walk through a door, such as a raised step or threshold.  Trim any bushes or trees on the path to your home.  Use bright outdoor lighting.  Clear any walking paths of anything that might make someone trip, such as rocks or tools.  Regularly check to see if handrails are loose or broken. Make sure that both sides of any steps have handrails.  Any raised decks and porches should have guardrails on the edges.  Have any leaves, snow, or ice cleared regularly.  Use sand or salt on walking paths during winter.  Clean up any spills in your garage right away. This includes oil or grease spills. What can I do in the bathroom?  Use night lights.  Install grab bars by the toilet and in the tub and shower. Do not use towel bars as grab bars.  Use non-skid mats or decals in the tub or shower.  If you need to sit down in the shower, use a plastic, non-slip stool.  Keep the floor  dry. Clean up any water that spills on the floor as soon as it happens.  Remove soap buildup in the tub or shower regularly.  Attach bath mats securely with double-sided non-slip rug tape.  Do not have throw rugs and other things on the floor that can make you trip. What can I do in the bedroom?  Use night lights.  Make sure that you have a light by your bed that is easy to reach.  Do not use any sheets or blankets that are too big for your bed. They should not hang down onto the floor.  Have a firm chair that has side arms. You can use this for support while you get dressed.  Do not have throw rugs and other things on the floor that can make you trip. What can I do in the kitchen?  Clean up any spills right away.  Avoid walking on wet floors.  Keep items that you use a lot in easy-to-reach places.  If you need to reach something above you, use a strong step stool that has a grab bar.  Keep electrical cords out of the way.  Do not use floor polish or wax that makes floors slippery. If you must use  wax, use non-skid floor wax.  Do not have throw rugs and other things on the floor that can make you trip. What can I do with my stairs?  Do not leave any items on the stairs.  Make sure that there are handrails on both sides of the stairs and use them. Fix handrails that are broken or loose. Make sure that handrails are as long as the stairways.  Check any carpeting to make sure that it is firmly attached to the stairs. Fix any carpet that is loose or worn.  Avoid having throw rugs at the top or bottom of the stairs. If you do have throw rugs, attach them to the floor with carpet tape.  Make sure that you have a light switch at the top of the stairs and the bottom of the stairs. If you do not have them, ask someone to add them for you. What else can I do to help prevent falls?  Wear shoes that:  Do not have high heels.  Have rubber bottoms.  Are comfortable and fit you  well.  Are closed at the toe. Do not wear sandals.  If you use a stepladder:  Make sure that it is fully opened. Do not climb a closed stepladder.  Make sure that both sides of the stepladder are locked into place.  Ask someone to hold it for you, if possible.  Clearly mark and make sure that you can see:  Any grab bars or handrails.  First and last steps.  Where the edge of each step is.  Use tools that help you move around (mobility aids) if they are needed. These include:  Canes.  Walkers.  Scooters.  Crutches.  Turn on the lights when you go into a dark area. Replace any light bulbs as soon as they burn out.  Set up your furniture so you have a clear path. Avoid moving your furniture around.  If any of your floors are uneven, fix them.  If there are any pets around you, be aware of where they are.  Review your medicines with your doctor. Some medicines can make you feel dizzy. This can increase your chance of falling. Ask your doctor what other things that you can do to help prevent falls. This information is not intended to replace advice given to you by your health care provider. Make sure you discuss any questions you have with your health care provider. Document Released: 02/11/2009 Document Revised: 09/23/2015 Document Reviewed: 05/22/2014 Elsevier Interactive Patient Education  2017 [employer].

## 2020-03-08 DIAGNOSIS — M545 Low back pain, unspecified: Secondary | ICD-10-CM | POA: Diagnosis not present

## 2020-03-08 DIAGNOSIS — M25551 Pain in right hip: Secondary | ICD-10-CM | POA: Diagnosis not present

## 2020-03-10 ENCOUNTER — Ambulatory Visit (INDEPENDENT_AMBULATORY_CARE_PROVIDER_SITE_OTHER): Payer: Medicare Other

## 2020-03-10 ENCOUNTER — Other Ambulatory Visit: Payer: Self-pay

## 2020-03-10 DIAGNOSIS — G4733 Obstructive sleep apnea (adult) (pediatric): Secondary | ICD-10-CM

## 2020-03-10 DIAGNOSIS — M25551 Pain in right hip: Secondary | ICD-10-CM | POA: Diagnosis not present

## 2020-03-10 DIAGNOSIS — M545 Low back pain, unspecified: Secondary | ICD-10-CM | POA: Diagnosis not present

## 2020-03-10 NOTE — Progress Notes (Signed)
New Cpap Setup  She was setup on resmed S-11 at 6 cmH20 with humidifier, tubing and a resmed P-10  Nasal pillow small. She has a good understand of using, cleaning and compliance of cpap.

## 2020-03-13 ENCOUNTER — Other Ambulatory Visit: Payer: Self-pay | Admitting: Family Medicine

## 2020-03-13 DIAGNOSIS — I10 Essential (primary) hypertension: Secondary | ICD-10-CM

## 2020-03-15 DIAGNOSIS — M25551 Pain in right hip: Secondary | ICD-10-CM | POA: Diagnosis not present

## 2020-03-15 DIAGNOSIS — M545 Low back pain, unspecified: Secondary | ICD-10-CM | POA: Diagnosis not present

## 2020-03-18 DIAGNOSIS — M545 Low back pain, unspecified: Secondary | ICD-10-CM | POA: Diagnosis not present

## 2020-03-18 DIAGNOSIS — M25551 Pain in right hip: Secondary | ICD-10-CM | POA: Diagnosis not present

## 2020-03-22 DIAGNOSIS — M545 Low back pain, unspecified: Secondary | ICD-10-CM | POA: Diagnosis not present

## 2020-03-22 DIAGNOSIS — M25551 Pain in right hip: Secondary | ICD-10-CM | POA: Diagnosis not present

## 2020-03-29 DIAGNOSIS — M545 Low back pain, unspecified: Secondary | ICD-10-CM | POA: Diagnosis not present

## 2020-03-29 DIAGNOSIS — M25551 Pain in right hip: Secondary | ICD-10-CM | POA: Diagnosis not present

## 2020-03-30 ENCOUNTER — Ambulatory Visit: Payer: Medicare Other | Admitting: Internal Medicine

## 2020-03-31 DIAGNOSIS — Z23 Encounter for immunization: Secondary | ICD-10-CM | POA: Diagnosis not present

## 2020-04-07 ENCOUNTER — Other Ambulatory Visit: Payer: Self-pay

## 2020-04-07 ENCOUNTER — Ambulatory Visit (INDEPENDENT_AMBULATORY_CARE_PROVIDER_SITE_OTHER): Payer: Medicare Other

## 2020-04-07 DIAGNOSIS — G4733 Obstructive sleep apnea (adult) (pediatric): Secondary | ICD-10-CM | POA: Diagnosis not present

## 2020-04-07 NOTE — Progress Notes (Signed)
95 percentile pressure  6   95th percentile leak 9.1   apnea index 0.1 /hr  apnea-hypopnea index  0.1 /hr   total days used  >4 hr 28 days  total days used <4 hr 0 days  Total compliance 100 percent  She is doing amazing, really glad she got machine. No problems or question

## 2020-04-20 ENCOUNTER — Encounter: Payer: Self-pay | Admitting: Internal Medicine

## 2020-04-20 ENCOUNTER — Ambulatory Visit (INDEPENDENT_AMBULATORY_CARE_PROVIDER_SITE_OTHER): Payer: Medicare Other | Admitting: Internal Medicine

## 2020-04-20 ENCOUNTER — Telehealth: Payer: 59

## 2020-04-20 ENCOUNTER — Other Ambulatory Visit: Payer: Self-pay

## 2020-04-20 VITALS — BP 126/66 | HR 70 | Temp 97.2°F | Resp 16 | Ht 66.0 in | Wt 170.6 lb

## 2020-04-20 DIAGNOSIS — Z9989 Dependence on other enabling machines and devices: Secondary | ICD-10-CM | POA: Diagnosis not present

## 2020-04-20 DIAGNOSIS — Z7189 Other specified counseling: Secondary | ICD-10-CM

## 2020-04-20 DIAGNOSIS — G4733 Obstructive sleep apnea (adult) (pediatric): Secondary | ICD-10-CM

## 2020-04-20 NOTE — Progress Notes (Signed)
[facility] [place], [place] [address]  Pulmonary Sleep Medicine   Office Visit Note  Patient Name: Paige [name] DOB: Sep 18, 1945 MRN [id number]  Date of Service: 04/20/2020  Complaints/HPI: Patient is here for routine pulmonary follow-up Followed for OSA on CPAP therapy--new user of CPAP, has been on therapy for shortly over a month Feels great benefit from wearing her CPAP--sleeping better at night and waking up feeling refreshed and energized Has been wearing CPAP nightly and cleaning machine by hand as directed Recent 30 day download shows 100% compliance, AHI well controlled at 0.1  ROS  General: (-) fever, (-) chills, (-) night sweats, (-) weakness Skin: (-) rashes, (-) itching,. Eyes: (-) visual changes, (-) redness, (-) itching. Nose and Sinuses: (-) nasal stuffiness or itchiness, (-) postnasal drip, (-) nosebleeds, (-) sinus trouble. Mouth and Throat: (-) sore throat, (-) hoarseness. Neck: (-) swollen glands, (-) enlarged thyroid, (-) neck pain. Respiratory: - cough, (-) bloody sputum, - shortness of breath,- wheezing. Cardiovascular: - ankle swelling, (-) chest pain. Lymphatic: (-) lymph node enlargement. Neurologic: (-) numbness, (-) tingling. Psychiatric: (-) anxiety, (-) depression   Current Medication: Outpatient Encounter Medications as of 04/20/2020  Medication Sig  . aspirin 81 MG tablet Take 1 tablet by mouth daily.  . Calcium-Phosphorus-Vitamin D 100-50-100 MG-MG-UNIT CHEW Chew 1 tablet by mouth.  . co-enzyme Q-10 30 MG capsule Take 30 mg by mouth 3 (three) times daily.  . cyanocobalamin 100 MCG tablet Take 1,000 mcg by mouth daily.   . fluticasone (FLONASE) 50 MCG/ACT nasal spray Place 1 spray into both nostrils daily. (Patient taking differently: Place 1 spray into both nostrils daily. rarely)  . gabapentin (NEURONTIN) 100 MG capsule Take 1-2 capsules (100-200 mg total) by mouth at bedtime.  Marland Kitchen glucosamine-chondroitin  500-400 MG tablet Take 1 tablet by mouth 3 (three) times daily.  Marland Kitchen levothyroxine (SYNTHROID) 88 MCG tablet TAKE 1 TABLET DAILY BEFORE BREAKFAST  . losartan (COZAAR) 25 MG tablet TAKE 1/2 TABLET DAILY  . meloxicam (MOBIC) 7.5 MG tablet Take 1 tablet (7.5 mg total) by mouth daily.  . MULTIPLE VITAMIN PO Take by mouth daily.  . Multiple Vitamins-Minerals (HAIR SKIN AND NAILS FORMULA PO) Take by mouth.   . Omega-3 Fatty Acids (FISH OIL BURP-LESS) 1200 MG CAPS Take 1 capsule by mouth.  . rosuvastatin (CRESTOR) 20 MG tablet TAKE 1 TABLET DAILY  . Turmeric 500 MG TABS Take 1 tablet by mouth daily.   No facility-administered encounter medications on file as of 04/20/2020.    Surgical History: History reviewed. No pertinent surgical history.  Medical History: Past Medical History:  Diagnosis Date  . Hyperlipidemia   . Hypertension   . Thyroid disease     Family History: Family History  Problem Relation Age of Onset  . Cancer Mother   . Diabetes Father   . Stroke Father   . Breast cancer Neg Hx     Social History: Social History   Socioeconomic History  . Marital status: Married    Spouse name: Not on file  . Number of children: 0  . Years of education: Not on file  . Highest education level: High school graduate  Occupational History  . Occupation: Retired  . Occupation: Armed forces operational officer  Tobacco Use  . Smoking status: Former Smoker    Packs/day: 0.25    Years: 5.00    Pack years: 1.25    Types: Cigarettes    Start date: 05/01/1972    Quit date:  05/01/1977    Years since quitting: 43.0  . Smokeless tobacco: Never Used  Vaping Use  . Vaping Use: Never used  Substance and Sexual Activity  . Alcohol use: No  . Drug use: No  . Sexual activity: Yes    Partners: Male  Other Topics Concern  . Not on file  Social History Narrative   Second marriage, widow from first marriage at age 38 and her current husband had two teenagers who she help raise    Social Determinants of  Health   Financial Resource Strain: Low Risk   . Difficulty of Paying Living Expenses: Not hard at all  Food Insecurity: No Food Insecurity  . Worried About Charity fundraiser in the Last Year: Never true  . Ran Out of Food in the Last Year: Never true  Transportation Needs: No Transportation Needs  . Lack of Transportation (Medical): No  . Lack of Transportation (Non-Medical): No  Physical Activity: Insufficiently Active  . Days of Exercise per Week: 3 days  . Minutes of Exercise per Session: 40 min  Stress: No Stress Concern Present  . Feeling of Stress : Only a little  Social Connections: Socially Integrated  . Frequency of Communication with Friends and Family: More than three times a week  . Frequency of Social Gatherings with Friends and Family: More than three times a week  . Attends Religious Services: More than 4 times per year  . Active Member of Clubs or Organizations: Yes  . Attends Archivist Meetings: More than 4 times per year  . Marital Status: Married  Human resources officer Violence: Not At Risk  . Fear of Current or Ex-Partner: No  . Emotionally Abused: No  . Physically Abused: No  . Sexually Abused: No    Vital Signs: Blood pressure 126/66, pulse 70, temperature (!) 97.2 F (36.2 C), resp. rate 16, height 5\' 6"  (1.676 m), weight 170 lb 9.6 oz (77.4 kg), SpO2 99 %.  Examination: General Appearance: The patient is well-developed, well-nourished, and in no distress. Skin: Gross inspection of skin unremarkable. Head: normocephalic, no gross deformities. Eyes: no gross deformities noted. ENT: ears appear grossly normal no exudates. Neck: Supple. No thyromegaly. No LAD. Respiratory: Clear throughout, no wheezing, rhonchi or rales noted. Cardiovascular: Normal S1 and S2 without murmur or rub. Extremities: No cyanosis. pulses are equal. Neurologic: Alert and oriented. No involuntary movements.  LABS: Recent Results (from the past 2160 hour(s))  Lipid  panel     Status: Abnormal   Collection Time: 02/05/20  8:49 AM  Result Value Ref Range   Cholesterol 133 <200 mg/dL   HDL 47 (L) > OR = 50 mg/dL   Triglycerides 79 <150 mg/dL   LDL Cholesterol (Calc) 70 mg/dL (calc)    Comment: Reference range: <100 . Desirable range <100 mg/dL for primary prevention;   <70 mg/dL for patients with CHD or diabetic patients  with > or = 2 CHD risk factors. Marland Kitchen LDL-C is now calculated using the Martin-Hopkins  calculation, which is a validated novel method providing  better accuracy than the Friedewald equation in the  estimation of LDL-C.  Cresenciano Genre et al. Annamaria Helling. 0960;454(09): 2061-2068  (http://education.[facility].com/faq/FAQ164)    Total CHOL/HDL Ratio 2.8 <5.0 (calc)   Non-HDL Cholesterol (Calc) 86 <130 mg/dL (calc)    Comment: For patients with diabetes plus 1 major ASCVD risk  factor, treating to a non-HDL-C goal of <100 mg/dL  (LDL-C of <70 mg/dL) is considered a therapeutic  option.  Hepatic function panel     Status: None   Collection Time: 02/05/20  8:49 AM  Result Value Ref Range   Total Protein 7.2 6.1 - 8.1 g/dL   Albumin 4.4 3.6 - 5.1 g/dL   Globulin 2.8 1.9 - 3.7 g/dL (calc)   AG Ratio 1.6 1.0 - 2.5 (calc)   Total Bilirubin 0.7 0.2 - 1.2 mg/dL   Bilirubin, Direct 0.2 0.0 - 0.2 mg/dL   Indirect Bilirubin 0.5 0.2 - 1.2 mg/dL (calc)   Alkaline phosphatase (APISO) 45 37 - 153 U/L   AST 19 10 - 35 U/L   ALT 15 6 - 29 U/L    Radiology: MM 3D SCREEN BREAST BILATERAL  Result Date: 09/01/2019 CLINICAL DATA:  Screening. EXAM: DIGITAL SCREENING BILATERAL MAMMOGRAM WITH TOMO AND CAD COMPARISON:  Previous exam(s). ACR Breast Density Category b: There are scattered areas of fibroglandular density. FINDINGS: There are no findings suspicious for malignancy. Images were processed with CAD. IMPRESSION: No mammographic evidence of malignancy. A result letter of this screening mammogram will be mailed directly to the patient. RECOMMENDATION:  Screening mammogram in one year. (Code:SM-B-01Y) BI-RADS CATEGORY  1: Negative. Electronically Signed   By: Nolon Nations M.D.   On: 09/01/2019 12:46   Assessment and Plan: Patient Active Problem List   Diagnosis Date Noted  . OSA (obstructive sleep apnea) 02/05/2020  . Essential hypertension 08/28/2018  . Osteopenia 08/28/2018  . Hyperlipidemia 10/27/2014  . Allergic rhinitis 10/27/2014  . Adult hypothyroidism 10/13/2014   1. OSA on CPAP Continue with nightly compliance, advised to contact office with any complications, will repeat download in 6 months  2. CPAP use counseling Discussed importance of adequate CPAP use as well as proper care and cleaning techniques of machine and all supplies.  General Counseling: I have discussed the findings of the evaluation and examination with Janne.  I have also discussed any further diagnostic evaluation thatmay be needed or ordered today. Marycatherine verbalizes understanding of the findings of todays visit. We also reviewed her medications today and discussed drug interactions and side effects including but not limited excessive drowsiness and altered mental states. We also discussed that there is always a risk not just to her but also people around her. she has been encouraged to call the office with any questions or concerns that should arise related to todays visit.     Time spent: 20  I have personally obtained a history, examined the patient, evaluated laboratory and imaging results, formulated the assessment and plan and placed orders. This patient was seen by Casey Burkitt AGNP-C in Collaboration with Dr. Devona Konig as a part of collaborative care agreement.    Allyne Gee, MD [facility] Sleep medicine

## 2020-04-30 ENCOUNTER — Encounter: Payer: Self-pay | Admitting: Internal Medicine

## 2020-04-30 NOTE — Patient Instructions (Signed)

## 2020-05-10 DIAGNOSIS — G4733 Obstructive sleep apnea (adult) (pediatric): Secondary | ICD-10-CM | POA: Diagnosis not present

## 2020-05-13 DIAGNOSIS — G4733 Obstructive sleep apnea (adult) (pediatric): Secondary | ICD-10-CM | POA: Diagnosis not present

## 2020-05-14 ENCOUNTER — Other Ambulatory Visit: Payer: Self-pay | Admitting: Family Medicine

## 2020-05-14 DIAGNOSIS — I10 Essential (primary) hypertension: Secondary | ICD-10-CM

## 2020-05-14 DIAGNOSIS — E039 Hypothyroidism, unspecified: Secondary | ICD-10-CM

## 2020-05-14 DIAGNOSIS — E7800 Pure hypercholesterolemia, unspecified: Secondary | ICD-10-CM

## 2020-05-14 MED ORDER — LEVOTHYROXINE SODIUM 88 MCG PO TABS: 88.0000 ug | ORAL_TABLET | Freq: Every day | ORAL | 1 refills | Status: DC

## 2020-05-14 MED ORDER — LOSARTAN POTASSIUM 25 MG PO TABS: ORAL_TABLET | ORAL | 1 refills | Status: DC

## 2020-05-14 MED ORDER — ROSUVASTATIN CALCIUM 20 MG PO TABS: 20.0000 mg | ORAL_TABLET | Freq: Every day | ORAL | 1 refills | Status: DC

## 2020-05-25 NOTE — Progress Notes (Signed)
Name: Paige [name]   MRN: [id number]    DOB: 05/15/1945   Date:05/26/2020       Progress Note  Subjective  Chief Complaint  Nose Bleed  HPI   Virtual Visit via Telephone Note  I connected with Paige [name] on 05/26/20 at  3:40 PM EST by telephone and verified that I am speaking with the correct person using two identifiers.  Location: Patient: at home  Provider: [facility]   Sinus pressure: she states symptoms started a couple of weeks ago. She went to [place] and a couple of days prior to her return she noticed some sneezing, followed by nasal congestion, when she blows her nose she has some blood mixed with mucus. She has mild facial pressure, no headache. No fever or chills. She has normal appetite. She has been using Flonase consistently since she started CPAP a couple of months ago. CPAP has humidifier Used saline spray last night and is feeling better today   Patient Active Problem List   Diagnosis Date Noted  . OSA (obstructive sleep apnea) 02/05/2020  . Essential hypertension 08/28/2018  . Osteopenia 08/28/2018  . Hyperlipidemia 10/27/2014  . Allergic rhinitis 10/27/2014  . Adult hypothyroidism 10/13/2014    History reviewed. No pertinent surgical history.  Family History  Problem Relation Age of Onset  . Cancer Mother   . Diabetes Father   . Stroke Father   . Breast cancer Neg Hx     Social History   Tobacco Use  . Smoking status: Former Smoker    Packs/day: 0.25    Years: 5.00    Pack years: 1.25    Types: Cigarettes    Start date: 05/01/1972    Quit date: 05/01/1977    Years since quitting: 43.0  . Smokeless tobacco: Never Used  Substance Use Topics  . Alcohol use: No     Current Outpatient Medications:  .  azelastine (ASTELIN) 0.1 % nasal spray, Place 2 sprays into both nostrils 2 (two) times daily. Use in each nostril as directed, Disp: 30 mL, Rfl: 0 .  aspirin 81 MG tablet, Take 1 tablet by mouth daily., Disp: , Rfl:  .   Calcium-Phosphorus-Vitamin D 100-50-100 MG-MG-UNIT CHEW, Chew 1 tablet by mouth., Disp: , Rfl:  .  co-enzyme Q-10 30 MG capsule, Take 30 mg by mouth 3 (three) times daily., Disp: , Rfl:  .  cyanocobalamin 100 MCG tablet, Take 1,000 mcg by mouth daily. , Disp: , Rfl:  .  fluticasone (FLONASE) 50 MCG/ACT nasal spray, Place 1 spray into both nostrils daily. (Patient taking differently: Place 1 spray into both nostrils daily. rarely), Disp: 16 g, Rfl: 3 .  gabapentin (NEURONTIN) 100 MG capsule, Take 1-2 capsules (100-200 mg total) by mouth at bedtime., Disp: 180 capsule, Rfl: 0 .  glucosamine-chondroitin 500-400 MG tablet, Take 1 tablet by mouth 3 (three) times daily., Disp: , Rfl:  .  levothyroxine (SYNTHROID) 88 MCG tablet, Take 1 tablet (88 mcg total) by mouth daily before breakfast., Disp: 90 tablet, Rfl: 1 .  losartan (COZAAR) 25 MG tablet, TAKE 1/2 TABLET DAILY, Disp: 45 tablet, Rfl: 1 .  meloxicam (MOBIC) 7.5 MG tablet, Take 1 tablet (7.5 mg total) by mouth daily., Disp: 90 tablet, Rfl: 0 .  MULTIPLE VITAMIN PO, Take by mouth daily., Disp: , Rfl:  .  Multiple Vitamins-Minerals (HAIR SKIN AND NAILS FORMULA PO), Take by mouth. , Disp: , Rfl:  .  Omega-3 Fatty Acids (FISH OIL BURP-LESS) 1200 MG CAPS, Take  1 capsule by mouth., Disp: , Rfl:  .  rosuvastatin (CRESTOR) 20 MG tablet, Take 1 tablet (20 mg total) by mouth daily., Disp: 90 tablet, Rfl: 1 .  Turmeric 500 MG TABS, Take 1 tablet by mouth daily., Disp: , Rfl:   Allergies  Allergen Reactions  . Codeine Nausea And Vomiting    I personally reviewed active problem list, medication list, allergies, family history with the patient/caregiver today.   ROS  Ten systems reviewed and is negative except as mentioned in HPI   Objective  Vitals:   05/26/20 1017  Weight: 170 lb (77.1 kg)  Height: 5\' 6"  (1.676 m)     Physical Exam  Awake, alert and oriented   PHQ2/9: Depression screen [facility] 2/9 05/26/2020 03/04/2020 02/05/2020 02/05/2020  01/23/2020  Decreased Interest 1 0 0 0 0  Down, Depressed, Hopeless 1 1 1 1  0  PHQ - 2 Score 2 1 1 1  0  Altered sleeping 0 1 1 - -  Tired, decreased energy 0 1 1 - -  Change in appetite 0 0 0 - -  Feeling bad or failure about yourself  0 0 0 - -  Trouble concentrating 0 0 0 - -  Moving slowly or fidgety/restless 0 0 0 - -  Suicidal thoughts 0 0 0 - -  PHQ-9 Score 2 3 3  - -  Difficult doing work/chores Not difficult at all Not difficult at all - - -  Some recent data might be hidden    phq 9 is positive   Fall Risk: Fall Risk  05/26/2020 03/04/2020 02/05/2020 01/23/2020 12/03/2019  Falls in the past year? 0 0 0 0 0  Comment - - - - -  Number falls in past yr: 0 0 0 0 0  Comment - - - - -  Injury with Fall? 0 0 0 0 0  Risk for fall due to : - No Fall Risks - - -  Follow up - Falls prevention discussed Falls evaluation completed Falls evaluation completed Falls evaluation completed     Functional Status Survey: Is the patient deaf or have difficulty hearing?: No Does the patient have difficulty seeing, even when wearing glasses/contacts?: No Does the patient have difficulty concentrating, remembering, or making decisions?: No Does the patient have difficulty walking or climbing stairs?: No Does the patient have difficulty dressing or bathing?: No Does the patient have difficulty doing errands alone such as visiting a doctor's office or shopping?: No   Assessment & Plan  1. Nasal congestion  Hold flonase until nose bleed stops, try astelin and continue nasal spray up to 4 times daily - azelastine (ASTELIN) 0.1 % nasal spray; Place 2 sprays into both nostrils 2 (two) times daily. Use in each nostril as directed  Dispense: 30 mL; Refill: 0  2. Epistaxis  Mild, likely from recent trip to [place] and also daily use of flonase, discussed what to do to stop significant nose bleeds and when to [facility]  I discussed the assessment and treatment plan with the patient. The patient was  provided an opportunity to ask questions and all were answered. The patient agreed with the plan and demonstrated an understanding of the instructions.   The patient was advised to call back or seek an in-person evaluation if the symptoms worsen or if the condition fails to improve as anticipated.  I provided 15  minutes of non-face-to-face time during this encounter.  Loistine Chance, MD

## 2020-05-26 ENCOUNTER — Encounter: Payer: Self-pay | Admitting: Family Medicine

## 2020-05-26 ENCOUNTER — Telehealth (INDEPENDENT_AMBULATORY_CARE_PROVIDER_SITE_OTHER): Payer: Medicare HMO | Admitting: Family Medicine

## 2020-05-26 VITALS — Ht 66.0 in | Wt 170.0 lb

## 2020-05-26 DIAGNOSIS — R0981 Nasal congestion: Secondary | ICD-10-CM

## 2020-05-26 DIAGNOSIS — R040 Epistaxis: Secondary | ICD-10-CM | POA: Diagnosis not present

## 2020-05-26 MED ORDER — AZELASTINE HCL 0.1 % NA SOLN: 2.0000 | Freq: Two times a day (BID) | NASAL | 0 refills | Status: DC

## 2020-06-10 DIAGNOSIS — G4733 Obstructive sleep apnea (adult) (pediatric): Secondary | ICD-10-CM | POA: Diagnosis not present

## 2020-06-14 DIAGNOSIS — G4733 Obstructive sleep apnea (adult) (pediatric): Secondary | ICD-10-CM | POA: Diagnosis not present

## 2020-06-17 ENCOUNTER — Telehealth: Payer: 59

## 2020-06-17 ENCOUNTER — Other Ambulatory Visit: Payer: Self-pay | Admitting: Family Medicine

## 2020-06-17 DIAGNOSIS — R0981 Nasal congestion: Secondary | ICD-10-CM

## 2020-06-30 ENCOUNTER — Telehealth: Payer: Self-pay

## 2020-06-30 NOTE — Progress Notes (Signed)
Spoke to patient to confirmed patient telephone appointment on 07/01/2020 for CCM at 3:30 pm with Junius Argyle the Clinical pharmacist.    Patient Verbalized understanding.  [place] Pharmacist Assistant [phone]

## 2020-07-01 ENCOUNTER — Telehealth: Payer: Medicare HMO

## 2020-07-03 ENCOUNTER — Other Ambulatory Visit: Payer: Self-pay | Admitting: Family Medicine

## 2020-07-03 DIAGNOSIS — R0981 Nasal congestion: Secondary | ICD-10-CM

## 2020-07-03 NOTE — Telephone Encounter (Signed)
Routing to office- please review pharmacy request for 90 day refill.

## 2020-07-08 DIAGNOSIS — G4733 Obstructive sleep apnea (adult) (pediatric): Secondary | ICD-10-CM | POA: Diagnosis not present

## 2020-07-13 ENCOUNTER — Telehealth: Payer: Self-pay

## 2020-07-13 NOTE — Progress Notes (Signed)
Spoke to patient to confirmed patient telephone appointment on 07/14/2020 for CCM at 9:30 am with Junius Argyle the Clinical pharmacist.   Patient verbalized understanding and denies any side effects from current medications.  [place] Pharmacist Assistant [phone] \

## 2020-07-14 ENCOUNTER — Ambulatory Visit (INDEPENDENT_AMBULATORY_CARE_PROVIDER_SITE_OTHER): Payer: Medicare HMO

## 2020-07-14 DIAGNOSIS — I10 Essential (primary) hypertension: Secondary | ICD-10-CM

## 2020-07-14 DIAGNOSIS — E7800 Pure hypercholesterolemia, unspecified: Secondary | ICD-10-CM | POA: Diagnosis not present

## 2020-07-14 DIAGNOSIS — E039 Hypothyroidism, unspecified: Secondary | ICD-10-CM | POA: Diagnosis not present

## 2020-07-14 DIAGNOSIS — M85852 Other specified disorders of bone density and structure, left thigh: Secondary | ICD-10-CM

## 2020-07-14 NOTE — Progress Notes (Signed)
Chronic Care Management Pharmacy Note  07/14/2020 Name:  Paige [name] MRN:  [id number] DOB:  November 13, 1945  Subjective: Paige [name] is an 75 y.o. year old female who is a primary patient of Steele Sizer, MD.  The CCM team was consulted for assistance with disease management and care coordination needs.    Engaged with patient by telephone for follow up visit in response to provider referral for pharmacy case management and/or care coordination services.   Consent to Services:  The patient was given information about Chronic Care Management services, agreed to services, and gave verbal consent prior to initiation of services.  Please see initial visit note for detailed documentation.   Patient Care Team: Steele Sizer, MD as PCP - General (Family Medicine) Birder Robson, MD as Referring Physician (Ophthalmology) Carloyn Manner, MD as Referring Physician (Otolaryngology) Germaine Pomfret, [facility] as Pharmacist (Pharmacist)  Recent office visits: 05/26/2020: Video visit with Dr. Ancil Boozer for Nasal Congestion. Patient started on azelastine spray.  03/04/20: Patient presented to Clemetine Marker, LPN for AWV.   Recent consult visits: 04/20/20: Patient presented to Dr. Humphrey Rolls (Pulmonology) for OSA follow-up.   Hospital visits: None in previous 6 months  Objective:  Lab Results  Component Value Date   CREATININE 0.92 08/06/2019   BUN 23 08/06/2019   GFRNONAA 63 08/06/2019   GFRAA 73 08/06/2019   NA 145 08/06/2019   K 4.3 08/06/2019   CALCIUM 9.4 08/06/2019   CO2 31 08/06/2019    Lab Results  Component Value Date/Time   HGBA1C 5.1 08/06/2019 08:28 AM    Last diabetic Eye exam: No results found for: HMDIABEYEEXA  Last diabetic Foot exam: No results found for: HMDIABFOOTEX   Lab Results  Component Value Date   CHOL 133 02/05/2020   HDL 47 (L) 02/05/2020   LDLCALC 70 02/05/2020   TRIG 79 02/05/2020   CHOLHDL 2.8 02/05/2020    Hepatic Function Latest  Ref Rng & Units 02/05/2020 08/06/2019 09/02/2018  Total Protein 6.1 - 8.1 g/dL 7.2 6.9 7.1  Albumin 3.6 - 4.8 g/dL - - -  AST 10 - 35 U/L $Remo'19 19 18  'yaqIw$ ALT 6 - 29 U/L $Remo'15 17 15  'ctqFu$ Alk Phosphatase 39 - 117 IU/L - - -  Total Bilirubin 0.2 - 1.2 mg/dL 0.7 0.7 0.7  Bilirubin, Direct 0.0 - 0.2 mg/dL 0.2 - -    Lab Results  Component Value Date/Time   TSH 1.42 08/06/2019 08:28 AM   TSH 1.47 09/02/2018 08:09 AM   FREET4 1.46 10/13/2014 04:29 PM    CBC Latest Ref Rng & Units 08/06/2019  WBC 3.8 - 10.8 Thousand/uL 5.5  Hemoglobin 11.7 - 15.5 g/dL 13.6  Hematocrit 35.0 - 45.0 % 41.5  Platelets 140 - 400 Thousand/uL 181   Last DEXA Scan: 05/13/18   T-Score femoral neck: -1.4  T-Score total hip: NA  T-Score lumbar spine: +0.5  T-Score forearm radius: NA  10-year probability of major osteoporotic fracture: 9.4%  10-year probability of hip fracture: 1.3%   No results found for: VD25OH  Clinical ASCVD: No  The 10-year ASCVD risk score Mikey Bussing DC Jr., et al., 2013) is: 14.1%   Values used to calculate the score:     Age: 16 years     Sex: Female     Is Non-Hispanic African American: No     Diabetic: No     Tobacco smoker: No     Systolic Blood Pressure: 751 mmHg     Is BP treated:  Yes     HDL Cholesterol: 47 mg/dL     Total Cholesterol: 133 mg/dL    Depression screen [facility] 2/9 05/26/2020 03/04/2020 02/05/2020  Decreased Interest 1 0 0  Down, Depressed, Hopeless 1 1 1   PHQ - 2 Score 2 1 1   Altered sleeping 0 1 1  Tired, decreased energy 0 1 1  Change in appetite 0 0 0  Feeling bad or failure about yourself  0 0 0  Trouble concentrating 0 0 0  Moving slowly or fidgety/restless 0 0 0  Suicidal thoughts 0 0 0  PHQ-9 Score 2 3 3   Difficult doing work/chores Not difficult at all Not difficult at all -  Some recent data might be hidden     Social History   Tobacco Use  Smoking Status Former Smoker  . Packs/day: 0.25  . Years: 5.00  . Pack years: 1.25  . Types: Cigarettes  . Start date:  05/01/1972  . Quit date: 05/01/1977  . Years since quitting: 43.2  Smokeless Tobacco Never Used   BP Readings from Last 3 Encounters:  04/20/20 126/66  03/04/20 (!) 142/80  02/26/20 (!) 148/78   Pulse Readings from Last 3 Encounters:  04/20/20 70  03/04/20 66  02/26/20 88   Wt Readings from Last 3 Encounters:  05/26/20 170 lb (77.1 kg)  04/20/20 170 lb 9.6 oz (77.4 kg)  03/04/20 169 lb (76.7 kg)    Assessment/Interventions: Review of patient past medical history, allergies, medications, health status, including review of consultants reports, laboratory and other test data, was performed as part of comprehensive evaluation and provision of chronic care management services.   SDOH:  (Social Determinants of Health) assessments and interventions performed: Yes SDOH Interventions   Flowsheet Row Most Recent Value  SDOH Interventions   Financial Strain Interventions Intervention Not Indicated      CCM Care Plan  Allergies  Allergen Reactions  . Codeine Nausea And Vomiting    Medications Reviewed Today    Reviewed by Steele Sizer, MD (Physician) on 05/26/20 at 1446  Med List Status: <None>  Medication Order Taking? Sig Documenting Provider Last Dose Status Informant  aspirin 81 MG tablet [id number] No Take 1 tablet by mouth daily. Roselee Nova, MD Taking Active            Med Note Gloris Ham, [place] [employer] D   Tue Mar 04, 2019  1:57 PM)    azelastine (ASTELIN) 0.1 % nasal spray [id number] Yes Place 2 sprays into both nostrils 2 (two) times daily. Use in each nostril as directed Steele Sizer, MD  Active   Calcium-Phosphorus-Vitamin D 100-50-100 MG-MG-UNIT CHEW [id number] No Chew 1 tablet by mouth. Roselee Nova, MD Taking Active            Med Note Clemetine Marker D   Tue Mar 04, 2019  1:57 PM)    co-enzyme Q-10 30 MG capsule [id number] No Take 30 mg by mouth 3 (three) times daily. [provider] Taking Active   cyanocobalamin 100 MCG tablet [id number] No Take 1,000 mcg by  mouth daily.  [provider] Taking Active Self  fluticasone (FLONASE) 50 MCG/ACT nasal spray [id number] No Place 1 spray into both nostrils daily.  Patient taking differently: Place 1 spray into both nostrils daily. rarely   Hubbard Hartshorn, FNP Taking Active   gabapentin (NEURONTIN) 100 MG capsule [id number] No Take 1-2 capsules (100-200 mg total) by mouth at bedtime. Steele Sizer, MD Taking Active   glucosamine-chondroitin  500-400 MG tablet [id number] No Take 1 tablet by mouth 3 (three) times daily. [provider] Taking Active   levothyroxine (SYNTHROID) 88 MCG tablet [id number]  Take 1 tablet (88 mcg total) by mouth daily before breakfast. Steele Sizer, MD  Active   losartan (COZAAR) 25 MG tablet [id number]  TAKE 1/2 TABLET DAILY Steele Sizer, MD  Active   meloxicam (MOBIC) 7.5 MG tablet [id number] No Take 1 tablet (7.5 mg total) by mouth daily. Steele Sizer, MD Taking Active   MULTIPLE VITAMIN PO [id number] No Take by mouth daily. Roselee Nova, MD Taking Active            Med Note Clemetine Marker D   Tue Mar 04, 2019  1:58 PM)    Multiple Vitamins-Minerals [facility] SKIN AND NAILS FORMULA PO) [id number] No Take by mouth.  [provider] Taking Active   Omega-3 Fatty Acids (FISH OIL BURP-LESS) 1200 MG CAPS [id number] No Take 1 capsule by mouth. Roselee Nova, MD Taking Active            Med Note Gloris Ham, [place] [employer] [facility]   Tue Mar 04, 2019  1:58 PM)    rosuvastatin (CRESTOR) 20 MG tablet [id number]  Take 1 tablet (20 mg total) by mouth daily. Steele Sizer, MD  Active   Turmeric 500 MG TABS [id number] No Take 1 tablet by mouth daily. [provider] Taking Active Self          Patient Active Problem List   Diagnosis Date Noted  . OSA (obstructive sleep apnea) 02/05/2020  . Essential hypertension 08/28/2018  . Osteopenia 08/28/2018  . Hyperlipidemia 10/27/2014  . Allergic rhinitis 10/27/2014  . Adult hypothyroidism 10/13/2014    Immunization  History  Administered Date(s) Administered  . Fluad Quad(high Dose 65+) 02/11/2019, 01/23/2020  . Influenza, High Dose Seasonal PF 03/16/2015, 02/26/2018  . Influenza-Unspecified 03/16/2017  . PFIZER(Purple Top)SARS-COV-2 Vaccination 06/21/2019, 07/15/2019, 03/31/2020  . Pneumococcal Conjugate-13 11/20/2016  . Pneumococcal Polysaccharide-23 05/01/2012, 02/26/2018    Conditions to be addressed/monitored:  Hypertension, Hyperlipidemia, Hypothyroidism and Osteopenia  Care Plan : General Pharmacy (Adult)  Updates made by Germaine Pomfret, RPH since 07/14/2020 12:00 AM    Problem: Hypertension, Hyperlipidemia, Hypothyroidism and Osteopenia   Priority: High    Long-Range Goal: Patient-Specific Goal   Start Date: 07/14/2020  Expected End Date: 01/14/2021  This Visit's Progress: On track  Priority: High  Note:   Current Barriers:  . No barriers noted  Pharmacist Clinical Goal(s):  Marland Kitchen Patient will maintain control of blood pressure as evidenced by BP less than 140/90  through collaboration with PharmD and provider.   Interventions: . 1:1 collaboration with Steele Sizer, MD regarding development and update of comprehensive plan of care as evidenced by provider attestation and co-signature . Inter-disciplinary care team collaboration (see longitudinal plan of care) . Comprehensive medication review performed; medication list updated in electronic medical record  Hypertension (BP goal <140/90) -Controlled -Current treatment: . Losartan 25 mg 1/2 tablet daily  -Medications previously tried: NA  -Current home readings: NA -Current dietary habits: Joined weight watchers, has lost 13 pounds since October.  -Current exercise habits: Patient walks when able to.   -Denies hypotensive/hypertensive symptoms -Educated on BP goals and benefits of medications for prevention of heart attack, stroke and kidney damage; Importance of home blood pressure monitoring; Proper BP monitoring  technique; -Counseled to monitor BP at home weekly, document, and provide log at future appointments -Recommended to continue current medication  Hyperlipidemia: (LDL  goal < 100) -Controlled -Current treatment: . Rosuvastatin 20 mg daily  -Current antiplatelet treatment: . Aspirin 81 mg daily   -Medications previously tried: NA  - Patient does report mild aches, but does not think it is directly related to her statin.  -Educated on Importance of limiting foods high in cholesterol; -Recommended to continue current medication  Osteopenia (Goal Maintain bone density and prevent fractures) -Controlled  -Patient is not a candidate for pharmacologic treatment -Current treatment  . Calciuim + Phosphorous + Vitamin D  -Medications previously tried: NA  -Recommend [phone] units of vitamin D daily. Recommend 1200 mg of calcium daily from dietary and supplemental sources. -Recommended to continue current medication  Hypothyroid (Goal: Maintain stable thyroid function ) -Controlled -Current treatment  . Levothyroxine 88 mcg daily  -Medications previously tried: NA  -Recommended to continue current medication  Obstructive Sleep Apnea (Goal: Maintain quality sleep) -Controlled -Current treatment  . Trazodone 50 mg 1/2 tablet nightly as needed  -Medications previously tried: NA - Patient reports taking trazodone infrequently, only a few times monthly.  - Her sleep quality has improved significantly with weight loss, she continues to be compliant with CPAP.  -Recommended to continue current medication  Patient Goals/Self-Care Activities . Patient will:  - check blood pressure weekly, document, and provide at future appointments  Follow Up Plan: Telephone follow up appointment with care management team member scheduled for: 01/12/2021 at 9:15 AM      Medication Assistance: None required.  Patient affirms current coverage meets needs.  Patient's preferred pharmacy is:  [id number] [address], [place] - [place] [address] Phone: [phone] Fax: [facility] [place], [address] [place] [address] Phone: [phone] Fax: [phone]  Uses pill box? Yes Pt endorses 100% compliance  We discussed: Current pharmacy is preferred with insurance plan and patient is satisfied with pharmacy services Patient decided to: Continue current medication management strategy  Care Plan and Follow Up Patient Decision:  Patient agrees to Care Plan and Follow-up.  Plan: Telephone follow up appointment with care management team member scheduled for:  01/12/2021 at [facility] [phone]

## 2020-07-14 NOTE — Patient Instructions (Signed)
Visit Information It was great speaking with you today!  Please let me know if you have any questions about our visit.  Goals Addressed            This Visit's Progress   . Track and Manage My Blood Pressure-Hypertension       Timeframe:  Long-Range Goal Priority:  High Start Date: 07/14/2020                            Expected End Date: 01/12/2021                      Follow Up Date 09/27/2020    - check blood pressure weekly    Why is this important?    You won't feel high blood pressure, but it can still hurt your blood vessels.   High blood pressure can cause heart or kidney problems. It can also cause a stroke.   Making lifestyle changes like losing a little weight or eating less salt will help.   Checking your blood pressure at home and at different times of the day can help to control blood pressure.   If the doctor prescribes medicine remember to take it the way the doctor ordered.   Call the office if you cannot afford the medicine or if there are questions about it.     Notes:        Patient Care Plan: General Pharmacy (Adult)    Problem Identified: Hypertension, Hyperlipidemia, Hypothyroidism and Osteopenia   Priority: High    Long-Range Goal: Patient-Specific Goal   Start Date: 07/14/2020  Expected End Date: 01/14/2021  This Visit's Progress: On track  Priority: High  Note:   Current Barriers:  . No barriers noted  Pharmacist Clinical Goal(s):  Marland Kitchen Patient will maintain control of blood pressure as evidenced by BP less than 140/90  through collaboration with PharmD and provider.   Interventions: . 1:1 collaboration with Steele Sizer, MD regarding development and update of comprehensive plan of care as evidenced by provider attestation and co-signature . Inter-disciplinary care team collaboration (see longitudinal plan of care) . Comprehensive medication review performed; medication list updated in electronic medical record  Hypertension (BP goal  <140/90) -Controlled -Current treatment: . Losartan 25 mg 1/2 tablet daily  -Medications previously tried: NA  -Current home readings: NA -Current dietary habits: Joined weight watchers, has lost 13 pounds since October.  -Current exercise habits: Patient walks when able to.   -Denies hypotensive/hypertensive symptoms -Educated on BP goals and benefits of medications for prevention of heart attack, stroke and kidney damage; Importance of home blood pressure monitoring; Proper BP monitoring technique; -Counseled to monitor BP at home weekly, document, and provide log at future appointments -Recommended to continue current medication  Hyperlipidemia: (LDL goal < 100) -Controlled -Current treatment: . Rosuvastatin 20 mg daily  -Current antiplatelet treatment: . Aspirin 81 mg daily   -Medications previously tried: NA  - Patient does report mild aches, but does not think it is directly related to her statin.  -Educated on Importance of limiting foods high in cholesterol; -Recommended to continue current medication  Osteopenia (Goal Maintain bone density and prevent fractures) -Controlled  -Patient is not a candidate for pharmacologic treatment -Current treatment  . Calciuim + Phosphorous + Vitamin D  -Medications previously tried: NA  -Recommend [phone] units of vitamin D daily. Recommend 1200 mg of calcium daily from dietary and supplemental sources. -Recommended to continue  current medication  Hypothyroid (Goal: Maintain stable thyroid function ) -Controlled -Current treatment  . Levothyroxine 88 mcg daily  -Medications previously tried: NA  -Recommended to continue current medication  Obstructive Sleep Apnea (Goal: Maintain quality sleep) -Controlled -Current treatment  . Trazodone 50 mg 1/2 tablet nightly as needed  -Medications previously tried: NA - Patient reports taking trazodone infrequently, only a few times monthly.  - Her sleep quality has improved  significantly with weight loss, she continues to be compliant with CPAP.  -Recommended to continue current medication  Patient Goals/Self-Care Activities . Patient will:  - check blood pressure weekly, document, and provide at future appointments  Follow Up Plan: Telephone follow up appointment with care management team member scheduled for: 01/12/2021 at 9:15 AM      Patient agreed to services and verbal consent obtained.   The patient verbalized understanding of instructions, educational materials, and care plan provided today and declined offer to receive copy of patient instructions, educational materials, and care plan.   [facility] [phone]

## 2020-07-28 ENCOUNTER — Other Ambulatory Visit: Payer: Self-pay

## 2020-07-28 ENCOUNTER — Ambulatory Visit (INDEPENDENT_AMBULATORY_CARE_PROVIDER_SITE_OTHER): Payer: Medicare HMO | Admitting: Obstetrics and Gynecology

## 2020-07-28 ENCOUNTER — Encounter: Payer: Self-pay | Admitting: Obstetrics and Gynecology

## 2020-07-28 VITALS — BP 120/70 | Ht 65.0 in | Wt 164.2 lb

## 2020-07-28 DIAGNOSIS — L900 Lichen sclerosus et atrophicus: Secondary | ICD-10-CM

## 2020-07-28 DIAGNOSIS — M85852 Other specified disorders of bone density and structure, left thigh: Secondary | ICD-10-CM | POA: Diagnosis not present

## 2020-07-28 MED ORDER — HYDROCORTISONE 1 % EX CREA: 1.0000 "application " | TOPICAL_CREAM | Freq: Every day | CUTANEOUS | 0 refills | Status: DC

## 2020-07-28 NOTE — Patient Instructions (Addendum)
[employer] for Calcium and Vitamin D  Age (yr) Calcium Recommended Dietary Allowance (mg/day) Vitamin D Recommended Dietary Allowance (international units/day)  9-18 1,300 600  19-50 1,000 600  51-70 1,200 600  71 and older 1,200 800  Data from [employer]. Dietary reference intakes: calcium, vitamin D. [place], [place]: [employer]; 2011.     General vulvovaginal hygiene measures Keep vulva clean, dry, and well aerated  0 Avoid sleeper pajamas. Nightgowns allow air to circulate.  0 Cotton underpants. Double-rinse underwear after washing to avoid residual irritants. Do not use fabric softeners for underwear and swimsuits.  0 Avoid tights, leotards, and leggings. Skirts and loose-fitting pants allow air to circulate.  0 Avoid sitting in wet swimsuits for long periods of time.  Daily warm bathing   0 Do not use bubble baths or perfumed soaps.  0 soak in clean water (no soap) for 10 to 15 minutes.  0 Use soap to wash regions other than the genital area just before getting out of the tub. Limit use of any soap on genital areas.  0 Rinse the genital area well and gently pat dry.  0 A hair dryer on the cool setting may be helpful to assist with drying the genital region.  0 If the vulvar area is tender or swollen, cool compresses may relieve the discomfort. Emollients may help protect skin.  Review toilet hygiene    1 Sit with knees apart to reduce reflux of urine into the vagina.    0 Emphasize wiping front to back after bowel movements.  0 Wet wipes can be used instead of toilet paper for wiping as long as they don't cause a "stinging" sensation.     Exercising to Stay Healthy To become healthy and stay healthy, it is recommended that you do moderate-intensity and vigorous-intensity exercise. You can tell that you are exercising at a moderate intensity if your heart starts beating faster and you start breathing  faster but can still hold a conversation. You can tell that you are exercising at a vigorous intensity if you are breathing much harder and faster and cannot hold a conversation while exercising. Exercising regularly is important. It has many health benefits, such as:  Improving overall fitness, flexibility, and endurance.  Increasing bone density.  Helping with weight control.  Decreasing body fat.  Increasing muscle strength.  Reducing stress and tension.  Improving overall health. How often should I exercise? Choose an activity that you enjoy, and set realistic goals. Your health care provider can help you make an activity plan that works for you. Exercise regularly as told by your health care provider. This may include:  Doing strength training two times a week, such as: ? Lifting weights. ? Using resistance bands. ? Push-ups. ? Sit-ups. ? Yoga.  Doing a certain intensity of exercise for a given amount of time. Choose from these options: ? A total of 150 minutes of moderate-intensity exercise every week. ? A total of 75 minutes of vigorous-intensity exercise every week. ? A mix of moderate-intensity and vigorous-intensity exercise every week. Children, pregnant women, people who have not exercised regularly, people who are overweight, and older adults may need to talk with a health care provider about what activities are safe to do. If you have a medical condition, be sure to talk with your health care provider before you start a new exercise program. What are some exercise ideas? Moderate-intensity exercise ideas include:  Walking 1 mile (1.6  km) in about 15 minutes.  Biking.  Hiking.  Golfing.  Dancing.  Water aerobics. Vigorous-intensity exercise ideas include:  Walking 4.5 miles (7.2 km) or more in about 1 hour.  Jogging or running 5 miles (8 km) in about 1 hour.  Biking 10 miles (16.1 km) or more in about 1 hour.  Lap swimming.  Roller-skating or  in-line skating.  Cross-country skiing.  Vigorous competitive sports, such as football, basketball, and soccer.  Jumping rope.  Aerobic dancing.   What are some everyday activities that can help me to get exercise?  [place] work, such as: ? Pushing a Conservation officer, nature. ? Raking and bagging leaves.  Washing your car.  Pushing a stroller.  Shoveling snow.  Gardening.  Washing windows or floors. How can I be more active in my day-to-day activities?  Use stairs instead of an elevator.  Take a walk during your lunch break.  If you drive, park your car farther away from your work or school.  If you take public transportation, get off one stop early and walk the rest of the way.  Stand up or walk around during all of your indoor phone calls.  Get up, stretch, and walk around every 30 minutes throughout the day.  Enjoy exercise with a friend. Support to continue exercising will help you keep a regular routine of activity. What guidelines can I follow while exercising?  Before you start a new exercise program, talk with your health care provider.  Do not exercise so much that you hurt yourself, feel dizzy, or get very short of breath.  Wear comfortable clothes and wear shoes with good support.  Drink plenty of water while you exercise to prevent dehydration or heat stroke.  Work out until your breathing and your heartbeat get faster. Where to find more information  U.S. Department of Health and Human Services: BondedCompany.at  Centers for Disease Control and Prevention (CDC): http://www.wolf.info/ Summary  Exercising regularly is important. It will improve your overall fitness, flexibility, and endurance.  Regular exercise also will improve your overall health. It can help you control your weight, reduce stress, and improve your bone density.  Do not exercise so much that you hurt yourself, feel dizzy, or get very short of breath.  Before you start a new exercise program, talk with your  health care provider. This information is not intended to replace advice given to you by your health care provider. Make sure you discuss any questions you have with your health care provider. Document Revised: 03/30/2017 Document Reviewed: 03/08/2017 Elsevier Patient Education  2021 [place].   Budget-Friendly Healthy Eating There are many ways to save money at the grocery store and continue to eat healthy. You can be successful if you:  Plan meals according to your budget.  Make a grocery list and only purchase food according to your grocery list.  Prepare food yourself at home. What are tips for following this plan? Reading food labels  Compare food labels between brand name foods and the store brand. Often the nutritional value is the same, but the store brand is lower cost.  Look for products that do not have added sugar, fat, or salt (sodium). These often cost the same but are healthier for you. Products may be labeled as: ? Sugar-free. ? Nonfat. ? Low-fat. ? Sodium-free. ? Low-sodium.  Look for lean ground beef labeled as at least 92% lean and 8% fat. Shopping  Buy only the items on your grocery list and go only  to the areas of the store that have the items on your list.  Use coupons only for foods and brands you normally buy. Avoid buying items you wouldn't normally buy simply because they are on sale.  Check online and in newspapers for weekly deals.  Buy healthy items from the bulk bins when available, such as herbs, spices, flour, pasta, nuts, and dried fruit.  Buy fruits and vegetables that are in season. Prices are usually lower on in-season produce.  Look at the unit price on the price tag. Use it to compare different brands and sizes to find out which item is the best deal.  Choose healthy items that are often low-cost, such as carrots, potatoes, apples, bananas, and oranges. Dried or canned beans are a low-cost protein source.  Buy in bulk and freeze  extra food. Items you can buy in bulk include meats, fish, poultry, frozen fruits, and frozen vegetables.  Avoid buying "ready-to-eat" foods, such as pre-cut fruits and vegetables and pre-made salads.  If possible, shop around to discover where you can find the best prices. Consider other retailers such as dollar stores, larger [employer], local fruit and vegetable stands, and farmers markets.  Do not shop when you are hungry. If you shop while hungry, it may be hard to stick to your list and budget.  Resist impulse buying. Use your grocery list as your official plan for the week.  Buy a variety of vegetables and fruits by purchasing fresh, frozen, and canned items.  Look at the top and bottom shelves for deals. Foods at eye level (eye level of an adult or child) are usually more expensive.  Be efficient with your time when shopping. The more time you spend at the store, the more money you are likely to spend.  To save money when choosing more expensive foods like meats and dairy: ? Choose cheaper cuts of meat, such as bone-in chicken thighs and drumsticks instead of skinless and boneless chicken. When you are ready to prepare the chicken, you can remove the skin yourself to make it healthier. ? Choose lean meats like chicken or [place] instead of beef. ? Choose canned seafood, such as tuna, salmon, or sardines. ? Buy eggs as a low-cost source of protein. ? Buy dried beans and peas, such as lentils, split peas, or kidney beans instead of meats. Dried beans and peas are a good alternative source of protein. ? Buy the larger tubs of yogurt instead of individual-sized containers.  Choose water instead of sodas and other sweetened beverages.  Avoid buying chips, cookies, and other "junk food." These items are usually expensive and not healthy.   Cooking  Make extra food and freeze the extras in meal-sized containers or in individual portions for fast meals and snacks.  Pre-cook on  days when you have extra time to prepare meals in advance. You can keep these meals in the fridge or freezer and reheat for a quick meal.  When you come home from the grocery store, wash, peel, and cut fruits and vegetables so they are ready to use and eat. This will help reduce food waste. Meal planning  Do not eat out or get fast food. Prepare food at home.  Make a grocery list and make sure to bring it with you to the store. If you have a smart phone, you could use your phone to create your shopping list.  Plan meals and snacks according to a grocery list and budget you create.  Use  leftovers in your meal plan for the week.  Look for recipes where you can cook once and make enough food for two meals.  Prepare budget-friendly types of meals like stews, casseroles, and stir-fry dishes.  Try some meatless meals or try "no cook" meals like salads.  Make sure that half your plate is filled with fruits or vegetables. Choose from fresh, frozen, or canned fruits and vegetables. If eating canned, remember to rinse them before eating. This will remove any excess salt added for packaging. Summary  Eating healthy on a budget is possible if you plan your meals according to your budget, purchase according to your budget and grocery list, and prepare food yourself.  Tips for buying more food on a limited budget include buying generic brands, using coupons only for foods you normally buy, and buying healthy items from the bulk bins when available.  Tips for buying cheaper food to replace expensive food include choosing cheaper, lean cuts of meat, and buying dried beans and peas. This information is not intended to replace advice given to you by your health care provider. Make sure you discuss any questions you have with your health care provider. Document Revised: 01/29/2020 Document Reviewed: 01/29/2020 Elsevier Patient Education  [place] protect organs, store  calcium, anchor muscles, and support the whole body. Keeping your bones strong is important, especially as you get older. You can take actions to help keep your bones strong and healthy. Why is keeping my bones healthy important? Keeping your bones healthy is important because your body constantly replaces bone cells. Cells get old, and new cells take their place. As we age, we lose bone cells because the body may not be able to make enough new cells to replace the old cells. The amount of bone cells and bone tissue you have is referred to as bone mass. The higher your bone mass, the stronger your bones. The aging process leads to an overall loss of bone mass in the body, which can increase the likelihood of:  Joint pain and stiffness.  Broken bones.  A condition in which the bones become weak and brittle (osteoporosis). A large decline in bone mass occurs in older adults. In women, it occurs about the time of menopause.   What actions can I take to keep my bones healthy? Good health habits are important for maintaining healthy bones. This includes eating nutritious foods and exercising regularly. To have healthy bones, you need to get enough of the right minerals and vitamins. Most nutrition experts recommend getting these nutrients from the foods that you eat. In some cases, taking supplements may also be recommended. Doing certain types of exercise is also important for bone health. What are the nutritional recommendations for healthy bones? Eating a well-balanced diet with plenty of calcium and vitamin D will help to protect your bones. Nutritional recommendations vary from person to person. Ask your health care provider what is healthy for you. Here are some general guidelines. Get enough calcium Calcium is the most important (essential) mineral for bone health. Most people can get enough calcium from their diet, but supplements may be recommended for people who are at risk for osteoporosis.  Good sources of calcium include:  Dairy products, such as low-fat or nonfat milk, cheese, and yogurt.  Dark green leafy vegetables, such as bok choy and broccoli.  Calcium-fortified foods, such as orange juice, cereal, bread, soy beverages, and tofu products.  Nuts, such as almonds. Follow  these recommended amounts for daily calcium intake:  Children, age 91-3: 700 mg.  Children, age 76-8: 1,000 mg.  Children, age 917-13: 1,300 mg.  Teens, age 52-18: 1,300 mg.  Adults, age 69-50: 1,000 mg.  Adults, age 56-70: ? Men: 1,000 mg. ? Women: 1,200 mg.  Adults, age 83 or older: 1,200 mg.  Pregnant and breastfeeding females: ? Teens: 1,300 mg. ? Adults: 1,000 mg. Get enough vitamin D Vitamin D is the most essential vitamin for bone health. It helps the body absorb calcium. Sunlight stimulates the skin to make vitamin D, so be sure to get enough sunlight. If you live in a cold climate or you do not get outside often, your health care provider may recommend that you take vitamin D supplements. Good sources of vitamin D in your diet include:  Egg yolks.  Saltwater fish.  Milk and cereal fortified with vitamin D. Follow these recommended amounts for daily vitamin D intake:  Children and teens, age 91-18: 600 international units.  Adults, age 36 or younger: 400-800 international units.  Adults, age 3 or older: 800-1,000 international units. Get other important nutrients Other nutrients that are important for bone health include:  Phosphorus. This mineral is found in meat, poultry, dairy foods, nuts, and legumes. The recommended daily intake for adult men and adult women is 700 mg.  Magnesium. This mineral is found in seeds, nuts, dark green vegetables, and legumes. The recommended daily intake for adult men is 400-420 mg. For adult women, it is 310-320 mg.  Vitamin K. This vitamin is found in green leafy vegetables. The recommended daily intake is 120 mg for adult men and 90 mg for  adult women.   What type of physical activity is best for building and maintaining healthy bones? Weight-bearing and strength-building activities are important for building and maintaining healthy bones. Weight-bearing activities cause muscles and bones to work against gravity. Strength-building activities increase the strength of the muscles that support bones. Weight-bearing and muscle-building activities include:  Walking and hiking.  Jogging and running.  Dancing.  Gym exercises.  Lifting weights.  Tennis and racquetball.  Climbing stairs.  Aerobics. Adults should get at least 30 minutes of moderate physical activity on most days. Children should get at least 60 minutes of moderate physical activity on most days. Ask your health care provider what type of exercise is best for you.   How can I find out if my bone mass is low? Bone mass can be measured with an X-ray test called a bone mineral density (BMD) test. This test is recommended for all women who are age 54 or older. It may also be recommended for:  Men who are age 76 or older.  People who are at risk for osteoporosis because of: ? Having bones that break easily. ? Having a long-term disease that weakens bones, such as kidney disease or rheumatoid arthritis. ? Having menopause earlier than normal. ? Taking medicine that weakens bones, such as steroids, thyroid hormones, or hormone treatment for breast cancer or prostate cancer. ? Smoking. ? Drinking three or more alcoholic drinks a day. If you find that you have a low bone mass, you may be able to prevent osteoporosis or further bone loss by changing your diet and lifestyle. Where can I find more information? For more information, check out the following websites:  [place]: AviationTales.fr  [employer] of Health: www.bones.SouthExposed.es  International Osteoporosis Foundation: Administrator.iofbonehealth.org Summary  The aging process leads to  an overall loss  of bone mass in the body, which can increase the likelihood of broken bones and osteoporosis.  Eating a well-balanced diet with plenty of calcium and vitamin D will help to protect your bones.  Weight-bearing and strength-building activities are also important for building and maintaining strong bones.  Bone mass can be measured with an X-ray test called a bone mineral density (BMD) test. This information is not intended to replace advice given to you by your health care provider. Make sure you discuss any questions you have with your health care provider. Document Revised: 05/14/2017 Document Reviewed: 05/14/2017 Elsevier Patient Education  2021 [employer].

## 2020-07-28 NOTE — Progress Notes (Signed)
Gynecology Annual Exam  PCP: Steele Sizer, MD  Chief Complaint:  Chief Complaint  Patient presents with  . Gynecologic Exam    History of Present Illness: Patient is a 75 y.o. No obstetric history on file. presents for annual exam. The patient has no complaints today.   LMP: No LMP recorded. Patient is postmenopausal. She denies postmenopausal bleeding or spotting  The patient is not sexually active. She has dyspareunia.  Postcoital Bleeding: no   The patient does perform self breast exams.  There is no notable family history of breast or ovarian cancer in her family.  The patient has regular exercise: walks and goes to the gym daily.  The patient denies current symptoms of depression.   Review of Systems: Review of Systems  Constitutional: Negative for chills, fever, malaise/fatigue and weight loss.  HENT: Negative for congestion, hearing loss and sinus pain.   Eyes: Negative for blurred vision and double vision.  Respiratory: Negative for cough, sputum production, shortness of breath and wheezing.   Cardiovascular: Negative for chest pain, palpitations, orthopnea and leg swelling.  Gastrointestinal: Negative for abdominal pain, constipation, diarrhea, nausea and vomiting.  Genitourinary: Positive for frequency. Negative for dysuria, flank pain, hematuria and urgency.  Musculoskeletal: Negative for back pain, falls and joint pain.  Skin: Negative for itching and rash.  Neurological: Negative for dizziness and headaches.  Psychiatric/Behavioral: Negative for depression, substance abuse and suicidal ideas. The patient is not nervous/anxious.     Past Medical History:  Past Medical History:  Diagnosis Date  . Hyperlipidemia   . Hypertension   . Thyroid disease     Past Surgical History:  History reviewed. No pertinent surgical history.  Gynecologic History:  No LMP recorded. Patient is postmenopausal. Last Pap: Results were: 09/28/2017 per patient, denies a  history of abnormal pap smears Last mammogram: 09/01/2019  Results were: BI-RAD I  Obstetric History: No obstetric history on file.  Family History:  Family History  Problem Relation Age of Onset  . Cancer Mother   . Diabetes Father   . Stroke Father   . Breast cancer Neg Hx     Social History:  Social History   Socioeconomic History  . Marital status: Married    Spouse name: Not on file  . Number of children: 0  . Years of education: Not on file  . Highest education level: High school graduate  Occupational History  . Occupation: Retired  . Occupation: Armed forces operational officer  Tobacco Use  . Smoking status: Former Smoker    Packs/day: 0.25    Years: 5.00    Pack years: 1.25    Types: Cigarettes    Start date: 05/01/1972    Quit date: 05/01/1977    Years since quitting: 43.2  . Smokeless tobacco: Never Used  Vaping Use  . Vaping Use: Never used  Substance and Sexual Activity  . Alcohol use: No  . Drug use: No  . Sexual activity: Yes    Partners: Male  Other Topics Concern  . Not on file  Social History Narrative   Second marriage, widow from first marriage at age 56 and her current husband had two teenagers who she help raise    Social Determinants of Health   Financial Resource Strain: Low Risk   . Difficulty of Paying Living Expenses: Not hard at all  Food Insecurity: No Food Insecurity  . Worried About Charity fundraiser in the Last Year: Never true  . Ran Out of Food  in the Last Year: Never true  Transportation Needs: No Transportation Needs  . Lack of Transportation (Medical): No  . Lack of Transportation (Non-Medical): No  Physical Activity: Insufficiently Active  . Days of Exercise per Week: 3 days  . Minutes of Exercise per Session: 40 min  Stress: No Stress Concern Present  . Feeling of Stress : Only a little  Social Connections: Socially Integrated  . Frequency of Communication with Friends and Family: More than three times a week  . Frequency of Social  Gatherings with Friends and Family: More than three times a week  . Attends Religious Services: More than 4 times per year  . Active Member of Clubs or Organizations: Yes  . Attends Archivist Meetings: More than 4 times per year  . Marital Status: Married  Human resources officer Violence: Not At Risk  . Fear of Current or Ex-Partner: No  . Emotionally Abused: No  . Physically Abused: No  . Sexually Abused: No    Allergies:  Allergies  Allergen Reactions  . Codeine Nausea And Vomiting    Medications: Prior to Admission medications   Medication Sig Start Date End Date Taking? Authorizing Provider  aspirin 81 MG tablet Take 1 tablet by mouth daily.   Yes Keith Rake Asad A, MD  azelastine (ASTELIN) 0.1 % nasal spray PLACE 2 SPRAYS INTO BOTH NOSTRILS 2 (TWO) TIMES DAILY. USE IN EACH NOSTRIL AS DIRECTED 07/05/20  Yes Sowles, Drue Stager, MD  Calcium-Phosphorus-Vitamin D 100-50-100 MG-MG-UNIT CHEW Chew 1 tablet by mouth.   Yes Roselee Nova, MD  co-enzyme Q-10 30 MG capsule Take 30 mg by mouth 3 (three) times daily.   Yes [provider]  cyanocobalamin 100 MCG tablet Take 1,000 mcg by mouth daily.    Yes [provider]  gabapentin (NEURONTIN) 100 MG capsule Take 1-2 capsules (100-200 mg total) by mouth at bedtime. 02/05/20  Yes Sowles, Drue Stager, MD  glucosamine-chondroitin 500-400 MG tablet Take 1 tablet by mouth 3 (three) times daily.   Yes [provider]  hydrocortisone cream 1 % Apply 1 application topically daily. 07/28/20  Yes Tanaia Hawkey, Stefanie Libel, MD  levothyroxine (SYNTHROID) 88 MCG tablet Take 1 tablet (88 mcg total) by mouth daily before breakfast. 05/14/20  Yes Sowles, Drue Stager, MD  losartan (COZAAR) 25 MG tablet TAKE 1/2 TABLET DAILY 05/14/20  Yes Sowles, Drue Stager, MD  meloxicam (MOBIC) 7.5 MG tablet Take 1 tablet (7.5 mg total) by mouth daily. 02/05/20  Yes Sowles, Drue Stager, MD  MULTIPLE VITAMIN PO Take by mouth daily.   Yes Roselee Nova, MD   Multiple Vitamins-Minerals (HAIR SKIN AND NAILS FORMULA PO) Take by mouth.    Yes [provider]  Omega-3 Fatty Acids (FISH OIL BURP-LESS) 1200 MG CAPS Take 1 capsule by mouth.   Yes Keith Rake Asad A, MD  rosuvastatin (CRESTOR) 20 MG tablet Take 1 tablet (20 mg total) by mouth daily. 05/14/20  Yes Sowles, Drue Stager, MD  traZODone (DESYREL) 50 MG tablet Take 25 mg by mouth at bedtime as needed for sleep.   Yes [provider]  fluticasone (FLONASE) 50 MCG/ACT nasal spray Place 1 spray into both nostrils daily. Patient not taking: Reported on 07/28/2020 01/22/17   Hubbard Hartshorn, FNP  Turmeric 500 MG TABS Take 1 tablet by mouth daily. Patient not taking: Reported on 07/28/2020    [provider]    Physical Exam Vitals: Blood pressure 120/70, height 5\' 5"  (1.651 m), weight 164 lb 3.2 oz (  74.5 kg).  Physical Exam Constitutional:      Appearance: She is well-developed.  Genitourinary:     Genitourinary Comments: External: Normal appearing vulva. No lesions noted.  Bimanual examination: Uterus midline, non-tender, normal in size, shape and contour.  No CMT. No adnexal masses. No adnexal tenderness. Pelvis not fixed.  Breast Exam: breast equal without skin changes, nipple discharge, breast lump or enlarged lymph nodes   HENT:     Head: Normocephalic and atraumatic.  Neck:     Thyroid: No thyromegaly.  Cardiovascular:     Rate and Rhythm: Normal rate and regular rhythm.     Heart sounds: Normal heart sounds.  Pulmonary:     Effort: Pulmonary effort is normal.     Breath sounds: Normal breath sounds.  Abdominal:     General: Bowel sounds are normal. There is no distension.     Palpations: Abdomen is soft. There is no mass.  Musculoskeletal:     Cervical back: Neck supple.  Neurological:     Mental Status: She is alert and oriented to person, place, and time.  Skin:    General: Skin is warm and dry.  Psychiatric:        Behavior: Behavior normal.         Thought Content: Thought content normal.        Judgment: Judgment normal.  Vitals reviewed.      Female chaperone present for pelvic and breast  portions of the physical exam  Assessment: 75 y.o. No obstetric history on file. routine annual exam  Plan: Problem List Items Addressed This Visit   None   Visit Diagnoses    Lichen sclerosus et atrophicus    -  Primary   Relevant Medications   hydrocortisone cream 1 %      1) Mammogram - recommend yearly screening mammogram.  Mammogram Was ordered today  2) STI screening was offered and declined  3) ASCCP guidelines and rational discussed.  Patient opts for discontinuing since she is over 49 without a history of abnormal pap smears.   4) Colonoscopy -- reports cologuard 2 years ago   5) Routine healthcare maintenance including cholesterol, diabetes screening discussed managed by PCP  6) Osteoporosis screening - dexa scan ordered, history of osteoporosis.   7) Vulva discomfort- will trial OTC ointment and discontinue soap usage, if no improvement will trial steroid ointment. Follow up in 4 weeks.   Adrian Prows MD, [facility], [facility] 07/28/2020 3:21 PM

## 2020-08-04 NOTE — Progress Notes (Signed)
Name: Paige [name]   MRN: [id number]    DOB: [birth year]   Date:08/06/2020       Progress Note  Subjective  Chief Complaint  Follow up  HPI   Hyperlipidemia: she is now on Rosuvastatin in place of Pravastatin , she denies myalgia , LDL improved, she has been more physically active, also on weight watchers.   HTN: She is down on half losartan and bp is towards low end of normal, no chest pain or palpitation. She denies dizziness.   Hypothyroidism: she has been on levothyroxine for 88 mcg for many years. TSH has been at goal for a long time. No hair loss, change in appetite or dysphagia. No fatigue   OSA : diagnosed by Dr. Humphrey Rolls , she is complaint with CPAP and states pressure seems okay even though she has lost weight  Overweight: she joined weight Watchers in October 2021, she is down 17 lbs from the start, she is feeling well.   Right achilles tendinitis: seen by Dr.Hyatt back in May 2021 and was diagnosed with heel spur and also tendinitis, she had PT , taking meloxicam prn, walking her dog, doing well, discussed ice packs.   Left sciatic notch pain: doing well at this time.   Patient Active Problem List   Diagnosis Date Noted  . OSA (obstructive sleep apnea) 02/05/2020  . Essential hypertension 08/28/2018  . Osteopenia 08/28/2018  . Hyperlipidemia 10/27/2014  . Allergic rhinitis 10/27/2014  . Adult hypothyroidism 10/13/2014    No past surgical history on file.  Family History  Problem Relation Age of Onset  . Cancer Mother   . Diabetes Father   . Stroke Father   . Breast cancer Neg Hx     Social History   Tobacco Use  . Smoking status: Former Smoker    Packs/day: 0.25    Years: 5.00    Pack years: 1.25    Types: Cigarettes    Start date: 05/01/1972    Quit date: 05/01/1977    Years since quitting: 43.2  . Smokeless tobacco: Never Used  Substance Use Topics  . Alcohol use: No     Current Outpatient Medications:  .  aspirin 81 MG tablet, Take 1 tablet  by mouth daily., Disp: , Rfl:  .  azelastine (ASTELIN) 0.1 % nasal spray, PLACE 2 SPRAYS INTO BOTH NOSTRILS 2 (TWO) TIMES DAILY. USE IN EACH NOSTRIL AS DIRECTED, Disp: 90 mL, Rfl: 0 .  Calcium-Phosphorus-Vitamin D 100-50-100 MG-MG-UNIT CHEW, Chew 1 tablet by mouth., Disp: , Rfl:  .  co-enzyme Q-10 30 MG capsule, Take 30 mg by mouth 3 (three) times daily., Disp: , Rfl:  .  cyanocobalamin 100 MCG tablet, Take 1,000 mcg by mouth daily. , Disp: , Rfl:  .  gabapentin (NEURONTIN) 100 MG capsule, Take 1-2 capsules (100-200 mg total) by mouth at bedtime., Disp: 180 capsule, Rfl: 0 .  glucosamine-chondroitin 500-400 MG tablet, Take 1 tablet by mouth 3 (three) times daily., Disp: , Rfl:  .  hydrocortisone cream 1 %, Apply 1 application topically daily., Disp: 45 g, Rfl: 0 .  levothyroxine (SYNTHROID) 88 MCG tablet, Take 1 tablet (88 mcg total) by mouth daily before breakfast., Disp: 90 tablet, Rfl: 1 .  meloxicam (MOBIC) 7.5 MG tablet, Take 1 tablet (7.5 mg total) by mouth daily., Disp: 90 tablet, Rfl: 0 .  MULTIPLE VITAMIN PO, Take by mouth daily., Disp: , Rfl:  .  Multiple Vitamins-Minerals (HAIR SKIN AND NAILS FORMULA PO), Take by mouth. ,  Disp: , Rfl:  .  Omega-3 Fatty Acids (FISH OIL BURP-LESS) 1200 MG CAPS, Take 1 capsule by mouth., Disp: , Rfl:  .  rosuvastatin (CRESTOR) 20 MG tablet, Take 1 tablet (20 mg total) by mouth daily., Disp: 90 tablet, Rfl: 1 .  traZODone (DESYREL) 50 MG tablet, Take 25 mg by mouth at bedtime as needed for sleep., Disp: , Rfl:   Allergies  Allergen Reactions  . Codeine Nausea And Vomiting    I personally reviewed active problem list, medication list, allergies, family history, social history, health maintenance with the patient/caregiver today.   ROS  Constitutional: Negative for fever , positive for  weight change.  Respiratory: Negative for cough and shortness of breath.   Cardiovascular: Negative for chest pain or palpitations.  Gastrointestinal: Negative for  abdominal pain, no bowel changes.  Musculoskeletal: Negative for gait problem or joint swelling.  Skin: Negative for rash.  Neurological: Negative for dizziness or headache.  No other specific complaints in a complete review of systems (except as listed in HPI above).  Objective  Vitals:   08/06/20 0802  BP: 122/68  Pulse: 77  Resp: 16  Temp: 98.3 F (36.8 C)  TempSrc: Oral  SpO2: 99%  Weight: 162 lb (73.5 kg)  Height: 5\' 5"  (1.651 m)    Body mass index is 26.96 kg/m.  Physical Exam  Constitutional: Patient appears well-developed and well-nourished.  No distress.  HEENT: head atraumatic, normocephalic, pupils equal and reactive to light, neck supple Cardiovascular: Normal rate, regular rhythm and normal heart sounds.  No murmur heard. No BLE edema. Pulmonary/Chest: Effort normal and breath sounds normal. No respiratory distress. Abdominal: Soft.  There is no tenderness. Psychiatric: Patient has a normal mood and affect. behavior is normal. Judgment and thought content normal.   PHQ2/9: Depression screen [facility] 2/9 08/06/2020 05/26/2020 03/04/2020 02/05/2020 02/05/2020  Decreased Interest 0 1 0 0 0  Down, Depressed, Hopeless 0 1 1 1 1   PHQ - 2 Score 0 2 1 1 1   Altered sleeping - 0 1 1 -  Tired, decreased energy - 0 1 1 -  Change in appetite - 0 0 0 -  Feeling bad or failure about yourself  - 0 0 0 -  Trouble concentrating - 0 0 0 -  Moving slowly or fidgety/restless - 0 0 0 -  Suicidal thoughts - 0 0 0 -  PHQ-9 Score - 2 3 3  -  Difficult doing work/chores - Not difficult at all Not difficult at all - -  Some recent data might be hidden    phq 9 is negative  Fall Risk: Fall Risk  08/06/2020 05/26/2020 03/04/2020 02/05/2020 01/23/2020  Falls in the past year? 0 0 0 0 0  Comment - - - - -  Number falls in past yr: 0 0 0 0 0  Comment - - - - -  Injury with Fall? 0 0 0 0 0  Risk for fall due to : - - No Fall Risks - -  Follow up - - Falls prevention discussed Falls evaluation  completed Falls evaluation completed     Functional Status Survey: Is the patient deaf or have difficulty hearing?: Yes Does the patient have difficulty seeing, even when wearing glasses/contacts?: No Does the patient have difficulty concentrating, remembering, or making decisions?: No Does the patient have difficulty walking or climbing stairs?: No Does the patient have difficulty dressing or bathing?: No Does the patient have difficulty doing errands alone such as visiting a  doctor's office or shopping?: No    Assessment & Plan  1. Pure hypercholesterolemia  - Lipid panel  2. Adult hypothyroidism  - TSH  3. History of hypertension  - CBC with Differential/Platelet - COMPLETE METABOLIC PANEL WITH GFR  4. OSA (obstructive sleep apnea)  - CBC with Differential/Platelet  5. Seasonal allergic rhinitis due to other allergic trigger   6. Osteopenia of neck of left femur  Vitamin D   7. Other insomnia   8. Encounter for screening mammogram for breast cancer  - MM 3D SCREEN BREAST BILATERAL; Future  9. Colon cancer screening  - Cologuard

## 2020-08-06 ENCOUNTER — Encounter: Payer: Self-pay | Admitting: Family Medicine

## 2020-08-06 ENCOUNTER — Ambulatory Visit (INDEPENDENT_AMBULATORY_CARE_PROVIDER_SITE_OTHER): Payer: Medicare HMO | Admitting: Family Medicine

## 2020-08-06 ENCOUNTER — Other Ambulatory Visit: Payer: Self-pay

## 2020-08-06 VITALS — BP 122/68 | HR 77 | Temp 98.3°F | Resp 16 | Ht 65.0 in | Wt 162.0 lb

## 2020-08-06 DIAGNOSIS — Z1231 Encounter for screening mammogram for malignant neoplasm of breast: Secondary | ICD-10-CM

## 2020-08-06 DIAGNOSIS — Z1211 Encounter for screening for malignant neoplasm of colon: Secondary | ICD-10-CM

## 2020-08-06 DIAGNOSIS — M85852 Other specified disorders of bone density and structure, left thigh: Secondary | ICD-10-CM

## 2020-08-06 DIAGNOSIS — J3089 Other allergic rhinitis: Secondary | ICD-10-CM

## 2020-08-06 DIAGNOSIS — Z8679 Personal history of other diseases of the circulatory system: Secondary | ICD-10-CM

## 2020-08-06 DIAGNOSIS — E039 Hypothyroidism, unspecified: Secondary | ICD-10-CM

## 2020-08-06 DIAGNOSIS — G4709 Other insomnia: Secondary | ICD-10-CM | POA: Diagnosis not present

## 2020-08-06 DIAGNOSIS — E7800 Pure hypercholesterolemia, unspecified: Secondary | ICD-10-CM

## 2020-08-06 DIAGNOSIS — G4733 Obstructive sleep apnea (adult) (pediatric): Secondary | ICD-10-CM

## 2020-08-07 LAB — CBC WITH DIFFERENTIAL/PLATELET
Absolute Monocytes: 260 cells/uL (ref 200–950)
Basophils Absolute: 20 cells/uL (ref 0–200)
Basophils Relative: 0.4 %
Eosinophils Absolute: 69 cells/uL (ref 15–500)
Eosinophils Relative: 1.4 %
HCT: 43.5 % (ref 35.0–45.0)
Hemoglobin: 14.2 g/dL (ref 11.7–15.5)
Lymphs Abs: 1137 cells/uL (ref 850–3900)
MCH: 30 pg (ref 27.0–33.0)
MCHC: 32.6 g/dL (ref 32.0–36.0)
MCV: 92 fL (ref 80.0–100.0)
MPV: 10.4 fL (ref 7.5–12.5)
Monocytes Relative: 5.3 %
Neutro Abs: 3415 cells/uL (ref 1500–7800)
Neutrophils Relative %: 69.7 %
Platelets: 185 10*3/uL (ref 140–400)
RBC: 4.73 10*6/uL (ref 3.80–5.10)
RDW: 12.4 % (ref 11.0–15.0)
Total Lymphocyte: 23.2 %
WBC: 4.9 10*3/uL (ref 3.8–10.8)

## 2020-08-07 LAB — COMPLETE METABOLIC PANEL WITH GFR
AG Ratio: 1.6 (calc) (ref 1.0–2.5)
ALT: 14 U/L (ref 6–29)
AST: 17 U/L (ref 10–35)
Albumin: 4.2 g/dL (ref 3.6–5.1)
Alkaline phosphatase (APISO): 42 U/L (ref 37–153)
BUN: 21 mg/dL (ref 7–25)
CO2: 27 mmol/L (ref 20–32)
Calcium: 9.2 mg/dL (ref 8.6–10.4)
Chloride: 107 mmol/L (ref 98–110)
Creat: 0.87 mg/dL (ref 0.60–0.93)
GFR, Est African American: 77 mL/min/{1.73_m2} (ref >=60)
GFR, Est Non African American: 67 mL/min/{1.73_m2} (ref >=60)
Globulin: 2.7 g/dL (calc) (ref 1.9–3.7)
Glucose, Bld: 94 mg/dL (ref 65–99)
Potassium: 3.9 mmol/L (ref 3.5–5.3)
Sodium: 141 mmol/L (ref 135–146)
Total Bilirubin: 0.6 mg/dL (ref 0.2–1.2)
Total Protein: 6.9 g/dL (ref 6.1–8.1)

## 2020-08-07 LAB — LIPID PANEL
Cholesterol: 137 mg/dL (ref <200)
HDL: 50 mg/dL (ref >=50)
LDL Cholesterol (Calc): 71 mg/dL (calc)
Non-HDL Cholesterol (Calc): 87 mg/dL (calc) (ref <130)
Total CHOL/HDL Ratio: 2.7 (calc) (ref <5.0)
Triglycerides: 82 mg/dL (ref <150)

## 2020-08-07 LAB — VITAMIN D 25 HYDROXY (VIT D DEFICIENCY, FRACTURES): Vit D, 25-Hydroxy: 66 ng/mL (ref 30–100)

## 2020-08-07 LAB — TSH: TSH: 0.31 mIU/L — ABNORMAL LOW (ref 0.40–4.50)

## 2020-08-08 ENCOUNTER — Other Ambulatory Visit: Payer: Self-pay | Admitting: Family Medicine

## 2020-08-08 DIAGNOSIS — G4733 Obstructive sleep apnea (adult) (pediatric): Secondary | ICD-10-CM | POA: Diagnosis not present

## 2020-08-08 DIAGNOSIS — E039 Hypothyroidism, unspecified: Secondary | ICD-10-CM

## 2020-09-07 DIAGNOSIS — G4733 Obstructive sleep apnea (adult) (pediatric): Secondary | ICD-10-CM | POA: Diagnosis not present

## 2020-09-09 ENCOUNTER — Ambulatory Visit: Payer: Medicare HMO | Admitting: Obstetrics and Gynecology

## 2020-09-13 DIAGNOSIS — G4733 Obstructive sleep apnea (adult) (pediatric): Secondary | ICD-10-CM | POA: Diagnosis not present

## 2020-09-14 ENCOUNTER — Ambulatory Visit
Admission: RE | Admit: 2020-09-14 | Discharge: 2020-09-14 | Disposition: A | Discharge status: Home / Self Care | Payer: Medicare HMO | Source: Ambulatory Visit | Attending: Obstetrics and Gynecology | Admitting: Obstetrics and Gynecology

## 2020-09-14 ENCOUNTER — Ambulatory Visit
Admission: RE | Admit: 2020-09-14 | Discharge: 2020-09-14 | Disposition: A | Discharge status: Home / Self Care | Payer: Medicare HMO | Source: Ambulatory Visit | Attending: Family Medicine | Admitting: Family Medicine

## 2020-09-14 ENCOUNTER — Other Ambulatory Visit: Payer: Self-pay

## 2020-09-14 DIAGNOSIS — M85852 Other specified disorders of bone density and structure, left thigh: Secondary | ICD-10-CM

## 2020-09-14 DIAGNOSIS — Z1231 Encounter for screening mammogram for malignant neoplasm of breast: Secondary | ICD-10-CM | POA: Diagnosis not present

## 2020-09-14 DIAGNOSIS — Z780 Asymptomatic menopausal state: Secondary | ICD-10-CM | POA: Diagnosis not present

## 2020-09-29 DIAGNOSIS — Z1212 Encounter for screening for malignant neoplasm of rectum: Secondary | ICD-10-CM | POA: Diagnosis not present

## 2020-09-29 DIAGNOSIS — Z1211 Encounter for screening for malignant neoplasm of colon: Secondary | ICD-10-CM | POA: Diagnosis not present

## 2020-10-03 LAB — COLOGUARD: Cologuard: NEGATIVE

## 2020-10-03 LAB — EXTERNAL GENERIC LAB PROCEDURE: COLOGUARD: NEGATIVE

## 2020-10-04 DIAGNOSIS — E039 Hypothyroidism, unspecified: Secondary | ICD-10-CM | POA: Diagnosis not present

## 2020-10-05 ENCOUNTER — Telehealth: Payer: Self-pay

## 2020-10-05 LAB — TSH: TSH: 1.06 mIU/L (ref 0.40–4.50)

## 2020-10-05 NOTE — Telephone Encounter (Signed)
error 

## 2020-10-05 NOTE — Progress Notes (Signed)
Chronic Care Management Pharmacy Assistant   Name: Paige [name]  MRN: [id number] DOB: [birth year]  Reason for Encounter:Hypertension Disease State Call.   Recent office visits:  08/06/2020 Dr.Sowles MD stop Losartan 25 mg  Recent consult visits:  07/28/2020 Dr.Schuman MD (Obstetrics and Gynecology) - Started hydrocortisone cream 1 %  Hospital visits:  None in previous 6 months  Medications: Outpatient Encounter Medications as of 10/05/2020  Medication Sig  . aspirin 81 MG tablet Take 1 tablet by mouth daily.  Marland Kitchen azelastine (ASTELIN) 0.1 % nasal spray PLACE 2 SPRAYS INTO BOTH NOSTRILS 2 (TWO) TIMES DAILY. USE IN EACH NOSTRIL AS DIRECTED  . Calcium-Phosphorus-Vitamin D 100-50-100 MG-MG-UNIT CHEW Chew 1 tablet by mouth.  . co-enzyme Q-10 30 MG capsule Take 30 mg by mouth 3 (three) times daily.  . cyanocobalamin 100 MCG tablet Take 1,000 mcg by mouth daily.   Marland Kitchen gabapentin (NEURONTIN) 100 MG capsule Take 1-2 capsules (100-200 mg total) by mouth at bedtime.  Marland Kitchen glucosamine-chondroitin 500-400 MG tablet Take 1 tablet by mouth 3 (three) times daily.  . hydrocortisone cream 1 % Apply 1 application topically daily.  Marland Kitchen levothyroxine (SYNTHROID) 88 MCG tablet Take 1 tablet (88 mcg total) by mouth daily before breakfast.  . meloxicam (MOBIC) 7.5 MG tablet Take 1 tablet (7.5 mg total) by mouth daily.  . MULTIPLE VITAMIN PO Take by mouth daily.  . Multiple Vitamins-Minerals (HAIR SKIN AND NAILS FORMULA PO) Take by mouth.   . Omega-3 Fatty Acids (FISH OIL BURP-LESS) 1200 MG CAPS Take 1 capsule by mouth.  . rosuvastatin (CRESTOR) 20 MG tablet Take 1 tablet (20 mg total) by mouth daily.  . traZODone (DESYREL) 50 MG tablet Take 25 mg by mouth at bedtime as needed for sleep.   No facility-administered encounter medications on file as of 10/05/2020.   Star Rating Drugs: Rosuvastatin 20 mg last filled on 07/29/2020 for 90 day supply  Reviewed chart prior to disease state call. Spoke with  patient regarding BP  Recent Office Vitals: BP Readings from Last 3 Encounters:  08/06/20 122/68  07/28/20 120/70  04/20/20 126/66   Pulse Readings from Last 3 Encounters:  08/06/20 77  04/20/20 70  03/04/20 66    Wt Readings from Last 3 Encounters:  08/06/20 162 lb (73.5 kg)  07/28/20 164 lb 3.2 oz (74.5 kg)  05/26/20 170 lb (77.1 kg)     Kidney Function Lab Results  Component Value Date/Time   CREATININE 0.87 08/06/2020 08:41 AM   CREATININE 0.92 08/06/2019 08:28 AM   GFRNONAA 67 08/06/2020 08:41 AM   GFRAA 77 08/06/2020 08:41 AM    BMP Latest Ref Rng & Units 08/06/2020 08/06/2019 09/02/2018  Glucose 65 - 99 mg/dL 94 103(H) 100(H)  BUN 7 - 25 mg/dL 21 23 20   Creatinine 0.60 - 0.93 mg/dL 0.87 0.92 0.83  BUN/Creat Ratio 6 - 22 (calc) NOT APPLICABLE NOT APPLICABLE NOT APPLICABLE  Sodium 884 - 146 mmol/L 141 145 141  Potassium 3.5 - 5.3 mmol/L 3.9 4.3 4.0  Chloride 98 - 110 mmol/L 107 108 106  CO2 20 - 32 mmol/L 27 31 29   Calcium 8.6 - 10.4 mg/dL 9.2 9.4 9.3    . Current antihypertensive regimen:   None ID  Patient denies headaches but reports lightheadedness when she stood up quickly at the [employer] office. . How often are you checking your Blood Pressure? when feeling symptomatic . Current home BP readings:  o Patient reports her blood pressure ranges around 120's-130/71. Marland Kitchen  What recent interventions/DTPs have been made by any provider to improve Blood Pressure control since last CPP Visit:  o 08/06/2020 PCP stop Losartan 25 mg 1/2 tablet daily  . Any recent hospitalizations or ED visits since last visit with CPP? No . What diet changes have been made to improve Blood Pressure Control?  o Patient reports she has stop weight watchers recently but is still continuing to eat healthy  . What exercise is being done to improve your Blood Pressure Control?  o Patient states she walks her dog multiple times through the day and attends the gym.   Adherence Review: Is the  patient currently on ACE/ARB medication? No Does the patient have >5 day gap between last estimated fill dates? No  Anderson Malta Clinical Production designer, theatre/television/film [phone]

## 2020-10-06 ENCOUNTER — Ambulatory Visit (INDEPENDENT_AMBULATORY_CARE_PROVIDER_SITE_OTHER): Payer: Medicare HMO

## 2020-10-06 ENCOUNTER — Other Ambulatory Visit: Payer: Self-pay

## 2020-10-06 ENCOUNTER — Encounter: Payer: Self-pay | Admitting: Family Medicine

## 2020-10-06 DIAGNOSIS — G4733 Obstructive sleep apnea (adult) (pediatric): Secondary | ICD-10-CM

## 2020-10-06 NOTE — Progress Notes (Signed)
95 percentile pressure 6   95th percentile leak 6.5   apnea index 0.0 /hr  apnea-hypopnea index  0.0 /hr   total days used  >4 hr 89 days  total days used <4 hr 1 days  Total compliance 99 percent  She states she has lost 17 pounds, she also said she is not feeling refreshed as once was with no AHI at all advised to try without few nights to see how she does and she has a follow up with Dr Humphrey Rolls in July.

## 2020-10-07 ENCOUNTER — Other Ambulatory Visit: Payer: Self-pay | Admitting: Family Medicine

## 2020-10-07 MED ORDER — LOSARTAN POTASSIUM 25 MG PO TABS: 25.0000 mg | ORAL_TABLET | Freq: Every day | ORAL | 0 refills | Status: DC

## 2020-10-08 DIAGNOSIS — G4733 Obstructive sleep apnea (adult) (pediatric): Secondary | ICD-10-CM | POA: Diagnosis not present

## 2020-10-11 ENCOUNTER — Other Ambulatory Visit: Payer: Self-pay | Admitting: Family Medicine

## 2020-10-11 DIAGNOSIS — E7800 Pure hypercholesterolemia, unspecified: Secondary | ICD-10-CM

## 2020-10-11 DIAGNOSIS — E039 Hypothyroidism, unspecified: Secondary | ICD-10-CM

## 2020-11-07 DIAGNOSIS — G4733 Obstructive sleep apnea (adult) (pediatric): Secondary | ICD-10-CM | POA: Diagnosis not present

## 2020-11-09 ENCOUNTER — Ambulatory Visit: Payer: Medicare Other | Admitting: Internal Medicine

## 2020-11-11 NOTE — Telephone Encounter (Signed)
Called and discussed with patient

## 2020-11-22 ENCOUNTER — Telehealth: Payer: Self-pay

## 2020-11-22 NOTE — Progress Notes (Signed)
Chronic Care Management Pharmacy Assistant   Name: Paige [name]  MRN: [id number] DOB: 18-Jun-1945  Reason for Encounter: Hypertension Disease State Call   Recent office visits:  No recent office visits  Recent consult visits:  No recent consult visits  Hospital visits:  None in previous 6 months  Medications: Outpatient Encounter Medications as of 11/22/2020  Medication Sig   aspirin 81 MG tablet Take 1 tablet by mouth daily.   azelastine (ASTELIN) 0.1 % nasal spray PLACE 2 SPRAYS INTO BOTH NOSTRILS 2 (TWO) TIMES DAILY. USE IN EACH NOSTRIL AS DIRECTED   Calcium-Phosphorus-Vitamin D 100-50-100 MG-MG-UNIT CHEW Chew 1 tablet by mouth.   co-enzyme Q-10 30 MG capsule Take 30 mg by mouth 3 (three) times daily.   cyanocobalamin 100 MCG tablet Take 1,000 mcg by mouth daily.    gabapentin (NEURONTIN) 100 MG capsule Take 1-2 capsules (100-200 mg total) by mouth at bedtime.   glucosamine-chondroitin 500-400 MG tablet Take 1 tablet by mouth 3 (three) times daily.   hydrocortisone cream 1 % Apply 1 application topically daily.   levothyroxine (SYNTHROID) 88 MCG tablet TAKE 1 TABLET (88 MCG TOTAL) BY MOUTH DAILY BEFORE BREAKFAST.   losartan (COZAAR) 25 MG tablet Take 1 tablet (25 mg total) by mouth daily.   meloxicam (MOBIC) 7.5 MG tablet Take 1 tablet (7.5 mg total) by mouth daily.   MULTIPLE VITAMIN PO Take by mouth daily.   Multiple Vitamins-Minerals (HAIR SKIN AND NAILS FORMULA PO) Take by mouth.    Omega-3 Fatty Acids (FISH OIL BURP-LESS) 1200 MG CAPS Take 1 capsule by mouth.   rosuvastatin (CRESTOR) 20 MG tablet TAKE 1 TABLET EVERY DAY   traZODone (DESYREL) 50 MG tablet Take 25 mg by mouth at bedtime as needed for sleep.   No facility-administered encounter medications on file as of 11/22/2020.   Care Gaps: Zoster Vaccines- Shingrix COVID-19 Vaccine Booster 4  Star Rating Drugs: Rosuvastatin 20 mg ast filled on 10/12/2020 for a 90-Day supply no pharmacy indicated Losartan  25 mg last filled on 10/11/2020 for a 90-Day supply no pharmacy indicated    Recent Office Vitals: BP Readings from Last 3 Encounters:  08/06/20 122/68  07/28/20 120/70  04/20/20 126/66   Pulse Readings from Last 3 Encounters:  08/06/20 77  04/20/20 70  03/04/20 66    Wt Readings from Last 3 Encounters:  08/06/20 162 lb (73.5 kg)  07/28/20 164 lb 3.2 oz (74.5 kg)  05/26/20 170 lb (77.1 kg)     Kidney Function Lab Results  Component Value Date/Time   CREATININE 0.87 08/06/2020 08:41 AM   CREATININE 0.92 08/06/2019 08:28 AM   GFRNONAA 67 08/06/2020 08:41 AM   GFRAA 77 08/06/2020 08:41 AM    BMP Latest Ref Rng & Units 08/06/2020 08/06/2019 09/02/2018  Glucose 65 - 99 mg/dL 94 103(H) 100(H)  BUN 7 - 25 mg/dL '21 23 20  '$ Creatinine 0.60 - 0.93 mg/dL 0.87 0.92 0.83  BUN/Creat Ratio 6 - 22 (calc) NOT APPLICABLE NOT APPLICABLE NOT APPLICABLE  Sodium A999333 - 146 mmol/L 141 145 141  Potassium 3.5 - 5.3 mmol/L 3.9 4.3 4.0  Chloride 98 - 110 mmol/L 107 108 106  CO2 20 - 32 mmol/L '27 31 29  '$ Calcium 8.6 - 10.4 mg/dL 9.2 9.4 9.3   Reviewed chart prior to disease state call. Spoke with patient regarding BP  Current antihypertensive regimen:  Losartan 25 mg 1/2 tablet daily  How often are you checking your Blood Pressure? daily  Current  home BP readings: last reading she can't remember but she does remember that the top number was around 130.   What recent interventions/DTPs have been made by any provider to improve Blood Pressure control since last CPP Visit:  Patient stated that she had lost 20 lbs and had been taken off of medications but she noticed that her blood pressure was starting to be in the 140's so Dr. Ancil Boozer placed her back on Losartan 25 mg where she takes 1/2 tablet daily and she states that it has been controlling her blood pressure.  Any recent hospitalizations or ED visits since last visit with CPP? No  What diet changes have been made to improve Blood Pressure Control?   Patient stated she does Weight Watchers and she tries to watch her sodium and salt intake but she does use salt on her Watermelon and she does like to eat a small bag of corn chips every once in a while. She reports that she typically does not add extra salt to her food, but she will start limiting the salt she adds to her watermelon.  What exercise is being done to improve your Blood Pressure Control?  Patient reports that she walks with her dog 2-3 times daily.  Adherence Review: Is the patient currently on ACE/ARB medication? Yes Does the patient have >5 day gap between last estimated fill dates? No  01/12/2021 @ 0915 appointment scheduled with Junius Argyle, CPP 03/08/2021 AWV Scheduled  Lynann Bologna, CPA/CMA Clinical Pharmacist Assistant Phone: [phone]

## 2020-12-07 ENCOUNTER — Ambulatory Visit: Payer: Medicare HMO | Admitting: Internal Medicine

## 2020-12-07 ENCOUNTER — Encounter: Payer: Self-pay | Admitting: Internal Medicine

## 2020-12-07 ENCOUNTER — Other Ambulatory Visit: Payer: Self-pay

## 2020-12-07 VITALS — BP 130/72 | HR 61 | Temp 98.1°F | Resp 16 | Ht 65.0 in | Wt 162.6 lb

## 2020-12-07 DIAGNOSIS — G4733 Obstructive sleep apnea (adult) (pediatric): Secondary | ICD-10-CM

## 2020-12-07 DIAGNOSIS — G4761 Periodic limb movement disorder: Secondary | ICD-10-CM | POA: Diagnosis not present

## 2020-12-07 DIAGNOSIS — Z9989 Dependence on other enabling machines and devices: Secondary | ICD-10-CM | POA: Diagnosis not present

## 2020-12-07 DIAGNOSIS — Z7189 Other specified counseling: Secondary | ICD-10-CM

## 2020-12-07 NOTE — Progress Notes (Signed)
[facility] [place], [place] [id number]  Pulmonary Sleep Medicine   Office Visit Note  Patient Name: Paige [name] DOB: 1946-04-24 MRN [id number]  Date of Service: 12/07/2020  Complaints/HPI: OSA PLMS. Her OSA is under control. She falls asleep faster with the CPAP and stays asleep. She also did have PLMS noted and this could be also disturbing at times. She has not had iron studies and this may be of help to determine if she is iron levels are mildly low which could be contributing to her PLM's and restless legs which could also be disturbing her sleep at nighttime.  ROS  General: (-) fever, (-) chills, (-) night sweats, (-) weakness Skin: (-) rashes, (-) itching,. Eyes: (-) visual changes, (-) redness, (-) itching. Nose and Sinuses: (-) nasal stuffiness or itchiness, (-) postnasal drip, (-) nosebleeds, (-) sinus trouble. Mouth and Throat: (-) sore throat, (-) hoarseness. Neck: (-) swollen glands, (-) enlarged thyroid, (-) neck pain. Respiratory: - cough, (-) bloody sputum, - shortness of breath, - wheezing. Cardiovascular: - ankle swelling, (-) chest pain. Lymphatic: (-) lymph node enlargement. Neurologic: (-) numbness, (-) tingling. Psychiatric: (-) anxiety, (-) depression   Current Medication: Outpatient Encounter Medications as of 12/07/2020  Medication Sig   aspirin 81 MG tablet Take 1 tablet by mouth daily.   azelastine (ASTELIN) 0.1 % nasal spray PLACE 2 SPRAYS INTO BOTH NOSTRILS 2 (TWO) TIMES DAILY. USE IN EACH NOSTRIL AS DIRECTED   Calcium-Phosphorus-Vitamin D 100-50-100 MG-MG-UNIT CHEW Chew 1 tablet by mouth.   co-enzyme Q-10 30 MG capsule Take 30 mg by mouth 3 (three) times daily.   cyanocobalamin 100 MCG tablet Take 1,000 mcg by mouth daily.    gabapentin (NEURONTIN) 100 MG capsule Take 1-2 capsules (100-200 mg total) by mouth at bedtime.   glucosamine-chondroitin 500-400 MG tablet Take 1 tablet by mouth 3 (three) times daily.    hydrocortisone cream 1 % Apply 1 application topically daily.   levothyroxine (SYNTHROID) 88 MCG tablet TAKE 1 TABLET (88 MCG TOTAL) BY MOUTH DAILY BEFORE BREAKFAST.   losartan (COZAAR) 25 MG tablet Take 1 tablet (25 mg total) by mouth daily.   meloxicam (MOBIC) 7.5 MG tablet Take 1 tablet (7.5 mg total) by mouth daily.   MULTIPLE VITAMIN PO Take by mouth daily.   Multiple Vitamins-Minerals (HAIR SKIN AND NAILS FORMULA PO) Take by mouth.    Omega-3 Fatty Acids (FISH OIL BURP-LESS) 1200 MG CAPS Take 1 capsule by mouth.   rosuvastatin (CRESTOR) 20 MG tablet TAKE 1 TABLET EVERY DAY   traZODone (DESYREL) 50 MG tablet Take 25 mg by mouth at bedtime as needed for sleep.   No facility-administered encounter medications on file as of 12/07/2020.    Surgical History: History reviewed. No pertinent surgical history.  Medical History: Past Medical History:  Diagnosis Date   Hyperlipidemia    Hypertension    Thyroid disease     Family History: Family History  Problem Relation Age of Onset   Cancer Mother    Diabetes Father    Stroke Father    Breast cancer Neg Hx     Social History: Social History   Socioeconomic History   Marital status: Married    Spouse name: Not on file   Number of children: 0   Years of education: Not on file   Highest education level: High school graduate  Occupational History   Occupation: Retired   Occupation: Armed forces operational officer  Tobacco Use   Smoking status: Former  Packs/day: 0.25    Years: 5.00    Pack years: 1.25    Types: Cigarettes    Start date: 05/01/1972    Quit date: 05/01/1977    Years since quitting: 43.6   Smokeless tobacco: Never  Vaping Use   Vaping Use: Never used  Substance and Sexual Activity   Alcohol use: No   Drug use: No   Sexual activity: Yes    Partners: Male  Other Topics Concern   Not on file  Social History Narrative   Second marriage, widow from first marriage at age 26 and her current husband had two teenagers who she  help raise    Social Determinants of Health   Financial Resource Strain: Low Risk    Difficulty of Paying Living Expenses: Not hard at all  Food Insecurity: No Food Insecurity   Worried About Charity fundraiser in the Last Year: Never true   Arboriculturist in the Last Year: Never true  Transportation Needs: No Transportation Needs   Lack of Transportation (Medical): No   Lack of Transportation (Non-Medical): No  Physical Activity: Sufficiently Active   Days of Exercise per Week: 7 days   Minutes of Exercise per Session: 40 min  Stress: No Stress Concern Present   Feeling of Stress : Only a little  Social Connections: Engineer, building services of Communication with Friends and Family: More than three times a week   Frequency of Social Gatherings with Friends and Family: More than three times a week   Attends Religious Services: More than 4 times per year   Active Member of [employer] or Organizations: Yes   Attends Music therapist: More than 4 times per year   Marital Status: Married  Human resources officer Violence: Not At Risk   Fear of Current or Ex-Partner: No   Emotionally Abused: No   Physically Abused: No   Sexually Abused: No    Vital Signs: Blood pressure 130/72, pulse 61, temperature 98.1 F (36.7 C), resp. rate 16, height '5\' 5"'$  (1.651 m), weight 162 lb 9.6 oz (73.8 kg), SpO2 99 %.  Examination: General Appearance: The patient is well-developed, well-nourished, and in no distress. Skin: Gross inspection of skin unremarkable. Head: normocephalic, no gross deformities. Eyes: no gross deformities noted. ENT: ears appear grossly normal no exudates. Neck: Supple. No thyromegaly. No LAD. Respiratory: no rhonchi noted. Cardiovascular: Normal S1 and S2 without murmur or rub. Extremities: No cyanosis. pulses are equal. Neurologic: Alert and oriented. No involuntary movements.  LABS: Recent Results (from the past 2160 hour(s))  Cologuard     Status: None    Collection Time: 09/29/20 12:00 AM  Result Value Ref Range   Cologuard Negative Negative  TSH     Status: None   Collection Time: 10/04/20 10:16 AM  Result Value Ref Range   TSH 1.06 0.40 - 4.50 mIU/L    Radiology: DG Bone Density  Result Date: 09/14/2020 EXAM: DUAL X-RAY ABSORPTIOMETRY (DXA) FOR BONE MINERAL DENSITY IMPRESSION: Your patient Koral Orchard [name] completed a BMD test on 09/14/2020 using the Hyde (software version: 14.10) manufactured by [employer]. The following summarizes the results of our evaluation. Technologist: SCE PATIENT BIOGRAPHICAL: Name: Michelee, Ruffing Patient ID: [id number] Birth Date: [birth year] Height: 64.0 in. Gender: Female Exam Date: 09/14/2020 Weight: 163.5 lbs. Indications: Advanced Age, Caucasian, Height Loss, Hypothyroid, Postmenopausal Fractures: Treatments: Calcium, Vitamin D DENSITOMETRY RESULTS: Site      Region  Measured Date Measured Age WHO Classification Young Adult T-score BMD         %Change vs. Previous Significant Change (*) AP Spine L1-L4 (L3) 09/14/2020 72.8 Normal 0.4 1.240 g/cm2 0.6% - AP Spine L1-L4 (L3) 05/13/2018 70.5 Normal 0.4 1.232 g/cm2 - - DualFemur Neck Left 09/14/2020 72.8 Osteopenia -1.5 0.825 g/cm2 -2.8% - DualFemur Neck Left 05/13/2018 70.5 Osteopenia -1.4 0.849 g/cm2 - - DualFemur Total Mean 09/14/2020 72.8 Normal -0.9 0.900 g/cm2 -3.6% Yes DualFemur Total Mean 05/13/2018 70.5 Normal -0.6 0.934 g/cm2 - - ASSESSMENT: The BMD measured at Femur Neck Left is 0.825 g/cm2 with a T-score of -1.5. This patient is considered osteopenic according to [facility]) criteria. The scan quality is good. L3 was excluded due to degenerative changes. Compared with prior study, there has been no significant change in the spine. Compared with prior study, there has been a significant decrease in the total hip. World Pharmacologist [facility]) criteria for post-menopausal, Caucasian Women: Normal:                    T-score at or above -1 SD Osteopenia/low bone mass: T-score between -1 and -2.5 SD Osteoporosis:             T-score at or below -2.5 SD RECOMMENDATIONS: 1. All patients should optimize calcium and vitamin D intake. 2. Consider FDA-approved medical therapies in postmenopausal women and men aged 80 years and older, based on the following: a. A hip or vertebral(clinical or morphometric) fracture b. T-score < -2.5 at the femoral neck or spine after appropriate evaluation to exclude secondary causes c. Low bone mass (T-score between -1.0 and -2.5 at the femoral neck or spine) and a 10-year probability of a hip fracture > 3% or a 10-year probability of a major osteoporosis-related fracture > 20% based on the [place]-adapted WHO algorithm 3. Clinician judgment and/or patient preferences may indicate treatment for people with 10-year fracture probabilities above or below these levels FOLLOW-UP: People with diagnosed cases of osteoporosis or at high risk for fracture should have regular bone mineral density tests. For patients eligible for Medicare, routine testing is allowed once every 2 years. The testing frequency can be increased to one year for patients who have rapidly progressing disease, those who are receiving or discontinuing medical therapy to restore bone mass, or have additional risk factors. I have reviewed this report, and agree with the above findings. [facility] Dear Dr Gilman Schmidt, Your patient Elray Mcgregor [name] completed a FRAX assessment on 09/14/2020 using the Walker (analysis version: 14.10) manufactured by [employer]. The following summarizes the results of our evaluation. PATIENT BIOGRAPHICAL: Name: Ruey, Reigel Patient ID: [id number] Birth Date: 11-17-1945 Height:    64.0 in. Gender:     Female    [age]       Weight:    163.5 lbs. Ethnicity:  White                            Exam Date: 09/14/2020 FRAX* RESULTS:  (version: 3.5) 10-year  Probability of Fracture1 Major Osteoporotic Fracture2 Hip Fracture 10.7% 1.9% Population: [place] (Caucasian) Risk Factors: None Based on Femur (Left) Neck BMD 1 -The 10-year probability of fracture may be lower than reported if the patient has received treatment. 2 -Major Osteoporotic Fracture: Clinical Spine, Forearm, Hip or Shoulder *FRAX is a Materials engineer of the [employer] [facility] Metabolic Bone Disease, a World Pharmacologist (WHO) [place]. ASSESSMENT: The probability of a major osteoporotic fracture is 10.7% within the next ten years. The probability of a hip fracture is 1.9% within the next ten years. . Electronically Signed   By: Rolm Baptise M.D.   On: 09/14/2020 14:31   MM 3D SCREEN BREAST BILATERAL  Result Date: 09/14/2020 CLINICAL DATA:  Screening. EXAM: DIGITAL SCREENING BILATERAL MAMMOGRAM WITH TOMOSYNTHESIS AND CAD TECHNIQUE: Bilateral screening digital craniocaudal and mediolateral oblique mammograms were obtained. Bilateral screening digital breast tomosynthesis was performed. The images were evaluated with computer-aided detection. COMPARISON:  Previous exam(s). ACR Breast Density Category a: The breast tissue is almost entirely fatty. FINDINGS: There are no findings suspicious for malignancy. The images were evaluated with computer-aided detection. IMPRESSION: No mammographic evidence of malignancy. A result letter of this screening mammogram will be mailed directly to the patient. RECOMMENDATION: Screening mammogram in one year. (Code:[id number]-B-01Y) BI-RADS CATEGORY  1: Negative. Electronically Signed   By: Ammie Ferrier M.D.   On: 09/14/2020 15:34    No results found.  No results found.    Assessment and Plan: Patient Active Problem List   Diagnosis Date Noted   OSA (obstructive sleep apnea) 02/05/2020   Essential hypertension 08/28/2018   Osteopenia 08/28/2018   Hyperlipidemia 10/27/2014   Allergic rhinitis 10/27/2014   Adult  hypothyroidism 10/13/2014    1. OSA on CPAP Plan is to continue with PAP therapy as prescribed gaining some benefit from it.  2. PLMD (periodic limb movement disorder) May be due to overall iron stores deficiency or also could be due to the sleep disorder itself.  I would recommend first getting iron studies today she stated that she will talk to her primary care provider Dr. Ancil Boozer to get the studies done.  3. CPAP use counseling CPAP Counseling: had a lengthy discussion with the patient regarding the importance of PAP therapy in management of the sleep apnea. Patient appears to understand the risk factor reduction and also understands the risks associated with untreated sleep apnea. Patient will try to make a good faith effort to remain compliant with therapy. Also instructed the patient on proper cleaning of the device including the water must be changed daily if possible and use of distilled water is preferred. Patient understands that the machine should be regularly cleaned with appropriate recommended cleaning solutions that do not damage the PAP machine for example given white vinegar and water rinses. Other methods such as ozone treatment may not be as good as these simple methods to achieve cleaning.    General Counseling: I have discussed the findings of the evaluation and examination with [place].  I have also discussed any further diagnostic evaluation thatmay be needed or ordered today. Kitti verbalizes understanding of the findings of todays visit. We also reviewed her medications today and discussed drug interactions and side effects including but not limited excessive drowsiness and altered mental states. We also discussed that there is always a risk not just to her but also people around her. she has been encouraged to call the office with any questions or concerns that should arise related to todays visit.  No orders of the defined types were placed in this encounter.    Time  spent: 48  I have personally obtained a history, examined the patient, evaluated laboratory and imaging results, formulated the assessment and plan and placed orders.    Allyne Gee, MD [facility] Pulmonary and Critical Care Sleep  medicine

## 2020-12-07 NOTE — Patient Instructions (Signed)
Sleep Apnea Sleep apnea affects breathing during sleep. It causes breathing to stop for 10 seconds or more, or to become shallow. People with sleep apnea usually snoreloudly. It can also increase the risk of: Heart attack. Stroke. Being very overweight (obese). Diabetes. Heart failure. Irregular heartbeat. High blood pressure. The goal of treatment is to help you breathe normally again. What are the causes?  The most common cause of this condition is a collapsed or blocked airway. There are three kinds of sleep apnea: Obstructive sleep apnea. This is caused by a blocked or collapsed airway. Central sleep apnea. This happens when the brain does not send the right signals to the muscles that control breathing. Mixed sleep apnea. This is a combination of obstructive and central sleep apnea. What increases the risk? Being overweight. Smoking. Having a small airway. Being older. Being female. Drinking alcohol. Taking medicines to calm yourself (sedatives or tranquilizers). Having family members with the condition. Having a tongue or tonsils that are larger than normal. What are the signs or symptoms? Trouble staying asleep. Loud snoring. Headaches in the morning. Waking up gasping. Dry mouth or sore throat in the morning. Being sleepy or tired during the day. If you are sleepy or tired during the day, you may also: Not be able to focus your mind (concentrate). Forget things. Get angry a lot and have mood swings. Feel sad (depressed). Have changes in your personality. Have less interest in sex, if you are female. Be unable to have an erection, if you are female. How is this treated?  Sleeping on your side. Using a medicine to get rid of mucus in your nose (decongestant). Avoiding the use of alcohol, medicines to help you relax, or certain pain medicines (narcotics). Losing weight, if needed. Changing your diet. Quitting smoking. Using a machine to open your airway while you  sleep, such as: An oral appliance. This is a mouthpiece that shifts your lower jaw forward. A CPAP device. This device blows air through a mask when you breathe out (exhale). An EPAP device. This has valves that you put in each nostril. A BPAP device. This device blows air through a mask when you breathe in (inhale) and breathe out. Having surgery if other treatments do not work. Follow these instructions at home: Lifestyle Make changes that your doctor recommends. Eat a healthy diet. Lose weight if needed. Avoid alcohol, medicines to help you relax, and some pain medicines. Do not smoke or use any products that contain nicotine or tobacco. If you need help quitting, ask your doctor. General instructions Take over-the-counter and prescription medicines only as told by your doctor. If you were given a machine to use while you sleep, use it only as told by your doctor. If you are having surgery, make sure to tell your doctor you have sleep apnea. You may need to bring your device with you. Keep all follow-up visits. Contact a doctor if: The machine that you were given to use during sleep bothers you or does not seem to be working. You do not get better. You get worse. Get help right away if: Your chest hurts. You have trouble breathing in enough air. You have an uncomfortable feeling in your back, arms, or stomach. You have trouble talking. One side of your body feels weak. A part of your face is hanging down. These symptoms may be an emergency. Get help right away. Call your local emergency services (911 in the [place] Do not wait to see if the symptoms   will go away. Do not drive yourself to the hospital. Summary This condition affects breathing during sleep. The most common cause is a collapsed or blocked airway. The goal of treatment is to help you breathe normally while you sleep. This information is not intended to replace advice given to you by your health care provider. Make  sure you discuss any questions you have with your healthcare provider. Document Revised: 03/26/2020 Document Reviewed: 03/26/2020 Elsevier Patient Education  2022 Elsevier Inc.  

## 2020-12-08 DIAGNOSIS — G4733 Obstructive sleep apnea (adult) (pediatric): Secondary | ICD-10-CM | POA: Diagnosis not present

## 2020-12-23 ENCOUNTER — Other Ambulatory Visit: Payer: Self-pay | Admitting: Family Medicine

## 2020-12-23 NOTE — Telephone Encounter (Signed)
Requested Prescriptions  Pending Prescriptions Disp Refills  . losartan (COZAAR) 25 MG tablet [Pharmacy Med Name: LOSARTAN POTASSIUM 25 MG Tablet] 90 tablet 0    Sig: TAKE 1 TABLET EVERY DAY     Cardiovascular:  Angiotensin Receptor Blockers Passed - 12/23/2020  3:45 AM      Passed - Cr in normal range and within 180 days    Creat  Date Value Ref Range Status  08/06/2020 0.87 0.60 - 0.93 mg/dL Final    Comment:    For patients >73 years of age, the reference limit for Creatinine is approximately 13% higher for people identified as African-American. .          Passed - K in normal range and within 180 days    Potassium  Date Value Ref Range Status  08/06/2020 3.9 3.5 - 5.3 mmol/L Final         Passed - Patient is not pregnant      Passed - Last BP in normal range    BP Readings from Last 1 Encounters:  12/07/20 130/72         Passed - Valid encounter within last 6 months    Recent Outpatient Visits          4 months ago Pure hypercholesterolemia   [facility] Steele Sizer, MD   7 months ago Nasal congestion   [facility] Steele Sizer, MD   10 months ago Essential hypertension   [facility] Steele Sizer, MD   11 months ago Essential hypertension   [facility] Delsa Grana, PA-C   1 year ago Daytime sleepiness   [facility] Delsa Grana, PA-C      Future Appointments            In 2 months [facility]

## 2021-01-08 DIAGNOSIS — G4733 Obstructive sleep apnea (adult) (pediatric): Secondary | ICD-10-CM | POA: Diagnosis not present

## 2021-01-11 ENCOUNTER — Telehealth: Payer: Self-pay

## 2021-01-11 NOTE — Progress Notes (Signed)
    Chronic Care Management Pharmacy Assistant   Name: Tericka Beyler  MRN: [id number] DOB: 05/21/1945  Patient called to be reminded of her appointment with Junius Argyle, CPP on 01/13/2021 @ 0915 via telephone.  Patient aware of appointment date, time, and type of appointment (either telephone or in person). Patient aware to have/bring all medications, supplements, blood pressure and/or blood sugar logs to visit.  Questions: Are there any concerns you would like to discuss during your office visit? Nothing she can think of  Are you having any problems obtaining your medications? No  Star Rating Drug: Losartan 25 mg last filled on 10/11/2020 for a 90-Day supply no pharmacy indicated Rosuvastatin 20 mg last filled on 10/12/2020 for a 90-Day supply no pharmacy indicated  Any gaps in medications fill history? No  Care Gaps: COVID-19 Booster 4 Influenza Vaccine   Lynann Bologna, CPA/CMA Clinical Pharmacist Assistant Phone: [phone]

## 2021-01-12 ENCOUNTER — Ambulatory Visit (INDEPENDENT_AMBULATORY_CARE_PROVIDER_SITE_OTHER): Payer: Medicare HMO

## 2021-01-12 DIAGNOSIS — E7800 Pure hypercholesterolemia, unspecified: Secondary | ICD-10-CM

## 2021-01-12 DIAGNOSIS — E039 Hypothyroidism, unspecified: Secondary | ICD-10-CM

## 2021-01-12 DIAGNOSIS — I10 Essential (primary) hypertension: Secondary | ICD-10-CM

## 2021-01-12 NOTE — Progress Notes (Signed)
Chronic Care Management Pharmacy Note  01/12/2021 Name:  Paige [name] MRN:  [id number] DOB:  05/13/1945  Subjective: Paige [name] is an 75 y.o. year old female who is a primary patient of Steele Sizer, MD.  The CCM team was consulted for assistance with disease management and care coordination needs.    Engaged with patient by telephone for follow up visit in response to provider referral for pharmacy case management and/or care coordination services.   Consent to Services:  The patient was given information about Chronic Care Management services, agreed to services, and gave verbal consent prior to initiation of services.  Please see initial visit note for detailed documentation.   Patient Care Team: Steele Sizer, MD as PCP - General (Family Medicine) Birder Robson, MD as Referring Physician (Ophthalmology) Carloyn Manner, MD as Referring Physician (Otolaryngology) Germaine Pomfret, [facility] as Pharmacist (Pharmacist)  Recent office visits: 05/26/2020: Video visit with Dr. Ancil Boozer for Nasal Congestion. Patient started on azelastine spray.  03/04/20: Patient presented to Clemetine Marker, LPN for AWV.   Recent consult visits: 04/20/20: Patient presented to Dr. Humphrey Rolls (Pulmonology) for OSA follow-up.   Hospital visits: None in previous 6 months  Objective:  Lab Results  Component Value Date   CREATININE 0.87 08/06/2020   BUN 21 08/06/2020   GFRNONAA 67 08/06/2020   GFRAA 77 08/06/2020   NA 141 08/06/2020   K 3.9 08/06/2020   CALCIUM 9.2 08/06/2020   CO2 27 08/06/2020    Lab Results  Component Value Date/Time   HGBA1C 5.1 08/06/2019 08:28 AM    Last diabetic Eye exam: No results found for: HMDIABEYEEXA  Last diabetic Foot exam: No results found for: HMDIABFOOTEX   Lab Results  Component Value Date   CHOL 137 08/06/2020   HDL 50 08/06/2020   LDLCALC 71 08/06/2020   TRIG 82 08/06/2020   CHOLHDL 2.7 08/06/2020    Hepatic Function Latest Ref  Rng & Units 08/06/2020 02/05/2020 08/06/2019  Total Protein 6.1 - 8.1 g/dL 6.9 7.2 6.9  Albumin 3.6 - 4.8 g/dL - - -  AST 10 - 35 U/L $Remo'17 19 19  'pindZ$ ALT 6 - 29 U/L $Remo'14 15 17  'buCkI$ Alk Phosphatase 39 - 117 IU/L - - -  Total Bilirubin 0.2 - 1.2 mg/dL 0.6 0.7 0.7  Bilirubin, Direct 0.0 - 0.2 mg/dL - 0.2 -    Lab Results  Component Value Date/Time   TSH 1.06 10/04/2020 10:16 AM   TSH 0.31 (L) 08/06/2020 08:41 AM   FREET4 1.46 10/13/2014 04:29 PM    CBC Latest Ref Rng & Units 08/06/2020 08/06/2019  WBC 3.8 - 10.8 Thousand/uL 4.9 5.5  Hemoglobin 11.7 - 15.5 g/dL 14.2 13.6  Hematocrit 35.0 - 45.0 % 43.5 41.5  Platelets 140 - 400 Thousand/uL 185 181   Last DEXA Scan: 09/14/2020  T-Score femoral neck: -1.5  T-Score total hip: -0.9  T-Score lumbar spine: +0.4  T-Score forearm radius: NA  10-year probability of major osteoporotic fracture: 10.7%  10-year probability of hip fracture: 1.9%   Lab Results  Component Value Date/Time   VD25OH 66 08/06/2020 08:41 AM    Clinical ASCVD: No  The 10-year ASCVD risk score (Arnett DK, et al., 2019) is: 16.7%   Values used to calculate the score:     Age: 49 years     Sex: Female     Is Non-Hispanic African American: No     Diabetic: No     Tobacco smoker: No  Systolic Blood Pressure: 956 mmHg     Is BP treated: Yes     HDL Cholesterol: 50 mg/dL     Total Cholesterol: 137 mg/dL    Depression screen [facility] 2/9 08/06/2020 05/26/2020 03/04/2020  Decreased Interest 0 1 0  Down, Depressed, Hopeless 0 1 1  PHQ - 2 Score 0 2 1  Altered sleeping - 0 1  Tired, decreased energy - 0 1  Change in appetite - 0 0  Feeling bad or failure about yourself  - 0 0  Trouble concentrating - 0 0  Moving slowly or fidgety/restless - 0 0  Suicidal thoughts - 0 0  PHQ-9 Score - 2 3  Difficult doing work/chores - Not difficult at all Not difficult at all  Some recent data might be hidden     Social History   Tobacco Use  Smoking Status Former   Packs/day: 0.25   Years:  5.00   Pack years: 1.25   Types: Cigarettes   Start date: 05/01/1972   Quit date: 05/01/1977   Years since quitting: 43.7  Smokeless Tobacco Never   BP Readings from Last 3 Encounters:  12/07/20 130/72  08/06/20 122/68  07/28/20 120/70   Pulse Readings from Last 3 Encounters:  12/07/20 61  08/06/20 77  04/20/20 70   Wt Readings from Last 3 Encounters:  12/07/20 162 lb 9.6 oz (73.8 kg)  08/06/20 162 lb (73.5 kg)  07/28/20 164 lb 3.2 oz (74.5 kg)    Assessment/Interventions: Review of patient past medical history, allergies, medications, health status, including review of consultants reports, laboratory and other test data, was performed as part of comprehensive evaluation and provision of chronic care management services.   SDOH:  (Social Determinants of Health) assessments and interventions performed: Yes    CCM Care Plan  Allergies  Allergen Reactions   Codeine Nausea And Vomiting    Medications Reviewed Today     Reviewed by Edd Arbour, [place] (Certified Medical Assistant) on 12/07/20 at 1430  Med List Status: <None>   Medication Order Taking? Sig Documenting Provider Last Dose Status Informant  aspirin 81 MG tablet [id number]  Take 1 tablet by mouth daily. Roselee Nova, MD  Active            Med Note Gloris Ham, [place] D   Tue Mar 04, 2019  1:57 PM)    azelastine (ASTELIN) 0.1 % nasal spray [id number]  PLACE 2 SPRAYS INTO BOTH NOSTRILS 2 (TWO) TIMES DAILY. USE IN [facility] NOSTRIL AS DIRECTED Steele Sizer, MD  Active   Calcium-Phosphorus-Vitamin D [phone] MG-MG-UNIT CHEW [id number]  Chew 1 tablet by mouth. Roselee Nova, MD  Active            Med Note Gloris Ham, [place] D   Tue Mar 04, 2019  1:57 PM)    co-enzyme Q-10 30 MG capsule [id number]  Take 30 mg by mouth 3 (three) times daily. [provider]  Active   cyanocobalamin 100 MCG tablet [id number]  Take 1,000 mcg by mouth daily.  [provider]  Active Self  gabapentin (NEURONTIN) 100 MG capsule  [id number]  Take 1-2 capsules (100-200 mg total) by mouth at bedtime. Steele Sizer, MD  Active   glucosamine-chondroitin 500-400 MG tablet [id number]  Take 1 tablet by mouth 3 (three) times daily. [provider]  Active   hydrocortisone cream 1 % [id number]  Apply 1 application topically daily. Homero Fellers, MD  Active   levothyroxine (SYNTHROID) 88 MCG tablet  [id number]  TAKE 1 TABLET (88 MCG TOTAL) BY MOUTH DAILY BEFORE BREAKFAST. Steele Sizer, MD  Active   losartan (COZAAR) 25 MG tablet [id number]  Take 1 tablet (25 mg total) by mouth daily. Steele Sizer, MD  Active   meloxicam (MOBIC) 7.5 MG tablet [id number]  Take 1 tablet (7.5 mg total) by mouth daily. Steele Sizer, MD  Active   MULTIPLE VITAMIN PO [id number]  Take by mouth daily. Roselee Nova, MD  Active            Med Note Gloris Ham, [place] D   Tue Mar 04, 2019  1:58 PM)    Multiple Vitamins-Minerals [facility] SKIN AND NAILS FORMULA PO) [id number]  Take by mouth.  [provider]  Active   Omega-3 Fatty Acids (FISH OIL BURP-LESS) 1200 MG CAPS [id number]  Take 1 capsule by mouth. Roselee Nova, MD  Active            Med Note Clemetine Marker D   Tue Mar 04, 2019  1:58 PM)    rosuvastatin (CRESTOR) 20 MG tablet [id number]  TAKE 1 TABLET EVERY DAY Ancil Boozer, Drue Stager, MD  Active   traZODone (DESYREL) 50 MG tablet [id number]  Take 25 mg by mouth at bedtime as needed for sleep. [provider]  Active             Patient Active Problem List   Diagnosis Date Noted   OSA (obstructive sleep apnea) 02/05/2020   Essential hypertension 08/28/2018   Osteopenia 08/28/2018   Hyperlipidemia 10/27/2014   Allergic rhinitis 10/27/2014   Adult hypothyroidism 10/13/2014    Immunization History  Administered Date(s) Administered   Fluad Quad(high Dose 65+) 02/11/2019, 01/23/2020   Influenza, High Dose Seasonal PF 03/16/2015, 02/26/2018   Influenza-Unspecified 03/16/2017   PFIZER(Purple Top)SARS-COV-2 Vaccination  06/21/2019, 07/15/2019, 03/31/2020   Pneumococcal Conjugate-13 11/20/2016   Pneumococcal Polysaccharide-23 05/01/2012, 02/26/2018    Conditions to be addressed/monitored:  Hypertension, Hyperlipidemia, Hypothyroidism and Osteopenia  There are no care plans that you recently modified to display for this patient.    Medication Assistance: None required.  Patient affirms current coverage meets needs.  Patient's preferred pharmacy is:  [id number] [address], [place] - [place] [address] Phone: [phone] Fax: [phone]  [facility] - [place], [address] [place] [address] Phone: [phone] Fax: [phone]  Uses pill box? Yes Pt endorses 100% compliance  We discussed: Current pharmacy is preferred with insurance plan and patient is satisfied with pharmacy services Patient decided to: Continue current medication management strategy  Care Plan and Follow Up Patient Decision:  Patient agrees to Care Plan and Follow-up.  Plan: Telephone follow up appointment with care management team member scheduled for:  07/20/20 at 11:00 AM  [facility] [phone]

## 2021-01-28 DIAGNOSIS — I10 Essential (primary) hypertension: Secondary | ICD-10-CM

## 2021-01-28 DIAGNOSIS — E7800 Pure hypercholesterolemia, unspecified: Secondary | ICD-10-CM | POA: Diagnosis not present

## 2021-01-28 DIAGNOSIS — E039 Hypothyroidism, unspecified: Secondary | ICD-10-CM

## 2021-01-28 NOTE — Patient Instructions (Signed)
Visit Information It was great speaking with you today!  Please let me know if you have any questions about our visit.   Goals Addressed             This Visit's Progress    Track and Manage My Blood Pressure-Hypertension   On track    Timeframe:  Long-Range Goal Priority:  High Start Date: 07/14/2020                            Expected End Date: 01/12/2022                      Follow Up within 90 days    - check blood pressure weekly    Why is this important?   You won't feel high blood pressure, but it can still hurt your blood vessels.  High blood pressure can cause heart or kidney problems. It can also cause a stroke.  Making lifestyle changes like losing a little weight or eating less salt will help.  Checking your blood pressure at home and at different times of the day can help to control blood pressure.  If the doctor prescribes medicine remember to take it the way the doctor ordered.  Call the office if you cannot afford the medicine or if there are questions about it.     Notes:         Patient Care Plan: General Pharmacy (Adult)     Problem Identified: Hypertension, Hyperlipidemia, Hypothyroidism and Osteopenia   Priority: High     Long-Range Goal: Patient-Specific Goal   Start Date: 07/14/2020  Expected End Date: 01/28/2022  This Visit's Progress: On track  Recent Progress: On track  Priority: High  Note:   Current Barriers:  No barriers noted  Pharmacist Clinical Goal(s):  Patient will maintain control of blood pressure as evidenced by BP less than 140/90  through collaboration with PharmD and provider.   Interventions: 1:1 collaboration with Steele Sizer, MD regarding development and update of comprehensive plan of care as evidenced by provider attestation and co-signature Inter-disciplinary care team collaboration (see longitudinal plan of care) Comprehensive medication review performed; medication list updated in electronic medical  record  Hypertension (BP goal <140/90) -Controlled -Current treatment: Losartan 25 mg 1 tablet daily  -Medications previously tried: NA  -Current home readings: 130s -Current dietary habits: Joined weight watchers, has lost 20 pounds since October.  -Current exercise habits: walking dog 2-3 times daily. Pool/gym weekly if able.   -Denies hypotensive/hypertensive symptoms -Educated on BP goals and benefits of medications for prevention of heart attack, stroke and kidney damage; Importance of home blood pressure monitoring; Proper BP monitoring technique; -Counseled to monitor BP at home weekly, document, and provide log at future appointments -Recommended to continue current medication  Hyperlipidemia: (LDL goal < 100) -Controlled -Current treatment: Rosuvastatin 20 mg daily  -Current antiplatelet treatment: Aspirin 81 mg daily   -Medications previously tried: NA  -bruising more easily  -Recommended to continue current medication  Hypothyroidism (Goal: Maintain Euthyroid state) -Controlled -Current treatment  Levothyroxine 88 mcg daily (1/2 tablet on Sunday)  -Medications previously tried: NA  -Recommended to continue current medication  Osteopenia (Goal Maintain bone density and prevent fractures) -Controlled -Patient is not a candidate for pharmacologic treatment -Current treatment  Calciuim + Phosphorous + Vitamin D 2 tablets daily  -Medications previously tried: NA  -Recommend [id number] units of vitamin D daily. Recommend 1200 mg of calcium  daily from dietary and supplemental sources. -Recommended to continue current medication  Hypothyroid (Goal: Maintain stable thyroid function ) -Controlled -Current treatment  Levothyroxine 88 mcg daily  -Medications previously tried: NA  -Recommended to continue current medication  Obstructive Sleep Apnea (Goal: Maintain quality sleep) -Controlled -Current treatment  Trazodone 50 mg 1/2 tablet nightly as needed   -Medications previously tried: NA - Patient reports taking trazodone infrequently, only a few times monthly.  - Her sleep quality has improved significantly with weight loss, she continues to be compliant with CPAP.  -Recommended to continue current medication  Patient Goals/Self-Care Activities Patient will:  - check blood pressure weekly, document, and provide at future appointments  Follow Up Plan: Telephone follow up appointment with care management team member scheduled for:  07/20/20 at 11:00 AM    Patient agreed to services and verbal consent obtained.   Patient verbalizes understanding of instructions provided today and agrees to view in [place].   Malva Limes, [facility] [phone]

## 2021-02-07 DIAGNOSIS — G4733 Obstructive sleep apnea (adult) (pediatric): Secondary | ICD-10-CM | POA: Diagnosis not present

## 2021-02-25 DIAGNOSIS — H43813 Vitreous degeneration, bilateral: Secondary | ICD-10-CM | POA: Diagnosis not present

## 2021-03-08 ENCOUNTER — Ambulatory Visit: Payer: Medicare Other

## 2021-03-10 DIAGNOSIS — G4733 Obstructive sleep apnea (adult) (pediatric): Secondary | ICD-10-CM | POA: Diagnosis not present

## 2021-03-14 ENCOUNTER — Telehealth: Payer: Self-pay

## 2021-03-15 NOTE — Telephone Encounter (Signed)
Close please °

## 2021-03-17 ENCOUNTER — Other Ambulatory Visit: Payer: Self-pay | Admitting: Family Medicine

## 2021-03-17 ENCOUNTER — Encounter: Payer: Self-pay | Admitting: Family Medicine

## 2021-03-17 DIAGNOSIS — H43399 Other vitreous opacities, unspecified eye: Secondary | ICD-10-CM

## 2021-03-25 ENCOUNTER — Telehealth: Payer: Medicare HMO | Admitting: Emergency Medicine

## 2021-03-25 ENCOUNTER — Ambulatory Visit: Payer: Self-pay

## 2021-03-25 DIAGNOSIS — U071 COVID-19: Secondary | ICD-10-CM

## 2021-03-25 MED ORDER — MOLNUPIRAVIR EUA 200MG CAPSULE: 4.0000 | ORAL_CAPSULE | Freq: Two times a day (BID) | ORAL | 0 refills | Status: AC

## 2021-03-25 NOTE — Telephone Encounter (Signed)
Patient called and says she tested positive for COVID this morning using a home test. She says on Tuesday she had nasal congestion like she was starting to get a sinus infection, then it went away and she was fine all day Wednesday. She says yesterday she started sneezing, a little cough, took mucinex and rested. She says last night her nose was stuffy and runny. Today she says the same symptoms (runny nose, mild sinus congestion, mild cough), so she took the test. She denies fever, denies SOB, she says she's out walking the dog now and feels a little better than yesterday. I advised to go on MyChart to do a virtual urgent care visit with a provider just in case she qualifies for the antiviral, home care advice given, patient verbalized understanding.   Message from [place] Callas sent at 03/25/2021 10:09 AM EST  Pt took a covid test this morning and she tested positive.  She just feels like she has the cold right now.  Her son wanted her to call and let someone know in case she feels worse.   CB#  [phone]    Reason for Disposition  [1] GPQDI-26 diagnosed by positive lab test (e.g., PCR, rapid self-test kit) AND [2] mild symptoms (e.g., cough, fever, others) AND [4] no complications or SOB  Answer Assessment - Initial Assessment Questions 1. COVID-19 DIAGNOSIS: "Who made your COVID-19 diagnosis?" "Was it confirmed by a positive lab test or self-test?" If not diagnosed by a doctor (or NP/PA), ask "Are there lots of cases (community spread) where you live?" Note: See public health department website, if unsure.     Tested positive home test today 2. COVID-19 EXPOSURE: "Was there any known exposure to COVID before the symptoms began?" CDC Definition of close contact: within 6 feet (2 meters) for a total of 15 minutes or more over a 24-hour period.      No 3. ONSET: "When did the COVID-19 symptoms start?"      Monday felt nasal symptoms (like a sinus infection), then it went away, yesterday  sneezing  4. WORST SYMPTOM: "What is your worst symptom?" (e.g., cough, fever, shortness of breath, muscle aches)     Runny nose 5. COUGH: "Do you have a cough?" If Yes, ask: "How bad is the cough?"       A little 6. FEVER: "Do you have a fever?" If Yes, ask: "What is your temperature, how was it measured, and when did it start?"     No 7. RESPIRATORY STATUS: "Describe your breathing?" (e.g., shortness of breath, wheezing, unable to speak)      No 8. BETTER-SAME-WORSE: "Are you getting better, staying the same or getting worse compared to yesterday?"  If getting worse, ask, "In what way?"     A little better than yesterday 9. HIGH RISK DISEASE: "Do you have any chronic medical problems?" (e.g., asthma, heart or lung disease, weak immune system, obesity, etc.)     No 10. VACCINE: "Have you had the COVID-19 vaccine?" If Yes, ask: "Which one, how many shots, when did you get it?"       Yes 2 shots pfizer 11. BOOSTER: "Have you received your COVID-19 booster?" If Yes, ask: "Which one and when did you get it?"       1 pfizer booster 12. PREGNANCY: "Is there any chance you are pregnant?" "When was your last menstrual period?"       Np 13. OTHER SYMPTOMS: "Do you have any other symptoms?"  (  e.g., chills, fatigue, headache, loss of smell or taste, muscle pain, sore throat)       Sinus congestion (mild) 14. O2 SATURATION MONITOR:  "Do you use an oxygen saturation monitor (pulse oximeter) at home?" If Yes, ask "What is your reading (oxygen level) today?" "What is your usual oxygen saturation reading?" (e.g., 95%)       N/A  Protocols used: Coronavirus (COVID-19) Diagnosed or Suspected-A-AH

## 2021-03-25 NOTE — Progress Notes (Signed)
Virtual Visit Consent   Henna Zercher Nylin, you are scheduled for a virtual visit with a [facility] provider today.     Just as with appointments in the office, your consent must be obtained to participate.  Your consent will be active for this visit and any virtual visit you may have with one of our providers in the next 365 days.     If you have a MyChart account, a copy of this consent can be sent to you electronically.  All virtual visits are billed to your insurance company just like a traditional visit in the office.    As this is a virtual visit, video technology does not allow for your provider to perform a traditional examination.  This may limit your provider's ability to fully assess your condition.  If your provider identifies any concerns that need to be evaluated in person or the need to arrange testing (such as labs, EKG, etc.), we will make arrangements to do so.     Although advances in technology are sophisticated, we cannot ensure that it will always work on either your end or our end.  If the connection with a video visit is poor, the visit may have to be switched to a telephone visit.  With either a video or telephone visit, we are not always able to ensure that we have a secure connection.     I need to obtain your verbal consent now.   Are you willing to proceed with your visit today?    Ileane Zercher Nylin has provided verbal consent on 03/25/2021 for a virtual visit (video or telephone).   Carvel Getting, NP   Date: 03/25/2021 2:28 PM   Virtual Visit via Video Note   I, Carvel Getting, connected with  Keegan Bensch Nylin  ([id number], May 04, 1945) on 03/25/21 at  2:15 PM EST by a video-enabled telemedicine application and verified that I am speaking with the correct person using two identifiers.  Location: Patient: Virtual Visit Location Patient: Home Provider: Virtual Visit Location Provider: Home Office   I discussed the limitations of evaluation and  management by telemedicine and the availability of in person appointments. The patient expressed understanding and agreed to proceed.    History of Present Illness: Paige [name] is a 75 y.o. who identifies as a female who was assigned female at birth, and is being seen today for a positive COVID test.  Patient had slight congestion on 03/21/2021 that was not bothersome.  Went to work on 03/23/2021.  The evening of 03/24/2021.  She felt a little worse with sneezing, runny nose, cough.  This morning she tested herself and was positive at home for COVID.  Denies shortness of breath or wheezing.  States she feels like she has a regular cold.   HPI: HPI  Problems:  Patient Active Problem List   Diagnosis Date Noted   OSA (obstructive sleep apnea) 02/05/2020   Essential hypertension 08/28/2018   Osteopenia 08/28/2018   Hyperlipidemia 10/27/2014   Allergic rhinitis 10/27/2014   Adult hypothyroidism 10/13/2014    Allergies:  Allergies  Allergen Reactions   Codeine Nausea And Vomiting   Medications:  Current Outpatient Medications:    molnupiravir EUA (LAGEVRIO) 200 mg CAPS capsule, Take 4 capsules (800 mg total) by mouth 2 (two) times daily for 5 days., Disp: 40 capsule, Rfl: 0   aspirin 81 MG tablet, Take 1 tablet by mouth daily., Disp: , Rfl:    Calcium-Phosphorus-Vitamin D [id number] MG-MG-UNIT CHEW,  Chew 1 tablet by mouth., Disp: , Rfl:    Coenzyme Q10 100 MG TABS, Take 100 mg by mouth daily., Disp: , Rfl:    cyanocobalamin 100 MCG tablet, Take 1,000 mcg by mouth daily. , Disp: , Rfl:    diphenhydrAMINE (BENADRYL) 25 MG tablet, Take 12.5 mg by mouth at bedtime as needed., Disp: , Rfl:    glucosamine-chondroitin 500-400 MG tablet, Take 1 tablet by mouth 3 (three) times daily., Disp: , Rfl:    hydrocortisone cream 1 %, Apply 1 application topically daily., Disp: 45 g, Rfl: 0   levothyroxine (SYNTHROID) 88 MCG tablet, TAKE 1 TABLET (88 MCG TOTAL) BY MOUTH DAILY BEFORE BREAKFAST.,  Disp: 90 tablet, Rfl: 1   losartan (COZAAR) 25 MG tablet, TAKE 1 TABLET EVERY DAY, Disp: 90 tablet, Rfl: 0   MULTIPLE VITAMIN PO, Take by mouth daily., Disp: , Rfl:    Multiple Vitamins-Minerals (HAIR SKIN AND NAILS FORMULA PO), Take 1 tablet by mouth daily., Disp: , Rfl:    Omega-3 Fatty Acids (FISH OIL BURP-LESS) 1200 MG CAPS, Take 1 capsule by mouth., Disp: , Rfl:    rosuvastatin (CRESTOR) 20 MG tablet, TAKE 1 TABLET EVERY DAY, Disp: 90 tablet, Rfl: 1  Observations/Objective: Patient is well-developed, well-nourished in no acute distress.  Resting comfortably  at home.  Head is normocephalic, atraumatic.  No labored breathing.  Speech is clear and coherent with logical content.  Patient is alert and oriented at baseline.  She sounds congested  Assessment and Plan: 1. COVID-19  Given patient's age and comorbidities, I prescribed molnupiravir.  Reviewed supportive care measures.  Reviewed quarantine procedures.  Reviewed reasons for seeking a higher level of care.  Follow Up Instructions: I discussed the assessment and treatment plan with the patient. The patient was provided an opportunity to ask questions and all were answered. The patient agreed with the plan and demonstrated an understanding of the instructions.  A copy of instructions were sent to the patient via MyChart unless otherwise noted below.   The patient was advised to call back or seek an in-person evaluation if the symptoms worsen or if the condition fails to improve as anticipated.  Time:  I spent 12 minutes with the patient via telehealth technology discussing the above problems/concerns.    Carvel Getting, NP

## 2021-03-25 NOTE — Patient Instructions (Signed)
Jennylee Zercher [name], thank you for joining Carvel Getting, NP for today's virtual visit.  While this provider is not your primary care provider (PCP), if your PCP is located in our provider database this encounter information will be shared with them immediately following your visit.  Consent: (Patient) Paige [name] provided verbal consent for this virtual visit at the beginning of the encounter.  Current Medications:  Current Outpatient Medications:    molnupiravir EUA (LAGEVRIO) 200 mg CAPS capsule, Take 4 capsules (800 mg total) by mouth 2 (two) times daily for 5 days., Disp: 40 capsule, Rfl: 0   aspirin 81 MG tablet, Take 1 tablet by mouth daily., Disp: , Rfl:    Calcium-Phosphorus-Vitamin D 100-50-100 MG-MG-UNIT CHEW, Chew 1 tablet by mouth., Disp: , Rfl:    Coenzyme Q10 100 MG TABS, Take 100 mg by mouth daily., Disp: , Rfl:    cyanocobalamin 100 MCG tablet, Take 1,000 mcg by mouth daily. , Disp: , Rfl:    diphenhydrAMINE (BENADRYL) 25 MG tablet, Take 12.5 mg by mouth at bedtime as needed., Disp: , Rfl:    glucosamine-chondroitin 500-400 MG tablet, Take 1 tablet by mouth 3 (three) times daily., Disp: , Rfl:    hydrocortisone cream 1 %, Apply 1 application topically daily., Disp: 45 g, Rfl: 0   levothyroxine (SYNTHROID) 88 MCG tablet, TAKE 1 TABLET (88 MCG TOTAL) BY MOUTH DAILY BEFORE BREAKFAST., Disp: 90 tablet, Rfl: 1   losartan (COZAAR) 25 MG tablet, TAKE 1 TABLET EVERY DAY, Disp: 90 tablet, Rfl: 0   MULTIPLE VITAMIN PO, Take by mouth daily., Disp: , Rfl:    Multiple Vitamins-Minerals (HAIR SKIN AND NAILS FORMULA PO), Take 1 tablet by mouth daily., Disp: , Rfl:    Omega-3 Fatty Acids (FISH OIL BURP-LESS) 1200 MG CAPS, Take 1 capsule by mouth., Disp: , Rfl:    rosuvastatin (CRESTOR) 20 MG tablet, TAKE 1 TABLET EVERY DAY, Disp: 90 tablet, Rfl: 1   Medications ordered in this encounter:  Meds ordered this encounter  Medications   molnupiravir EUA (LAGEVRIO) 200 mg CAPS  capsule    Sig: Take 4 capsules (800 mg total) by mouth 2 (two) times daily for 5 days.    Dispense:  40 capsule    Refill:  0     *If you need refills on other medications prior to your next appointment, please contact your pharmacy*  Follow-Up: Call back or seek an in-person evaluation if the symptoms worsen or if the condition fails to improve as anticipated.  Other Instructions Manage your symptoms as you would any other cold.  Please keep well-hydrated and get plenty of rest. Start a saline nasal rinse to flush out your nasal passages. You can use plain Mucinex to help thin congestion. Please take prescribed medications as directed.  You were to quarantine for 5 days from onset of your symptoms.  After day 5, if you have had no fever and you are feeling better, you can end quarantine but need to mask for an additional 5 days. After day 5 if you have a fever or are having significant symptoms, please quarantine for full 10 days.  If you note any worsening of symptoms, any significant shortness of breath or any chest pain, please seek ER evaluation ASAP.  Please do not delay care!  COVID-19: What to Do if You Are Sick If you test positive and are an older adult or someone who is at high risk of getting very sick from COVID-19, treatment may  be available. Contact a healthcare provider right away after a positive test to determine if you are eligible, even if your symptoms are mild right now. You can also visit a Test to Treat location and, if eligible, receive a prescription from a provider. Don't delay: Treatment must be started within the first few days to be effective. If you have a fever, cough, or other symptoms, you might have COVID-19. Most people have mild illness and are able to recover at home. If you are sick: Keep track of your symptoms. If you have an emergency warning sign (including trouble breathing), call 911. Steps to help prevent the spread of COVID-19 if you are  sick If you are sick with COVID-19 or think you might have COVID-19, follow the steps below to care for yourself and to help protect other people in your home and community. Stay home except to get medical care Stay home. Most people with COVID-19 have mild illness and can recover at home without medical care. Do not leave your home, except to get medical care. Do not visit public areas and do not go to places where you are unable to wear a mask. Take care of yourself. Get rest and stay hydrated. Take over-the-counter medicines, such as acetaminophen, to help you feel better. Stay in touch with your doctor. Call before you get medical care. Be sure to get care if you have trouble breathing, or have any other emergency warning signs, or if you think it is an emergency. Avoid public transportation, ride-sharing, or taxis if possible. Get tested If you have symptoms of COVID-19, get tested. While waiting for test results, stay away from others, including staying apart from those living in your household. Get tested as soon as possible after your symptoms start. Treatments may be available for people with COVID-19 who are at risk for becoming very sick. Don't delay: Treatment must be started early to be effective--some treatments must begin within 5 days of your first symptoms. Contact your healthcare provider right away if your test result is positive to determine if you are eligible. Self-tests are one of several options for testing for the virus that causes COVID-19 and may be more convenient than laboratory-based tests and point-of-care tests. Ask your healthcare provider or your local health department if you need help interpreting your test results. You can visit your state, tribal, local, and territorial health department's website to look for the latest local information on testing sites. Separate yourself from other people As much as possible, stay in a specific room and away from other people and  pets in your home. If possible, you should use a separate bathroom. If you need to be around other people or animals in or outside of the home, wear a well-fitting mask. Tell your close contacts that they may have been exposed to COVID-19. An infected person can spread COVID-19 starting 48 hours (or 2 days) before the person has any symptoms or tests positive. By letting your close contacts know they may have been exposed to COVID-19, you are helping to protect everyone. See COVID-19 and Animals if you have questions about pets. If you are diagnosed with COVID-19, someone from the health department may call you. Answer the call to slow the spread. Monitor your symptoms Symptoms of COVID-19 include fever, cough, or other symptoms. Follow care instructions from your healthcare provider and local health department. Your local health authorities may give instructions on checking your symptoms and reporting information. When to seek emergency medical  attention Look for emergency warning signs* for COVID-19. If someone is showing any of these signs, seek emergency medical care immediately: Trouble breathing Persistent pain or pressure in the chest New confusion Inability to wake or stay awake Pale, gray, or blue-colored skin, lips, or nail beds, depending on skin tone *This list is not all possible symptoms. Please call your medical provider for any other symptoms that are severe or concerning to you. Call 911 or call ahead to your local emergency facility: Notify the operator that you are seeking care for someone who has or may have COVID-19. Call ahead before visiting your doctor Call ahead. Many medical visits for routine care are being postponed or done by phone or telemedicine. If you have a medical appointment that cannot be postponed, call your doctor's office, and tell them you have or may have COVID-19. This will help the office protect themselves and other patients. If you are sick, wear a  well-fitting mask You should wear a mask if you must be around other people or animals, including pets (even at home). Wear a mask with the best fit, protection, and comfort for you. You don't need to wear the mask if you are alone. If you can't put on a mask (because of trouble breathing, for example), cover your coughs and sneezes in some other way. Try to stay at least 6 feet away from other people. This will help protect the people around you. Masks should not be placed on young children under age 49 years, anyone who has trouble breathing, or anyone who is not able to remove the mask without help. Cover your coughs and sneezes Cover your mouth and nose with a tissue when you cough or sneeze. Throw away used tissues in a lined trash can. Immediately wash your hands with soap and water for at least 20 seconds. If soap and water are not available, clean your hands with an alcohol-based hand sanitizer that contains at least 60% alcohol. Clean your hands often Wash your hands often with soap and water for at least 20 seconds. This is especially important after blowing your nose, coughing, or sneezing; going to the bathroom; and before eating or preparing food. Use hand sanitizer if soap and water are not available. Use an alcohol-based hand sanitizer with at least 60% alcohol, covering all surfaces of your hands and rubbing them together until they feel dry. Soap and water are the best option, especially if hands are visibly dirty. Avoid touching your eyes, nose, and mouth with unwashed hands. Handwashing Tips Avoid sharing personal household items Do not share dishes, drinking glasses, cups, eating utensils, towels, or bedding with other people in your home. Wash these items thoroughly after using them with soap and water or put in the dishwasher. Clean surfaces in your home regularly Clean and disinfect high-touch surfaces (for example, doorknobs, tables, handles, light switches, and countertops)  in your "sick room" and bathroom. In shared spaces, you should clean and disinfect surfaces and items after each use by the person who is ill. If you are sick and cannot clean, a caregiver or other person should only clean and disinfect the area around you (such as your bedroom and bathroom) on an as needed basis. Your caregiver/other person should wait as long as possible (at least several hours) and wear a mask before entering, cleaning, and disinfecting shared spaces that you use. Clean and disinfect areas that may have blood, stool, or body fluids on them. Use household cleaners and disinfectants. Clean  visible dirty surfaces with household cleaners containing soap or detergent. Then, use a household disinfectant. Use a product from [employer] List N: Disinfectants for Coronavirus (PRFFM-38). Be sure to follow the instructions on the label to ensure safe and effective use of the product. Many products recommend keeping the surface wet with a disinfectant for a certain period of time (look at "contact time" on the product label). You may also need to wear personal protective equipment, such as gloves, depending on the directions on the product label. Immediately after disinfecting, wash your hands with soap and water for 20 seconds. For completed guidance on cleaning and disinfecting your home, visit Complete Disinfection Guidance. Take steps to improve ventilation at home Improve ventilation (air flow) at home to help prevent from spreading COVID-19 to other people in your household. Clear out COVID-19 virus particles in the air by opening windows, using air filters, and turning on fans in your home. Use this interactive tool to learn how to improve air flow in your home. When you can be around others after being sick with COVID-19 Deciding when you can be around others is different for different situations. Find out when you can safely end home isolation. For any additional questions about your care,  contact your healthcare provider or state or local health department. 07/20/2020 Content source: [facility] ([facility]), Division of Viral Diseases This information is not intended to replace advice given to you by your health care provider. Make sure you discuss any questions you have with your health care provider. Document Revised: 09/02/2020 Document Reviewed: 09/02/2020 Elsevier Patient Education  2022 [employer].      If you have been instructed to have an in-person evaluation today at a local Urgent Care facility, please use the link below. It will take you to a list of all of our available [facility], including address, phone number and hours of operation. Please do not delay care.  [facility]  If you or a family member do not have a primary care provider, use the link below to schedule a visit and establish care. When you choose a [facility] primary care physician or advanced practice provider, you gain a long-term partner in health. Find a Primary Care Provider  Learn more about [facility] in-office and virtual care options: [facility]

## 2021-03-28 NOTE — Telephone Encounter (Signed)
Spoke to pt and she states shes feeling better and does not need an appt

## 2021-04-06 ENCOUNTER — Telehealth: Payer: Self-pay

## 2021-04-06 NOTE — Progress Notes (Signed)
Chronic Care Management Pharmacy Assistant   Name: Paige [name]  MRN: [id number] DOB: [birth year]  Reason for Encounter: Hypertension Disease State Call   Recent office visits:  03/25/2021 Willeen Cass, NP (Video Visit) for [id number]  Started: Molnupiravir 800 mg twice daily, no orders placed  Recent consult visits:  None ID  Hospital visits:  None in previous 6 months  Medications: Outpatient Encounter Medications as of 04/06/2021  Medication Sig   aspirin 81 MG tablet Take 1 tablet by mouth daily.   Calcium-Phosphorus-Vitamin D 100-50-100 MG-MG-UNIT CHEW Chew 1 tablet by mouth.   Coenzyme Q10 100 MG TABS Take 100 mg by mouth daily.   cyanocobalamin 100 MCG tablet Take 1,000 mcg by mouth daily.    diphenhydrAMINE (BENADRYL) 25 MG tablet Take 12.5 mg by mouth at bedtime as needed.   glucosamine-chondroitin 500-400 MG tablet Take 1 tablet by mouth 3 (three) times daily.   hydrocortisone cream 1 % Apply 1 application topically daily.   levothyroxine (SYNTHROID) 88 MCG tablet TAKE 1 TABLET (88 MCG TOTAL) BY MOUTH DAILY BEFORE BREAKFAST.   losartan (COZAAR) 25 MG tablet TAKE 1 TABLET EVERY DAY   MULTIPLE VITAMIN PO Take by mouth daily.   Multiple Vitamins-Minerals (HAIR SKIN AND NAILS FORMULA PO) Take 1 tablet by mouth daily.   Omega-3 Fatty Acids (FISH OIL BURP-LESS) 1200 MG CAPS Take 1 capsule by mouth.   rosuvastatin (CRESTOR) 20 MG tablet TAKE 1 TABLET EVERY DAY   No facility-administered encounter medications on file as of 04/06/2021.   Care Gaps: Zoster Vaccine COVID-19 Vaccine Booster 4 Influenza Vaccine  Star Rating Drugs: Losartan 25 mg last filled on 12/24/2020 for a 90-Day supply with no pharmacy noted Rosuvastatin 25 mg last filled on 12/24/2020 for a 90-Day supply with no pharmacy noted  Reviewed chart prior to disease state call. Spoke with patient regarding BP  Recent Office Vitals: BP Readings from Last 3 Encounters:  12/07/20 130/72  08/06/20  122/68  07/28/20 120/70   Pulse Readings from Last 3 Encounters:  12/07/20 61  08/06/20 77  04/20/20 70    Wt Readings from Last 3 Encounters:  12/07/20 162 lb 9.6 oz (73.8 kg)  08/06/20 162 lb (73.5 kg)  07/28/20 164 lb 3.2 oz (74.5 kg)     Kidney Function Lab Results  Component Value Date/Time   CREATININE 0.87 08/06/2020 08:41 AM   CREATININE 0.92 08/06/2019 08:28 AM   GFRNONAA 67 08/06/2020 08:41 AM   GFRAA 77 08/06/2020 08:41 AM    BMP Latest Ref Rng & Units 08/06/2020 08/06/2019 09/02/2018  Glucose 65 - 99 mg/dL 94 103(H) 100(H)  BUN 7 - 25 mg/dL 21 23 20   Creatinine 0.60 - 0.93 mg/dL 0.87 0.92 0.83  BUN/Creat Ratio 6 - 22 (calc) NOT APPLICABLE NOT APPLICABLE NOT APPLICABLE  Sodium 536 - 146 mmol/L 141 145 141  Potassium 3.5 - 5.3 mmol/L 3.9 4.3 4.0  Chloride 98 - 110 mmol/L 107 108 106  CO2 20 - 32 mmol/L 27 31 29   Calcium 8.6 - 10.4 mg/dL 9.2 9.4 9.3    Current antihypertensive regimen:  Losartan 25 mg 1 tablet daily  How often are you checking your Blood Pressure?  Patient reports she hasn't taken it in a few weeks  Current home BP readings: Patient is unable to give me a current reading   What recent interventions/DTPs have been made by any provider to improve Blood Pressure control since last CPP Visit: None ID  Any recent hospitalizations  or ED visits since last visit with CPP? No  What diet changes have been made to improve Blood Pressure Control?  Patient is doing Weight Watchers X 1 year and so far has lost 26 lbs so far  What exercise is being done to improve your Blood Pressure Control?  Patient walks her dogs several times daily  Adherence Review: Is the patient currently on ACE/ARB medication? Yes Does the patient have >5 day gap between last estimated fill dates? No  Patient reports that she is doing well. Patient denies any ill symptoms at this time. She is taking all medications as directed, and is in need of no refills. Patient has no concerns  or issues at this time.    Patient has telephone appointment scheduled with Junius Argyle, CPP on 07/20/2020 @ [facility], [place] [employer] theatre[employer]television[employer] Phone: [phone]

## 2021-04-08 DIAGNOSIS — H43813 Vitreous degeneration, bilateral: Secondary | ICD-10-CM | POA: Diagnosis not present

## 2021-04-21 ENCOUNTER — Encounter: Payer: Self-pay | Admitting: Internal Medicine

## 2021-04-21 ENCOUNTER — Telehealth: Payer: Self-pay | Admitting: Family Medicine

## 2021-04-21 NOTE — Telephone Encounter (Signed)
Opened in error

## 2021-04-26 ENCOUNTER — Ambulatory Visit (INDEPENDENT_AMBULATORY_CARE_PROVIDER_SITE_OTHER): Payer: Medicare HMO

## 2021-04-26 VITALS — BP 118/66 | HR 66 | Temp 98.3°F | Resp 16 | Ht 65.0 in | Wt 155.3 lb

## 2021-04-26 DIAGNOSIS — Z0000 Encounter for general adult medical examination without abnormal findings: Secondary | ICD-10-CM | POA: Diagnosis not present

## 2021-04-26 NOTE — Progress Notes (Signed)
Subjective:   Paige [name] is a 75 y.o. female who presents for Medicare Annual (Subsequent) preventive examination.  Review of Systems     Cardiac Risk Factors include: advanced age (>55men, >10 women);hypertension;dyslipidemia     Objective:    Today's Vitals   04/26/21 1423  BP: 118/66  Pulse: 66  Resp: 16  Temp: 98.3 F (36.8 C)  TempSrc: Oral  SpO2: 99%  Weight: 155 lb 4.8 oz (70.4 kg)  Height: 5\' 5"  (1.651 m)   Body mass index is 25.84 kg/m.  Advanced Directives 04/26/2021 03/04/2020 03/04/2019 01/22/2017 01/15/2017 11/20/2016 09/06/2016  Does Patient Have a Medical Advance Directive? Yes Yes No No No No No  Type of Paramedic of [place];Living will [place];Living will - - - - -  Copy of [place] in Chart? No - copy requested No - copy requested - - - - -  Would patient like information on creating a medical advance directive? - - Yes (MAU/Ambulatory/Procedural Areas - Information given) - - Yes (ED - Information included in AVS) -    Current Medications (verified) Outpatient Encounter Medications as of 04/26/2021  Medication Sig   aspirin 81 MG tablet Take 1 tablet by mouth daily.   Calcium-Phosphorus-Vitamin D 100-50-100 MG-MG-UNIT CHEW Chew 1 tablet by mouth.   Coenzyme Q10 100 MG TABS Take 100 mg by mouth daily.   cyanocobalamin 100 MCG tablet Take 1,000 mcg by mouth daily.    diphenhydrAMINE (BENADRYL) 25 MG tablet Take 12.5 mg by mouth at bedtime as needed.   glucosamine-chondroitin 500-400 MG tablet Take 1 tablet by mouth 3 (three) times daily.   hydrocortisone cream 1 % Apply 1 application topically daily.   levothyroxine (SYNTHROID) 88 MCG tablet TAKE 1 TABLET (88 MCG TOTAL) BY MOUTH DAILY BEFORE BREAKFAST.   losartan (COZAAR) 25 MG tablet TAKE 1 TABLET EVERY DAY   MULTIPLE VITAMIN PO Take by mouth daily.   Multiple Vitamins-Minerals (HAIR SKIN AND NAILS FORMULA PO) Take 1 tablet by  mouth daily.   Omega-3 Fatty Acids (FISH OIL BURP-LESS) 1200 MG CAPS Take 1 capsule by mouth.   rosuvastatin (CRESTOR) 20 MG tablet TAKE 1 TABLET EVERY DAY   No facility-administered encounter medications on file as of 04/26/2021.    Allergies (verified) Codeine   History: Past Medical History:  Diagnosis Date   Hyperlipidemia    Hypertension    Thyroid disease    History reviewed. No pertinent surgical history. Family History  Problem Relation Age of Onset   Cancer Mother    Diabetes Father    Stroke Father    Breast cancer Neg Hx    Social History   Socioeconomic History   Marital status: Married    Spouse name: Not on file   Number of children: 0   Years of education: Not on file   Highest education level: High school graduate  Occupational History   Occupation: Retired   Occupation: Armed forces operational officer  Tobacco Use   Smoking status: Former    Packs/day: 0.25    Years: 5.00    Pack years: 1.25    Types: Cigarettes    Start date: 05/01/1972    Quit date: 05/01/1977    Years since quitting: 44.0   Smokeless tobacco: Never  Vaping Use   Vaping Use: Never used  Substance and Sexual Activity   Alcohol use: No   Drug use: No   Sexual activity: Yes    Partners: Male  Other Topics Concern   Not on file  Social History Narrative   Second marriage, widow from first marriage at age 32 and her current husband had two teenagers who she help raise    Social Determinants of Health   Financial Resource Strain: Low Risk    Difficulty of Paying Living Expenses: Not hard at all  Food Insecurity: No Food Insecurity   Worried About Charity fundraiser in the Last Year: Never true   Arboriculturist in the Last Year: Never true  Transportation Needs: No Transportation Needs   Lack of Transportation (Medical): No   Lack of Transportation (Non-Medical): No  Physical Activity: Sufficiently Active   Days of Exercise per Week: 7 days   Minutes of Exercise per Session: 30 min   Stress: No Stress Concern Present   Feeling of Stress : Not at all  Social Connections: Socially Integrated   Frequency of Communication with Friends and Family: More than three times a week   Frequency of Social Gatherings with Friends and Family: More than three times a week   Attends Religious Services: More than 4 times per year   Active Member of [employer] or Organizations: Yes   Attends Music therapist: More than 4 times per year   Marital Status: Married    Tobacco Counseling Counseling given: Not Answered   Clinical Intake:  Pre-visit preparation completed: Yes  Pain : No/denies pain     BMI - recorded: 25.84 Nutritional Status: BMI 25 -29 Overweight Nutritional Risks: None Diabetes: No  How often do you need to have someone help you when you read instructions, pamphlets, or other written materials from your doctor or pharmacy?: 1 - Never    Interpreter Needed?: No  Information entered by :: Clemetine Marker LPN   Activities of Daily Living In your present state of health, do you have any difficulty performing the following activities: 04/26/2021 08/06/2020  Hearing? Y Y  Comment mild -  Vision? N N  Difficulty concentrating or making decisions? N N  Walking or climbing stairs? N N  Dressing or bathing? N N  Doing errands, shopping? N N  Preparing Food and eating ? N -  Using the Toilet? N -  In the past six months, have you accidently leaked urine? Y -  Comment wears pads for protection -  Do you have problems with loss of bowel control? N -  Managing your Medications? N -  Managing your Finances? N -  Housekeeping or managing your Housekeeping? N -  Some recent data might be hidden    Patient Care Team: Steele Sizer, MD as PCP - General (Family Medicine) Birder Robson, MD as Referring Physician (Ophthalmology) Carloyn Manner, MD as Referring Physician (Otolaryngology) Germaine Pomfret, [facility] as Pharmacist (Pharmacist)  Indicate  any recent Medical Services you may have received from other than [facility] providers in the past year (date may be approximate).     Assessment:   This is a routine wellness examination for Yomaira.  Hearing/Vision screen Hearing Screening - Comments:: Pt c/o mild hearing difficulty occasionally  Vision Screening - Comments:: Annual vision screenings done at [facility] issues and exercise activities discussed: Current Exercise Habits: Home exercise routine, Type of exercise: walking, Time (Minutes): 30, Frequency (Times/Week): 7, Weekly Exercise (Minutes/Week): 210, Intensity: Moderate, Exercise limited by: None identified   Goals Addressed             This Visit's Progress  Increase water intake   On track    Recommend increasing water intake to 6-8 glasses a day.      Track and Manage My Blood Pressure-Hypertension   On track    Timeframe:  Long-Range Goal Priority:  High Start Date: 07/14/2020                            Expected End Date: 01/12/2022                      Follow Up within 90 days    - check blood pressure weekly    Why is this important?   You won't feel high blood pressure, but it can still hurt your blood vessels.  High blood pressure can cause heart or kidney problems. It can also cause a stroke.  Making lifestyle changes like losing a little weight or eating less salt will help.  Checking your blood pressure at home and at different times of the day can help to control blood pressure.  If the doctor prescribes medicine remember to take it the way the doctor ordered.  Call the office if you cannot afford the medicine or if there are questions about it.     Notes:        Depression Screen PHQ 2/9 Scores 04/26/2021 08/06/2020 05/26/2020 03/04/2020 02/05/2020 02/05/2020 01/23/2020  PHQ - 2 Score 0 0 2 1 1 1  0  PHQ- 9 Score - - 2 3 3  - -    Fall Risk Fall Risk  04/26/2021 08/06/2020 05/26/2020 03/04/2020 02/05/2020  Falls in the past year? 0 0 0  0 0  Comment - - - - -  Number falls in past yr: 0 0 0 0 0  Comment - - - - -  Injury with Fall? 0 0 0 0 0  Risk for fall due to : No Fall Risks - - No Fall Risks -  Follow up Falls prevention discussed - - Falls prevention discussed Falls evaluation completed    FALL RISK PREVENTION PERTAINING TO THE HOME:  Any stairs in or around the home? Yes  If so, are there any without handrails? No  Home free of loose throw rugs in walkways, pet beds, electrical cords, etc? Yes  Adequate lighting in your home to reduce risk of falls? Yes   ASSISTIVE DEVICES UTILIZED TO PREVENT FALLS:  Life alert? No  Use of a cane, walker or w/c? No  Grab bars in the bathroom? Yes  Shower chair or bench in shower? No  Elevated toilet seat or a handicapped toilet? No   TIMED UP AND GO:  Was the test performed? Yes .  Length of time to ambulate 10 feet: 5 sec.   Gait steady and fast without use of assistive device  Cognitive Function: Normal cognitive status assessed by direct observation by this Nurse Health Advisor. No abnormalities found.       6CIT Screen 03/04/2020 02/26/2018 11/20/2016  What Year? 0 points 0 points 0 points  What month? 0 points 0 points 0 points  What time? 0 points 0 points 0 points  Count back from 20 0 points 0 points 0 points  Months in reverse 0 points 0 points 0 points  Repeat phrase 0 points 0 points 0 points  Total Score 0 0 0    Immunizations Immunization History  Administered Date(s) Administered   Fluad Quad(high Dose 65+) 02/11/2019, 01/23/2020  Influenza, High Dose Seasonal PF 03/16/2015, 02/26/2018   Influenza-Unspecified 03/16/2017, 02/17/2021   PFIZER(Purple Top)SARS-COV-2 Vaccination 06/21/2019, 07/15/2019, 03/31/2020   Pneumococcal Conjugate-13 11/20/2016   Pneumococcal Polysaccharide-23 05/01/2012, 02/26/2018    TDAP status: Due, Education has been provided regarding the importance of this vaccine. Advised may receive this vaccine at local pharmacy  or Health Dept. Aware to provide a copy of the vaccination record if obtained from local pharmacy or Health Dept. Verbalized acceptance and understanding.  Flu Vaccine status: Up to date  Pneumococcal vaccine status: Up to date  Covid-19 vaccine status: Completed vaccines  Qualifies for Shingles Vaccine? Yes   Zostavax completed No   Shingrix Completed?: No.    Education has been provided regarding the importance of this vaccine. Patient has been advised to call insurance company to determine out of pocket expense if they have not yet received this vaccine. Advised may also receive vaccine at local pharmacy or Health Dept. Verbalized acceptance and understanding.  Screening Tests Health Maintenance  Topic Date Due   Zoster Vaccines- Shingrix (1 of 2) Never done   COVID-19 Vaccine (4 - Booster for Pfizer series) 05/26/2020   MAMMOGRAM  09/14/2021   DEXA SCAN  09/15/2022   Fecal DNA (Cologuard)  09/30/2023   Pneumonia Vaccine 57+ Years old  Completed   INFLUENZA VACCINE  Completed   Hepatitis C Screening  Completed   HPV VACCINES  Aged Out   TETANUS/TDAP  Discontinued    Health Maintenance  Health Maintenance Due  Topic Date Due   Zoster Vaccines- Shingrix (1 of 2) Never done   COVID-19 Vaccine (4 - Booster for Pfizer series) 05/26/2020    Colorectal cancer screening: Type of screening: Cologuard. Completed 09/29/20. Repeat every 3 years  Mammogram status: Completed 09/14/20. Repeat every year  Bone Density status: Completed 09/14/20. Results reflect: Bone density results: OSTEOPENIA. Repeat every 2 years.  Lung Cancer Screening: (Low Dose CT Chest recommended if Age 47-80 years, 30 pack-year currently smoking OR have quit w/in 15years.) does not qualify.    Additional Screening:  Hepatitis C Screening: does qualify; Completed 09/06/17  Vision Screening: Recommended annual ophthalmology exams for early detection of glaucoma and other disorders of the eye. Is the patient up to  date with their annual eye exam?  Yes  Who is the provider or what is the name of the office in which the patient attends annual eye exams? [facility].   Dental Screening: Recommended annual dental exams for proper oral hygiene  Community Resource Referral / Chronic Care Management: CRR required this visit?  No   CCM required this visit?  No      Plan:     I have personally reviewed and noted the following in the patients chart:   Medical and social history Use of alcohol, tobacco or illicit drugs  Current medications and supplements including opioid prescriptions.  Functional ability and status Nutritional status Physical activity Advanced directives List of other physicians Hospitalizations, surgeries, and ER visits in previous 12 months Vitals Screenings to include cognitive, depression, and falls Referrals and appointments  In addition, I have reviewed and discussed with patient certain preventive protocols, quality metrics, and best practice recommendations. A written personalized care plan for preventive services as well as general preventive health recommendations were provided to patient.     Clemetine Marker, LPN   [id number]   Nurse Notes: pt states she needs labs checked for TSH; advised to schedule appt with Dr. Ancil Boozer for follow up.

## 2021-04-26 NOTE — Patient Instructions (Signed)
[name] , Thank you for taking time to come for your Medicare Wellness Visit. I appreciate your ongoing commitment to your health goals. Please review the following plan we discussed and let me know if I can assist you in the future.   Screening recommendations/referrals: Colonoscopy: Cologuard done 09/29/20; repeat 09/2023 Mammogram: done 09/14/20 Bone Density: done 09/14/20 Recommended yearly ophthalmology/optometry visit for glaucoma screening and checkup Recommended yearly dental visit for hygiene and checkup  Vaccinations: Influenza vaccine: done 02/17/21 Pneumococcal vaccine: done 02/26/18 Tdap vaccine: due Shingles vaccine: Shingrix discussed. Please contact your pharmacy for coverage information.  Covid-19:done 06/21/19, 07/15/19 & 03/31/20  Advanced directives: Please bring a copy of your health care power of attorney and living will to the office at your convenience.   Conditions/risks identified: Keep up the great work!  Next appointment: Follow up in one year for your annual wellness visit    Preventive Care 65 Years and Older, Female Preventive care refers to lifestyle choices and visits with your health care provider that can promote health and wellness. What does preventive care include? A yearly physical exam. This is also called an annual well check. Dental exams once or twice a year. Routine eye exams. Ask your health care provider how often you should have your eyes checked. Personal lifestyle choices, including: Daily care of your teeth and gums. Regular physical activity. Eating a healthy diet. Avoiding tobacco and drug use. Limiting alcohol use. Practicing safe sex. Taking low-dose aspirin every day. Taking vitamin and mineral supplements as recommended by your health care provider. What happens during an annual well check? The services and screenings done by your health care provider during your annual well check will depend on your age, overall  health, lifestyle risk factors, and family history of disease. Counseling  Your health care provider may ask you questions about your: Alcohol use. Tobacco use. Drug use. Emotional well-being. Home and relationship well-being. Sexual activity. Eating habits. History of falls. Memory and ability to understand (cognition). Work and work Statistician. Reproductive health. Screening  You may have the following tests or measurements: Height, weight, and BMI. Blood pressure. Lipid and cholesterol levels. These may be checked every 5 years, or more frequently if you are over 22 years old. Skin check. Lung cancer screening. You may have this screening every year starting at age 65 if you have a 30-pack-year history of smoking and currently smoke or have quit within the past 15 years. Fecal occult blood test (FOBT) of the stool. You may have this test every year starting at age 81. Flexible sigmoidoscopy or colonoscopy. You may have a sigmoidoscopy every 5 years or a colonoscopy every 10 years starting at age 77. Hepatitis C blood test. Hepatitis B blood test. Sexually transmitted disease (STD) testing. Diabetes screening. This is done by checking your blood sugar (glucose) after you have not eaten for a while (fasting). You may have this done every 1-3 years. Bone density scan. This is done to screen for osteoporosis. You may have this done starting at age 27. Mammogram. This may be done every 1-2 years. Talk to your health care provider about how often you should have regular mammograms. Talk with your health care provider about your test results, treatment options, and if necessary, the need for more tests. Vaccines  Your health care provider may recommend certain vaccines, such as: Influenza vaccine. This is recommended every year. Tetanus, diphtheria, and acellular pertussis (Tdap, Td) vaccine. You may need a Td booster every 10 years.  Zoster vaccine. You may need this after age  54. Pneumococcal 13-valent conjugate (PCV13) vaccine. One dose is recommended after age 55. Pneumococcal polysaccharide (PPSV23) vaccine. One dose is recommended after age 65. Talk to your health care provider about which screenings and vaccines you need and how often you need them. This information is not intended to replace advice given to you by your health care provider. Make sure you discuss any questions you have with your health care provider. Document Released: 05/14/2015 Document Revised: 01/05/2016 Document Reviewed: 02/16/2015 Elsevier Interactive Patient Education  2017 [employer] Prevention in the Home Falls can cause injuries. They can happen to people of all ages. There are many things you can do to make your home safe and to help prevent falls. What can I do on the outside of my home? Regularly fix the edges of walkways and driveways and fix any cracks. Remove anything that might make you trip as you walk through a door, such as a raised step or threshold. Trim any bushes or trees on the path to your home. Use bright outdoor lighting. Clear any walking paths of anything that might make someone trip, such as rocks or tools. Regularly check to see if handrails are loose or broken. Make sure that both sides of any steps have handrails. Any raised decks and porches should have guardrails on the edges. Have any leaves, snow, or ice cleared regularly. Use sand or salt on walking paths during winter. Clean up any spills in your garage right away. This includes oil or grease spills. What can I do in the bathroom? Use night lights. Install grab bars by the toilet and in the tub and shower. Do not use towel bars as grab bars. Use non-skid mats or decals in the tub or shower. If you need to sit down in the shower, use a plastic, non-slip stool. Keep the floor dry. Clean up any water that spills on the floor as soon as it happens. Remove soap buildup in the tub or shower  regularly. Attach bath mats securely with double-sided non-slip rug tape. Do not have throw rugs and other things on the floor that can make you trip. What can I do in the bedroom? Use night lights. Make sure that you have a light by your bed that is easy to reach. Do not use any sheets or blankets that are too big for your bed. They should not hang down onto the floor. Have a firm chair that has side arms. You can use this for support while you get dressed. Do not have throw rugs and other things on the floor that can make you trip. What can I do in the kitchen? Clean up any spills right away. Avoid walking on wet floors. Keep items that you use a lot in easy-to-reach places. If you need to reach something above you, use a strong step stool that has a grab bar. Keep electrical cords out of the way. Do not use floor polish or wax that makes floors slippery. If you must use wax, use non-skid floor wax. Do not have throw rugs and other things on the floor that can make you trip. What can I do with my stairs? Do not leave any items on the stairs. Make sure that there are handrails on both sides of the stairs and use them. Fix handrails that are broken or loose. Make sure that handrails are as long as the stairways. Check any carpeting to make sure that  it is firmly attached to the stairs. Fix any carpet that is loose or worn. Avoid having throw rugs at the top or bottom of the stairs. If you do have throw rugs, attach them to the floor with carpet tape. Make sure that you have a light switch at the top of the stairs and the bottom of the stairs. If you do not have them, ask someone to add them for you. What else can I do to help prevent falls? Wear shoes that: Do not have high heels. Have rubber bottoms. Are comfortable and fit you well. Are closed at the toe. Do not wear sandals. If you use a stepladder: Make sure that it is fully opened. Do not climb a closed stepladder. Make sure that  both sides of the stepladder are locked into place. Ask someone to hold it for you, if possible. Clearly mark and make sure that you can see: Any grab bars or handrails. First and last steps. Where the edge of each step is. Use tools that help you move around (mobility aids) if they are needed. These include: Canes. Walkers. Scooters. Crutches. Turn on the lights when you go into a dark area. Replace any light bulbs as soon as they burn out. Set up your furniture so you have a clear path. Avoid moving your furniture around. If any of your floors are uneven, fix them. If there are any pets around you, be aware of where they are. Review your medicines with your doctor. Some medicines can make you feel dizzy. This can increase your chance of falling. Ask your doctor what other things that you can do to help prevent falls. This information is not intended to replace advice given to you by your health care provider. Make sure you discuss any questions you have with your health care provider. Document Released: 02/11/2009 Document Revised: 09/23/2015 Document Reviewed: 05/22/2014 Elsevier Interactive Patient Education  2017 [employer].

## 2021-05-10 ENCOUNTER — Encounter: Payer: Self-pay | Admitting: Family Medicine

## 2021-05-11 ENCOUNTER — Ambulatory Visit: Payer: Medicare HMO

## 2021-05-11 VITALS — BP 150/64

## 2021-05-11 DIAGNOSIS — I10 Essential (primary) hypertension: Secondary | ICD-10-CM

## 2021-05-11 NOTE — Progress Notes (Signed)
Patient is here for blood pressure check due to being high at dentist yesterday.  She is currently on losartan 12.5mg .  Patients blood pressure today is 150/64.  Please advise, she states has just currently started working so it may be due to some stress.

## 2021-05-19 ENCOUNTER — Ambulatory Visit: Payer: Medicare HMO

## 2021-05-19 ENCOUNTER — Telehealth: Payer: Self-pay

## 2021-05-19 ENCOUNTER — Other Ambulatory Visit: Payer: Self-pay | Admitting: Family Medicine

## 2021-05-19 VITALS — BP 126/74

## 2021-05-19 DIAGNOSIS — I10 Essential (primary) hypertension: Secondary | ICD-10-CM

## 2021-05-19 DIAGNOSIS — E039 Hypothyroidism, unspecified: Secondary | ICD-10-CM

## 2021-05-19 DIAGNOSIS — E7800 Pure hypercholesterolemia, unspecified: Secondary | ICD-10-CM

## 2021-05-19 NOTE — Progress Notes (Signed)
Patient here for 1 week blood pressure check since increasing Losartan to 25mg .  She denies any side effects and states blood pressure at home has been running good.  Blood pressure today is 126/74.  Patient was told to continue current dose unless otherwise directed.

## 2021-05-19 NOTE — Telephone Encounter (Signed)
Requested Prescriptions  Pending Prescriptions Disp Refills   losartan (COZAAR) 25 MG tablet [Pharmacy Med Name: LOSARTAN POTASSIUM 25 MG Tablet] 90 tablet 0    Sig: TAKE 1 TABLET EVERY DAY     Cardiovascular:  Angiotensin Receptor Blockers Failed - 05/19/2021  5:16 AM      Failed - Cr in normal range and within 180 days    Creat  Date Value Ref Range Status  08/06/2020 0.87 0.60 - 0.93 mg/dL Final    Comment:    For patients >74 years of age, the reference limit for Creatinine is approximately 13% higher for people identified as African-American. .          Failed - K in normal range and within 180 days    Potassium  Date Value Ref Range Status  08/06/2020 3.9 3.5 - 5.3 mmol/L Final         Failed - Last BP in normal range    BP Readings from Last 1 Encounters:  05/11/21 (!) 150/64         Passed - Patient is not pregnant      Passed - Valid encounter within last 6 months    Recent Outpatient Visits          9 months ago Pure hypercholesterolemia   [facility] Steele Sizer, MD   11 months ago Nasal congestion   [facility] Steele Sizer, MD   1 year ago Essential hypertension   [facility] Steele Sizer, MD   1 year ago Essential hypertension   [facility] Delsa Grana, PA-C   1 year ago Daytime sleepiness   [facility] Delsa Grana, [place]      Future Appointments            In 1 week Steele Sizer, MD [facility], [place]   In 11 months  [facility]            levothyroxine (SYNTHROID) 88 MCG tablet [Pharmacy Med Name: LEVOTHYROXINE SODIUM 88 MCG Tablet] 90 tablet 1    Sig: TAKE 1 TABLET EVERY Price     Endocrinology:  Hypothyroid Agents Failed - 05/19/2021  5:16 AM      Failed - TSH needs to be rechecked within 3 months after an abnormal result. Refill until TSH is due.      Passed - TSH in  normal range and within 360 days    TSH  Date Value Ref Range Status  10/04/2020 1.06 0.40 - 4.50 mIU/L Final         Passed - Valid encounter within last 12 months    Recent Outpatient Visits          9 months ago Pure hypercholesterolemia   [facility] Steele Sizer, MD   11 months ago Nasal congestion   [facility] Steele Sizer, MD   1 year ago Essential hypertension   [facility] Steele Sizer, MD   1 year ago Essential hypertension   [facility] Delsa Grana, PA-C   1 year ago Daytime sleepiness   [facility] Delsa Grana, [place]      Future Appointments            In 1 week Steele Sizer, MD [facility], [place]   In 11 months  [facility], [facility]  rosuvastatin (CRESTOR) 20 MG tablet [Pharmacy Med Name: ROSUVASTATIN CALCIUM 20 MG Tablet] 90 tablet 0    Sig: TAKE 1 TABLET EVERY DAY     Cardiovascular:  Antilipid - Statins Passed - 05/19/2021  5:16 AM      Passed - Total Cholesterol in normal range and within 360 days    Cholesterol, Total  Date Value Ref Range Status  09/07/2016 149 100 - 199 mg/dL Final   Cholesterol  Date Value Ref Range Status  08/06/2020 137 <200 mg/dL Final         Passed - LDL in normal range and within 360 days    LDL Cholesterol (Calc)  Date Value Ref Range Status  08/06/2020 71 mg/dL (calc) Final    Comment:    Reference range: <100 . Desirable range <100 mg/dL for primary prevention;   <70 mg/dL for patients with CHD or diabetic patients  with > or = 2 CHD risk factors. Marland Kitchen LDL-C is now calculated using the Martin-Hopkins  calculation, which is a validated novel method providing  better accuracy than the Friedewald equation in the  estimation of LDL-C.  Cresenciano Genre et al. Annamaria Helling. 3419;622(29): 2061-2068  (http://education.[employer].com/faq/FAQ164)          Passed - HDL in  normal range and within 360 days    HDL  Date Value Ref Range Status  08/06/2020 50 > OR = 50 mg/dL Final  09/07/2016 48 >39 mg/dL Final         Passed - Triglycerides in normal range and within 360 days    Triglycerides  Date Value Ref Range Status  08/06/2020 82 <150 mg/dL Final         Passed - Patient is not pregnant      Passed - Valid encounter within last 12 months    Recent Outpatient Visits          9 months ago Pure hypercholesterolemia   [facility] Steele Sizer, MD   11 months ago Nasal congestion   [facility] Steele Sizer, MD   1 year ago Essential hypertension   [facility] Steele Sizer, MD   1 year ago Essential hypertension   [facility] Delsa Grana, PA-C   1 year ago Daytime sleepiness   [facility] Delsa Grana, [place]      Future Appointments            In 1 week Steele Sizer, MD [facility], [place]   In 11 months  [facility], [facility]

## 2021-05-19 NOTE — Telephone Encounter (Signed)
Form was filled out and faxed back with confirmation.

## 2021-05-19 NOTE — Telephone Encounter (Signed)
Copied from [place] [id number]. Topic: General - Other >> May 18, 2021  4:55 PM Antonieta Iba C wrote: Reason for CRM: pt dentist office called in to verify if provider has received pt's medical clearance form?    Phone: [phone] - (anyone can assist)

## 2021-05-26 NOTE — Telephone Encounter (Signed)
Lexie with [place] called in to say that they have not received medical clearance form from 05/19/21 and it is needed for patient to be served. Asking for a call back when it is faxed to number provided.  Ph# [phone] and Fax# [phone]

## 2021-05-30 NOTE — Progress Notes (Signed)
Name: Paige [name]   MRN: [id number]    DOB: [birth year]   Date:05/31/2021       Progress Note  Subjective  Chief Complaint  Follow Up  HPI  Hyperlipidemia: she is now on Rosuvastatin in place of Pravastatin , she denies myalgia , we will recheck labs today  HTN: She had stopped taking losartan since she lost weight , she was on half pill for months but went to the dentist and bp spiked to 180's , she has been taking full tablet since and bp has been at goal in the 120-130's/70's range. No chest pain, palpitation or sob Denies dizziness.    Hypothyroidism: she has been on levothyroxine for 88 mcg for many years. TSH has been at goal for a long time, last TSH was suppressed, currently taking one daily and half on Sundays we will recheck level   OSA : diagnosed by Dr. Humphrey Rolls , she was on CPAP but is doing well since weight loss and has been off CPAP machine, however she has PLM and RLS and he suggested checking ferritin level.   Overweight: she joined weight [employer] in October 2021 her original weight 184 lbs she is down to 153 lbs. She is a little worried because she has increased her points per day but continues to lose weight. She is also more stressed because husband will have bypass surgery soon and also working part time and has been more active, also walking her dog two to three times a week.    Patient Active Problem List   Diagnosis Date Noted   OSA (obstructive sleep apnea) 02/05/2020   Essential hypertension 08/28/2018   Osteopenia 08/28/2018   Hyperlipidemia 10/27/2014   Allergic rhinitis 10/27/2014   Adult hypothyroidism 10/13/2014    No past surgical history on file.  Family History  Problem Relation Age of Onset   Cancer Mother    Diabetes Father    Stroke Father    Breast cancer Neg Hx     Social History   Tobacco Use   Smoking status: Former    Packs/day: 0.25    Years: 5.00    Pack years: 1.25    Types: Cigarettes    Start date: 05/01/1972    Quit  date: 05/01/1977    Years since quitting: 44.1   Smokeless tobacco: Never  Substance Use Topics   Alcohol use: No     Current Outpatient Medications:    aspirin 81 MG tablet, Take 1 tablet by mouth daily., Disp: , Rfl:    Calcium-Phosphorus-Vitamin D 100-50-100 MG-MG-UNIT CHEW, Chew 1 tablet by mouth., Disp: , Rfl:    Coenzyme Q10 100 MG TABS, Take 100 mg by mouth daily., Disp: , Rfl:    cyanocobalamin 100 MCG tablet, Take 1,000 mcg by mouth daily. , Disp: , Rfl:    diazepam (VALIUM) 5 MG tablet, Take 1 tablet (5 mg total) by mouth once for 1 dose. 30 minutes  prior to procedure and may repeat times one right before, Disp: 2 tablet, Rfl: 0   diphenhydrAMINE (BENADRYL) 25 MG tablet, Take 12.5 mg by mouth at bedtime as needed., Disp: , Rfl:    glucosamine-chondroitin 500-400 MG tablet, Take 1 tablet by mouth 3 (three) times daily., Disp: , Rfl:    levothyroxine (SYNTHROID) 88 MCG tablet, TAKE 1 TABLET EVERY DAY BEFORE BREAKFAST, Disp: 90 tablet, Rfl: 1   losartan (COZAAR) 25 MG tablet, TAKE 1 TABLET EVERY DAY, Disp: 90 tablet, Rfl: 0   MULTIPLE VITAMIN  PO, Take by mouth daily., Disp: , Rfl:    Multiple Vitamins-Minerals (HAIR SKIN AND NAILS FORMULA PO), Take 1 tablet by mouth daily., Disp: , Rfl:    Omega-3 Fatty Acids (FISH OIL BURP-LESS) 1200 MG CAPS, Take 1 capsule by mouth., Disp: , Rfl:    rosuvastatin (CRESTOR) 20 MG tablet, TAKE 1 TABLET EVERY DAY, Disp: 90 tablet, Rfl: 0   Zoster Vaccine Adjuvanted (SHINGRIX) injection, Inject 0.5 mLs into the muscle once for 1 dose., Disp: 0.5 mL, Rfl: 1  Allergies  Allergen Reactions   Codeine Nausea And Vomiting    I personally reviewed active problem list, medication list, allergies, family history, social history, health maintenance with the patient/caregiver today.   ROS  Constitutional: Negative for fever , positive for  weight change.  Respiratory: Negative for cough and shortness of breath.   Cardiovascular: Negative for chest pain or  palpitations.  Gastrointestinal: Negative for abdominal pain, no bowel changes.  Musculoskeletal: Negative for gait problem or joint swelling.  Skin: Negative for rash.  Neurological: Negative for dizziness or headache.  No other specific complaints in a complete review of systems (except as listed in HPI above).   Objective  Vitals:   05/31/21 0920  BP: 136/68  Pulse: 72  Resp: 16  SpO2: 99%  Weight: 153 lb (69.4 kg)  Height: 5\' 5"  (1.651 m)    Body mass index is 25.46 kg/m.  Physical Exam  Constitutional: Patient appears well-developed and well-nourished. Obese  No distress.  HEENT: head atraumatic, normocephalic, pupils equal and reactive to light, neck supple Cardiovascular: Normal rate, regular rhythm and normal heart sounds.  No murmur heard. No BLE edema. Pulmonary/Chest: Effort normal and breath sounds normal. No respiratory distress. Abdominal: Soft.  There is no tenderness. Psychiatric: Patient has a normal mood and affect. behavior is normal. Judgment and thought content normal.    PHQ2/9: Depression screen [facility] 2/9 05/31/2021 04/26/2021 08/06/2020 05/26/2020 03/04/2020  Decreased Interest 0 0 0 1 0  Down, Depressed, Hopeless 0 0 0 1 1  PHQ - 2 Score 0 0 0 2 1  Altered sleeping 0 - - 0 1  Tired, decreased energy 0 - - 0 1  Change in appetite 0 - - 0 0  Feeling bad or failure about yourself  0 - - 0 0  Trouble concentrating 0 - - 0 0  Moving slowly or fidgety/restless 0 - - 0 0  Suicidal thoughts 0 - - 0 0  PHQ-9 Score 0 - - 2 3  Difficult doing work/chores - - - Not difficult at all Not difficult at all  Some recent data might be hidden    phq 9 is negative   Fall Risk: Fall Risk  05/31/2021 04/26/2021 08/06/2020 05/26/2020 03/04/2020  Falls in the past year? 0 0 0 0 0  Comment - - - - -  Number falls in past yr: 0 0 0 0 0  Comment - - - - -  Injury with Fall? 0 0 0 0 0  Risk for fall due to : No Fall Risks No Fall Risks - - No Fall Risks  Follow up Falls  prevention discussed Falls prevention discussed - - Falls prevention discussed      Functional Status Survey: Is the patient deaf or have difficulty hearing?: Yes Does the patient have difficulty seeing, even when wearing glasses/contacts?: No Does the patient have difficulty concentrating, remembering, or making decisions?: No Does the patient have difficulty walking or climbing stairs?: No  Does the patient have difficulty dressing or bathing?: No Does the patient have difficulty doing errands alone such as visiting a doctor's office or shopping?: No    Assessment & Plan  1. Adult hypothyroidism  - TSH  2. Essential hypertension  - CBC with Differential/Platelet - COMPLETE METABOLIC PANEL WITH GFR - diazepam (VALIUM) 5 MG tablet; Take 1 tablet (5 mg total) by mouth once for 1 dose. 30 minutes  prior to procedure and may repeat times one right before  Dispense: 2 tablet; Refill: 0  3. OSA (obstructive sleep apnea)   4. Pure hypercholesterolemia  - Lipid panel  5. Weight loss  - C-reactive protein - Sedimentation rate  6. RLS (restless legs syndrome)  - Ferritin  7. Need for shingles vaccine  - Zoster Vaccine Adjuvanted [facility]) injection; Inject 0.5 mLs into the muscle once for 1 dose.  Dispense: 0.5 mL; Refill: 1  8. Osteopenia of neck of left femur  - VITAMIN D 25 Hydroxy (Vit-D Deficiency, Fractures)

## 2021-05-31 ENCOUNTER — Ambulatory Visit (INDEPENDENT_AMBULATORY_CARE_PROVIDER_SITE_OTHER): Payer: Medicare HMO | Admitting: Family Medicine

## 2021-05-31 ENCOUNTER — Encounter: Payer: Self-pay | Admitting: Family Medicine

## 2021-05-31 VITALS — BP 136/68 | HR 72 | Resp 16 | Ht 65.0 in | Wt 153.0 lb

## 2021-05-31 DIAGNOSIS — E039 Hypothyroidism, unspecified: Secondary | ICD-10-CM

## 2021-05-31 DIAGNOSIS — M85852 Other specified disorders of bone density and structure, left thigh: Secondary | ICD-10-CM | POA: Diagnosis not present

## 2021-05-31 DIAGNOSIS — G4733 Obstructive sleep apnea (adult) (pediatric): Secondary | ICD-10-CM | POA: Diagnosis not present

## 2021-05-31 DIAGNOSIS — G2581 Restless legs syndrome: Secondary | ICD-10-CM | POA: Diagnosis not present

## 2021-05-31 DIAGNOSIS — I10 Essential (primary) hypertension: Secondary | ICD-10-CM

## 2021-05-31 DIAGNOSIS — M8580 Other specified disorders of bone density and structure, unspecified site: Secondary | ICD-10-CM | POA: Diagnosis not present

## 2021-05-31 DIAGNOSIS — D649 Anemia, unspecified: Secondary | ICD-10-CM | POA: Diagnosis not present

## 2021-05-31 DIAGNOSIS — E7800 Pure hypercholesterolemia, unspecified: Secondary | ICD-10-CM

## 2021-05-31 DIAGNOSIS — R634 Abnormal weight loss: Secondary | ICD-10-CM | POA: Diagnosis not present

## 2021-05-31 DIAGNOSIS — Z23 Encounter for immunization: Secondary | ICD-10-CM | POA: Diagnosis not present

## 2021-05-31 MED ORDER — SHINGRIX 50 MCG/0.5ML IM SUSR: 0.5000 mL | Freq: Once | INTRAMUSCULAR | 1 refills | Status: AC

## 2021-05-31 MED ORDER — DIAZEPAM 5 MG PO TABS: 5.0000 mg | ORAL_TABLET | Freq: Once | ORAL | 0 refills | Status: AC

## 2021-06-01 LAB — CBC WITH DIFFERENTIAL/PLATELET
Absolute Monocytes: 300 cells/uL (ref 200–950)
Basophils Absolute: 20 cells/uL (ref 0–200)
Basophils Relative: 0.4 %
Eosinophils Absolute: 90 cells/uL (ref 15–500)
Eosinophils Relative: 1.8 %
HCT: 40.5 % (ref 35.0–45.0)
Hemoglobin: 13.5 g/dL (ref 11.7–15.5)
Lymphs Abs: 1275 cells/uL (ref 850–3900)
MCH: 31.2 pg (ref 27.0–33.0)
MCHC: 33.3 g/dL (ref 32.0–36.0)
MCV: 93.5 fL (ref 80.0–100.0)
MPV: 11.2 fL (ref 7.5–12.5)
Monocytes Relative: 6 %
Neutro Abs: 3315 cells/uL (ref 1500–7800)
Neutrophils Relative %: 66.3 %
Platelets: 175 10*3/uL (ref 140–400)
RBC: 4.33 10*6/uL (ref 3.80–5.10)
RDW: 12 % (ref 11.0–15.0)
Total Lymphocyte: 25.5 %
WBC: 5 10*3/uL (ref 3.8–10.8)

## 2021-06-01 LAB — LIPID PANEL
Cholesterol: 140 mg/dL (ref <200)
HDL: 45 mg/dL — ABNORMAL LOW (ref >=50)
LDL Cholesterol (Calc): 76 mg/dL (calc)
Non-HDL Cholesterol (Calc): 95 mg/dL (calc) (ref <130)
Total CHOL/HDL Ratio: 3.1 (calc) (ref <5.0)
Triglycerides: 111 mg/dL (ref <150)

## 2021-06-01 LAB — COMPLETE METABOLIC PANEL WITH GFR
AG Ratio: 1.6 (calc) (ref 1.0–2.5)
ALT: 14 U/L (ref 6–29)
AST: 19 U/L (ref 10–35)
Albumin: 4.1 g/dL (ref 3.6–5.1)
Alkaline phosphatase (APISO): 43 U/L (ref 37–153)
BUN: 22 mg/dL (ref 7–25)
CO2: 28 mmol/L (ref 20–32)
Calcium: 9.3 mg/dL (ref 8.6–10.4)
Chloride: 106 mmol/L (ref 98–110)
Creat: 0.92 mg/dL (ref 0.60–1.00)
Globulin: 2.6 g/dL (calc) (ref 1.9–3.7)
Glucose, Bld: 84 mg/dL (ref 65–99)
Potassium: 4.1 mmol/L (ref 3.5–5.3)
Sodium: 142 mmol/L (ref 135–146)
Total Bilirubin: 0.7 mg/dL (ref 0.2–1.2)
Total Protein: 6.7 g/dL (ref 6.1–8.1)
eGFR: 66 mL/min/{1.73_m2} (ref >=60)

## 2021-06-01 LAB — VITAMIN D 25 HYDROXY (VIT D DEFICIENCY, FRACTURES): Vit D, 25-Hydroxy: 54 ng/mL (ref 30–100)

## 2021-06-01 LAB — FERRITIN: Ferritin: 57 ng/mL (ref 16–288)

## 2021-06-01 LAB — TSH: TSH: 0.61 mIU/L (ref 0.40–4.50)

## 2021-06-01 LAB — C-REACTIVE PROTEIN: CRP: 1 mg/L (ref <8.0)

## 2021-06-01 LAB — SEDIMENTATION RATE: Sed Rate: 6 mm/h (ref 0–30)

## 2021-06-07 ENCOUNTER — Ambulatory Visit: Payer: Medicare HMO | Admitting: Internal Medicine

## 2021-07-19 ENCOUNTER — Telehealth: Payer: Self-pay

## 2021-07-19 NOTE — Progress Notes (Signed)
? ? ?  Chronic Care Management ?Pharmacy Assistant  ? ?Name: Paige [name]  MRN: [id number] DOB: 23-Nov-1945 ? ?Patient called to be reminded of her telephone appointment with Junius Argyle, CPP on 07/20/2021 @ 1100 ? ?Patient aware of appointment date, time, and type of appointment (either telephone or in person). Patient aware to have/bring all medications, supplements, blood pressure and/or blood sugar logs to visit. ? ? ?Star Rating Drug: ?Losartan 25 mg last filled on 05/19/2021 for a 90-Day supply with [facility] ?Rosuvastatin 20 mg last filled on 05/19/2021 for a 90-Day supply with [facility] ? ?Any gaps in medications fill history? None ? ?Care Gaps: ?Zoster Vaccine ?COVID-19 Booster 4 ? ?Lynann Bologna, CPA/CMA ?Clinical Pharmacist Assistant ?Phone: [phone]  ? ?

## 2021-07-20 ENCOUNTER — Ambulatory Visit (INDEPENDENT_AMBULATORY_CARE_PROVIDER_SITE_OTHER): Payer: Medicare HMO

## 2021-07-20 DIAGNOSIS — E7800 Pure hypercholesterolemia, unspecified: Secondary | ICD-10-CM

## 2021-07-20 DIAGNOSIS — I10 Essential (primary) hypertension: Secondary | ICD-10-CM

## 2021-07-20 NOTE — Progress Notes (Signed)
? ?Chronic Care Management ?Pharmacy Note ? ?07/26/2021 ?Name:  Paige [name] MRN:  [id number] DOB:  [birth year] ? ?Summary: ?Patient presents for CCM follow-up. Blood pressure well controlled. Patient reports heat sensitivity.  ? ?Recommendations/Changes made from today's visit: ?Continue current medications ? ?Plan: ?No further follow up required: Patient will reach out to clinical team as needed. ? ?Subjective: ?Paige [name] is an 76 y.o. year old female who is a primary patient of Sowles, Drue Stager, MD.  The CCM team was consulted for assistance with disease management and care coordination needs.   ? ?Engaged with patient by telephone for follow up visit in response to provider referral for pharmacy case management and/or care coordination services.  ? ?Consent to Services:  ?The patient was given information about Chronic Care Management services, agreed to services, and gave verbal consent prior to initiation of services.  Please see initial visit note for detailed documentation.  ? ?Patient Care Team: ?Steele Sizer, MD as PCP - General (Family Medicine) ?Birder Robson, MD as Referring Physician (Ophthalmology) ?Carloyn Manner, MD as Referring Physician (Otolaryngology) ?Germaine Pomfret, [facility] as Pharmacist (Pharmacist) ? ?Recent office visits: ?05/31/21: Patient presented to Dr. Ancil Boozer for follow-up. Diazepam.started.  ?04/26/21: Patient presented to Clemetine Marker, LPN for AWV.  ? ?Recent consult visits: ?None noted. ? ?Hospital visits: ?None in previous 6 months ? ?Objective: ? ?Lab Results  ?Component Value Date  ? CREATININE 0.92 05/31/2021  ? BUN 22 05/31/2021  ? GFRNONAA 67 08/06/2020  ? GFRAA 77 08/06/2020  ? NA 142 05/31/2021  ? K 4.1 05/31/2021  ? CALCIUM 9.3 05/31/2021  ? CO2 28 05/31/2021  ? ? ?Lab Results  ?Component Value Date/Time  ? HGBA1C 5.1 08/06/2019 08:28 AM  ?  ?Last diabetic Eye exam: No results found for: HMDIABEYEEXA  ?Last diabetic Foot exam: No results found for:  HMDIABFOOTEX  ? ?Lab Results  ?Component Value Date  ? CHOL 140 05/31/2021  ? HDL 45 (L) 05/31/2021  ? [place] 76 05/31/2021  ? TRIG 111 05/31/2021  ? CHOLHDL 3.1 05/31/2021  ? ? ? ?  Latest Ref Rng & Units 05/31/2021  ? 10:06 AM 08/06/2020  ?  8:41 AM 02/05/2020  ?  8:49 AM  ?Hepatic Function  ?Total Protein 6.1 - 8.1 g/dL 6.7   6.9   7.2    ?AST 10 - 35 U/L '19   17   19    '$ ?ALT 6 - 29 U/L '14   14   15    '$ ?Total Bilirubin 0.2 - 1.2 mg/dL 0.7   0.6   0.7    ?Bilirubin, Direct 0.0 - 0.2 mg/dL   0.2    ? ? ?Lab Results  ?Component Value Date/Time  ? TSH 0.61 05/31/2021 10:06 AM  ? TSH 1.06 10/04/2020 10:16 AM  ? FREET4 1.46 10/13/2014 04:29 PM  ? ? ? ?  Latest Ref Rng & Units 05/31/2021  ? 10:06 AM 08/06/2020  ?  8:41 AM 08/06/2019  ?  8:28 AM  ?CBC  ?WBC 3.8 - 10.8 Thousand/uL 5.0   4.9   5.5    ?Hemoglobin 11.7 - 15.5 g/dL 13.5   14.2   13.6    ?Hematocrit 35.0 - 45.0 % 40.5   43.5   41.5    ?Platelets 140 - 400 Thousand/uL 175   185   181    ? ?Last DEXA Scan: 09/14/2020 ? T-Score femoral neck: -1.5 ? T-Score total hip: -0.9 ? T-Score lumbar spine: +0.4 ?  T-Score forearm radius: NA ? 10-year probability of major osteoporotic fracture: 10.7% ? 10-year probability of hip fracture: 1.9% ? ? ?Lab Results  ?Component Value Date/Time  ? VD25OH 54 05/31/2021 10:06 AM  ? VD25OH 66 08/06/2020 08:41 AM  ? ? ?Clinical ASCVD: No  ?The 10-year ASCVD risk score (Arnett DK, et al., 2019) is: 18.3% ?  Values used to calculate the score: ?    Age: 30 years ?    Sex: Female ?    Is Non-Hispanic African American: No ?    Diabetic: No ?    Tobacco smoker: No ?    Systolic Blood Pressure: 751 mmHg ?    Is BP treated: Yes ?    HDL Cholesterol: 45 mg/dL ?    Total Cholesterol: 140 mg/dL   ? ? ?  05/31/2021  ?  9:19 AM 04/26/2021  ?  2:30 PM 08/06/2020  ?  7:55 AM  ?Depression screen PHQ 2/9  ?Decreased Interest 0 0 0  ?Down, Depressed, Hopeless 0 0 0  ?PHQ - 2 Score 0 0 0  ?Altered sleeping 0    ?Tired, decreased energy 0    ?Change in appetite 0     ?Feeling bad or failure about yourself  0    ?Trouble concentrating 0    ?Moving slowly or fidgety/restless 0    ?Suicidal thoughts 0    ?PHQ-9 Score 0    ?  ? ?Social History  ? ?Tobacco Use  ?Smoking Status Former  ? Packs/day: 0.25  ? Years: 5.00  ? Pack years: 1.25  ? Types: Cigarettes  ? Start date: 05/01/1972  ? Quit date: 05/01/1977  ? Years since quitting: 44.2  ?Smokeless Tobacco Never  ? ?BP Readings from Last 3 Encounters:  ?05/31/21 136/68  ?05/19/21 126/74  ?05/11/21 (!) 150/64  ? ?Pulse Readings from Last 3 Encounters:  ?05/31/21 72  ?04/26/21 66  ?12/07/20 61  ? ?Wt Readings from Last 3 Encounters:  ?05/31/21 153 lb (69.4 kg)  ?04/26/21 155 lb 4.8 oz (70.4 kg)  ?12/07/20 162 lb 9.6 oz (73.8 kg)  ? ? ?Assessment/Interventions: Review of patient past medical history, allergies, medications, health status, including review of consultants reports, laboratory and other test data, was performed as part of comprehensive evaluation and provision of chronic care management services.  ? ?SDOH:  (Social Determinants of Health) assessments and interventions performed: Yes ? ? ? ?CCM Care Plan ? ?Allergies  ?Allergen Reactions  ? Codeine Nausea And Vomiting  ? ? ?Medications Reviewed Today   ? ? Reviewed by Carlene Coria, [place] (Certified Medical Assistant) on 05/31/21 at [place] List Status: <None>  ? ?Medication Order Taking? Sig Documenting Provider Last Dose Status Informant  ?aspirin 81 MG tablet [id number] Yes Take 1 tablet by mouth daily. Roselee Nova, MD Taking Active   ?         ?Med Note Alleen Borne   Tue Mar 04, 2019  1:57 PM)    ?Calcium-Phosphorus-Vitamin D 242-35-361 MG-MG-UNIT CHEW [id number] Yes Chew 1 tablet by mouth. Roselee Nova, MD Taking Active   ?         ?Med Note Alleen Borne   Tue Mar 04, 2019  1:57 PM)    ?Coenzyme Q10 100 MG TABS [id number] Yes Take 100 mg by mouth daily. [provider] Taking Active   ?cyanocobalamin 100 MCG tablet [id number] Yes Take 1,000 mcg by  mouth daily.  [provider] Taking  Active Self  ?diphenhydrAMINE (BENADRYL) 25 MG tablet [id number] Yes Take 12.5 mg by mouth at bedtime as needed. [provider] Taking Active   ?glucosamine-chondroitin 500-400 MG tablet [id number] Yes Take 1 tablet by mouth 3 (three) times daily. [provider] Taking Active   ?hydrocortisone cream 1 % [id number] Yes Apply 1 application topically daily. Homero Fellers, MD Taking Active   ?levothyroxine (SYNTHROID) 88 MCG tablet [id number] Yes TAKE 1 TABLET EVERY DAY BEFORE [place], Drue Stager, MD Taking Active   ?losartan (COZAAR) 25 MG tablet [id number] Yes TAKE 1 TABLET EVERY DAY Steele Sizer, MD Taking Active   ?MULTIPLE VITAMIN PO [id number] Yes Take by mouth daily. Roselee Nova, MD Taking Active   ?         ?Med Note Clemetine Marker D   Tue Mar 04, 2019  1:58 PM)    ?Multiple Vitamins-Minerals (HAIR SKIN AND NAILS FORMULA PO) [id number] Yes Take 1 tablet by mouth daily. [provider] Taking Active   ?Omega-3 Fatty Acids (FISH OIL BURP-LESS) 1200 MG CAPS [id number] Yes Take 1 capsule by mouth. Roselee Nova, MD Taking Active   ?         ?Med Note Clemetine Marker D   Tue Mar 04, 2019  1:58 PM)    ?rosuvastatin (CRESTOR) 20 MG tablet [id number] Yes TAKE 1 TABLET EVERY DAY Steele Sizer, MD Taking Active   ? ?  ?  ? ?  ? ? ?Patient Active Problem List  ? Diagnosis Date Noted  ? OSA (obstructive sleep apnea) 02/05/2020  ? Essential hypertension 08/28/2018  ? Osteopenia 08/28/2018  ? Hyperlipidemia 10/27/2014  ? Allergic rhinitis 10/27/2014  ? Adult hypothyroidism 10/13/2014  ? ? ?Immunization History  ?Administered Date(s) Administered  ? Fluad Quad(high Dose 65+) 02/11/2019, 01/23/2020  ? Influenza, High Dose Seasonal PF 03/16/2015, 02/26/2018  ? Influenza-Unspecified 03/16/2017, 02/17/2021  ? PFIZER(Purple Top)SARS-COV-2 Vaccination 06/21/2019, 07/15/2019, 03/31/2020  ? Pneumococcal Conjugate-13 11/20/2016  ? Pneumococcal  Polysaccharide-23 05/01/2012, 02/26/2018  ? ? ?Conditions to be addressed/monitored:  ?Hypertension, Hyperlipidemia, Hypothyroidism and Osteopenia ? ?Care Plan : General Pharmacy (Adult)  ?Updates made by [facility]

## 2021-07-26 NOTE — Patient Instructions (Signed)
Visit Information ?It was great speaking with you today!  Please let me know if you have any questions about our visit. ? ? Goals Addressed   ? ?  ?  ?  ?  ? This Visit's Progress  ?  Track and Manage My Blood Pressure-Hypertension   On track  ?  Timeframe:  Long-Range Goal ?Priority:  High ?Start Date: 07/14/2020                            ?Expected End Date:07/26/2021                    ? ?Follow Up as needed  ?  ?- check blood pressure weekly  ?  ?Why is this important?   ?You won't feel high blood pressure, but it can still hurt your blood vessels.  ?High blood pressure can cause heart or kidney problems. It can also cause a stroke.  ?Making lifestyle changes like losing a little weight or eating less salt will help.  ?Checking your blood pressure at home and at different times of the day can help to control blood pressure.  ?If the doctor prescribes medicine remember to take it the way the doctor ordered.  ?Call the office if you cannot afford the medicine or if there are questions about it.   ?  ?Notes:  ?  ? ?  ? ? ?Patient Care Plan: General Pharmacy (Adult)  ?  ? ?Problem Identified: Hypertension, Hyperlipidemia, Hypothyroidism and Osteopenia   ?Priority: High  ?  ? ?Long-Range Goal: Patient-Specific Goal   ?Start Date: 07/14/2020  ?Expected End Date: 07/26/2021  ?This Visit's Progress: On track  ?Recent Progress: On track  ?Priority: High  ?Note:   ?Current Barriers:  ?No barriers noted ? ?Pharmacist Clinical Goal(s):  ?Patient will maintain control of blood pressure as evidenced by BP less than 140/90  through collaboration with PharmD and provider.  ? ?Interventions: ?1:1 collaboration with Steele Sizer, MD regarding development and update of comprehensive plan of care as evidenced by provider attestation and co-signature ?Inter-disciplinary care team collaboration (see longitudinal plan of care) ?Comprehensive medication review performed; medication list updated in electronic medical  record ? ?Hypertension (BP goal <140/90) ?-Controlled ?-Current treatment: ?Losartan 25 mg 1 tablet daily: Appropriate, Effective, Safe, Accessible ?-Medications previously tried: NA  ?-Current home readings: 136/60, 130s  ?-Current dietary habits: Weight Watchers ?-Current exercise habits: walking dog 2-3 times daily. Pool/gym weekly if able.   ?-Denies hypotensive/hypertensive symptoms ?-Recommended to continue current medication ? ?Hyperlipidemia: (LDL goal < 100) ?-Controlled ?-Current treatment: ?Rosuvastatin 20 mg daily: Appropriate, Effective, Safe, Accessible  ?-Current antiplatelet treatment: ?Aspirin 81 mg daily   ?-Medications previously tried: NA  ?-bruising more easily  ?-Recommended to continue current medication ? ?Hypothyroidism (Goal: Maintain Euthyroid state) ?-Controlled ?-Current treatment  ?Levothyroxine 88 mcg daily ?-Medications previously tried: NA  ?-Reports heat sensitivity.  ?-Recommended to continue current medication ? ?Osteopenia (Goal Maintain bone density and prevent fractures) ?-Controlled ?-Patient is not a candidate for pharmacologic treatment ?-Current treatment  ?Calciuim + Phosphorous + Vitamin D 2 tablets daily  ?-Medications previously tried: NA  ?-Recommend [phone] units of vitamin D daily. Recommend 1200 mg of calcium daily from dietary and supplemental sources. ?-Recommended to continue current medication ? ?Patient Goals/Self-Care Activities ?Patient will:  ?- check blood pressure weekly, document, and provide at future appointments ? ?Follow Up Plan: No further follow up required: Patient will reach out to clinical  team as needed. ?  ? ? ? ?Patient agreed to services and verbal consent obtained.  ? ?Patient verbalizes understanding of instructions and care plan provided today and agrees to view in [place]. Active MyChart status confirmed with patient.   ? ?Doristine Section, BCACP, CPP ?Clinical Pharmacist Practitioner  ?[facility] ?[phone] ?  ?

## 2021-07-29 DIAGNOSIS — I10 Essential (primary) hypertension: Secondary | ICD-10-CM

## 2021-07-29 DIAGNOSIS — E7800 Pure hypercholesterolemia, unspecified: Secondary | ICD-10-CM

## 2021-10-12 ENCOUNTER — Other Ambulatory Visit: Payer: Self-pay | Admitting: Family Medicine

## 2021-10-12 DIAGNOSIS — E7800 Pure hypercholesterolemia, unspecified: Secondary | ICD-10-CM

## 2021-10-17 ENCOUNTER — Other Ambulatory Visit: Payer: Self-pay | Admitting: Family Medicine

## 2021-10-17 DIAGNOSIS — Z1231 Encounter for screening mammogram for malignant neoplasm of breast: Secondary | ICD-10-CM

## 2021-11-25 NOTE — Progress Notes (Unsigned)
Name: Paige [name]   MRN: [id number]    DOB: Sep 04, 1945   Date:11/28/2021       Progress Note  Subjective  Chief Complaint  Follow Up  HPI  Hyperlipidemia: she is now on Rosuvastatin in place of Pravastatin , she denies myalgia ,last LDL 76   HTN: She had stopped taking losartan since she lost weight , she was on half pill for months but went to the dentist and bp spiked to 180's, she is currently taking medication again, but not prior to todays visit, bp is at goal. BP at home has been well controlled  No chest pain, palpitation or sob Denies dizziness.    Hypothyroidism: she has been on levothyroxine for 88 mcg for many years. TSH has been at goal for a long time, last TSH was suppressed, currently taking one daily and half on Sundays and past two levels were normal, we will recheck yearly. No diarrhea , palpitation or anxiety    OSA : diagnosed by Dr. Humphrey Rolls , she was on CPAP but is doing well since weight loss and has been off CPAP machine, however she has PLM and RLS , her ferritin level was normal and it does not bother her   Overweight: she joined weight Watchers in October 2021 her original weight 184 lbs and weight has been stable since January 2023 around 150-155 lbs   Patient Active Problem List   Diagnosis Date Noted   OSA (obstructive sleep apnea) 02/05/2020   Essential hypertension 08/28/2018   Osteopenia 08/28/2018   Hyperlipidemia 10/27/2014   Allergic rhinitis 10/27/2014   Adult hypothyroidism 10/13/2014    No past surgical history on file.  Family History  Problem Relation Age of Onset   Cancer Mother    Diabetes Father    Stroke Father    Breast cancer Neg Hx     Social History   Tobacco Use   Smoking status: Former    Packs/day: 0.25    Years: 5.00    Total pack years: 1.25    Types: Cigarettes    Start date: 05/01/1972    Quit date: 05/01/1977    Years since quitting: 44.6   Smokeless tobacco: Never  Substance Use Topics   Alcohol use: No      Current Outpatient Medications:    aspirin 81 MG tablet, Take 1 tablet by mouth daily., Disp: , Rfl:    Calcium-Phosphorus-Vitamin D 100-50-100 MG-MG-UNIT CHEW, Chew 1 tablet by mouth., Disp: , Rfl:    Coenzyme Q10 100 MG TABS, Take 100 mg by mouth daily., Disp: , Rfl:    cyanocobalamin 100 MCG tablet, Take 1,000 mcg by mouth daily. , Disp: , Rfl:    diphenhydrAMINE (BENADRYL) 25 MG tablet, Take 12.5 mg by mouth at bedtime as needed., Disp: , Rfl:    glucosamine-chondroitin 500-400 MG tablet, Take 1 tablet by mouth 3 (three) times daily., Disp: , Rfl:    levothyroxine (SYNTHROID) 88 MCG tablet, TAKE 1 TABLET EVERY DAY BEFORE BREAKFAST, Disp: 90 tablet, Rfl: 1   losartan (COZAAR) 25 MG tablet, TAKE 1 TABLET EVERY DAY, Disp: 90 tablet, Rfl: 0   MULTIPLE VITAMIN PO, Take by mouth daily., Disp: , Rfl:    Multiple Vitamins-Minerals (HAIR SKIN AND NAILS FORMULA PO), Take 1 tablet by mouth daily., Disp: , Rfl:    Omega-3 Fatty Acids (FISH OIL BURP-LESS) 1200 MG CAPS, Take 1 capsule by mouth., Disp: , Rfl:    rosuvastatin (CRESTOR) 20 MG tablet, TAKE 1 TABLET  EVERY DAY, Disp: 90 tablet, Rfl: 0  Allergies  Allergen Reactions   Codeine Nausea And Vomiting    I personally reviewed active problem list, medication list, allergies, family history, social history, health maintenance with the patient/caregiver today.   ROS  Constitutional: Negative for fever or weight change.  Respiratory: Negative for cough and shortness of breath.   Cardiovascular: Negative for chest pain or palpitations.  Gastrointestinal: Negative for abdominal pain, no bowel changes.  Musculoskeletal: Negative for gait problem or joint swelling.  Skin: Negative for rash.  Neurological: Negative for dizziness or headache.  No other specific complaints in a complete review of systems (except as listed in HPI above).   Objective  Vitals:   11/28/21 0836  BP: 130/82  Pulse: 67  Resp: 16  Temp: 98.6 F (37 C)   TempSrc: Oral  SpO2: 98%  Weight: 154 lb 14.4 oz (70.3 kg)  Height: '5\' 5"'$  (1.651 m)    Body mass index is 25.78 kg/m.  Physical Exam  Constitutional: Patient appears well-developed and well-nourished.  No distress.  HEENT: head atraumatic, normocephalic, pupils equal and reactive to light, neck supple Cardiovascular: Normal rate, regular rhythm and normal heart sounds.  No murmur heard. No BLE edema. Pulmonary/Chest: Effort normal and breath sounds normal. No respiratory distress. Abdominal: Soft.  There is no tenderness. Psychiatric: Patient has a normal mood and affect. behavior is normal. Judgment and thought content normal.    PHQ2/9:    11/28/2021    8:31 AM 05/31/2021    9:19 AM 04/26/2021    2:30 PM 08/06/2020    7:55 AM 05/26/2020   10:18 AM  Depression screen PHQ 2/9  Decreased Interest 0 0 0 0 1  Down, Depressed, Hopeless 0 0 0 0 1  PHQ - 2 Score 0 0 0 0 2  Altered sleeping 0 0   0  Tired, decreased energy 0 0   0  Change in appetite 0 0   0  Feeling bad or failure about yourself  0 0   0  Trouble concentrating 0 0   0  Moving slowly or fidgety/restless 0 0   0  Suicidal thoughts 0 0   0  PHQ-9 Score 0 0   2  Difficult doing work/chores Not difficult at all    Not difficult at all    phq 9 is negative   Fall Risk:    11/28/2021    8:31 AM 05/31/2021    9:19 AM 04/26/2021    2:34 PM 08/06/2020    7:55 AM 05/26/2020   10:18 AM  Fall Risk   Falls in the past year? 0 0 0 0 0  Number falls in past yr: 0 0 0 0 0  Injury with Fall? 0 0 0 0 0  Risk for fall due to : No Fall Risks No Fall Risks No Fall Risks    Follow up Falls prevention discussed;Education provided Falls prevention discussed Falls prevention discussed        Functional Status Survey: Is the patient deaf or have difficulty hearing?: Yes Does the patient have difficulty seeing, even when wearing glasses/contacts?: No Does the patient have difficulty concentrating, remembering, or making  decisions?: No Does the patient have difficulty walking or climbing stairs?: No Does the patient have difficulty dressing or bathing?: No Does the patient have difficulty doing errands alone such as visiting a doctor's office or shopping?: No    Assessment & Plan  1. Essential hypertension  Bp  is at goal   2. RLS (restless legs syndrome)   3. Adult hypothyroidism  - levothyroxine (SYNTHROID) 88 MCG tablet; Take 1 tablet (88 mcg total) by mouth daily before breakfast. Only half on Sunday  Dispense: 90 tablet; Refill: 1  4. Pure hypercholesterolemia  Continue statin therapy   5. 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 Prior to procedures  Dispense: 4 tablet; Refill: 0

## 2021-11-28 ENCOUNTER — Encounter: Payer: Self-pay | Admitting: Family Medicine

## 2021-11-28 ENCOUNTER — Ambulatory Visit (INDEPENDENT_AMBULATORY_CARE_PROVIDER_SITE_OTHER): Payer: Medicare HMO | Admitting: Family Medicine

## 2021-11-28 VITALS — BP 130/82 | HR 67 | Temp 98.6°F | Resp 16 | Ht 65.0 in | Wt 154.9 lb

## 2021-11-28 DIAGNOSIS — E7800 Pure hypercholesterolemia, unspecified: Secondary | ICD-10-CM | POA: Diagnosis not present

## 2021-11-28 DIAGNOSIS — G2581 Restless legs syndrome: Secondary | ICD-10-CM

## 2021-11-28 DIAGNOSIS — E039 Hypothyroidism, unspecified: Secondary | ICD-10-CM | POA: Diagnosis not present

## 2021-11-28 DIAGNOSIS — F419 Anxiety disorder, unspecified: Secondary | ICD-10-CM

## 2021-11-28 DIAGNOSIS — G4733 Obstructive sleep apnea (adult) (pediatric): Secondary | ICD-10-CM | POA: Diagnosis not present

## 2021-11-28 DIAGNOSIS — I10 Essential (primary) hypertension: Secondary | ICD-10-CM | POA: Diagnosis not present

## 2021-11-28 MED ORDER — DIAZEPAM 5 MG PO TABS: 5.0000 mg | ORAL_TABLET | Freq: Four times a day (QID) | ORAL | 0 refills | Status: DC | PRN

## 2021-11-28 MED ORDER — LEVOTHYROXINE SODIUM 88 MCG PO TABS: 88.0000 ug | ORAL_TABLET | Freq: Every day | ORAL | 1 refills | Status: DC

## 2021-12-01 ENCOUNTER — Ambulatory Visit
Admission: RE | Admit: 2021-12-01 | Discharge: 2021-12-01 | Disposition: A | Discharge status: Home / Self Care | Payer: Medicare HMO | Source: Ambulatory Visit | Attending: Family Medicine | Admitting: Family Medicine

## 2021-12-01 DIAGNOSIS — Z1231 Encounter for screening mammogram for malignant neoplasm of breast: Secondary | ICD-10-CM | POA: Insufficient documentation

## 2021-12-05 ENCOUNTER — Ambulatory Visit: Payer: Medicare HMO | Admitting: Internal Medicine

## 2021-12-19 ENCOUNTER — Ambulatory Visit: Payer: Medicare HMO | Admitting: Internal Medicine

## 2021-12-25 ENCOUNTER — Other Ambulatory Visit: Payer: Self-pay | Admitting: Family Medicine

## 2021-12-25 DIAGNOSIS — E039 Hypothyroidism, unspecified: Secondary | ICD-10-CM

## 2022-01-16 NOTE — Procedures (Signed)
error 

## 2022-03-09 ENCOUNTER — Other Ambulatory Visit: Payer: Self-pay | Admitting: Family Medicine

## 2022-03-09 DIAGNOSIS — E7800 Pure hypercholesterolemia, unspecified: Secondary | ICD-10-CM

## 2022-04-10 DIAGNOSIS — H43813 Vitreous degeneration, bilateral: Secondary | ICD-10-CM | POA: Diagnosis not present

## 2022-04-10 DIAGNOSIS — Z0100 Encounter for examination of eyes and vision without abnormal findings: Secondary | ICD-10-CM | POA: Diagnosis not present

## 2022-04-27 ENCOUNTER — Ambulatory Visit: Payer: Medicare HMO

## 2022-05-02 ENCOUNTER — Encounter: Payer: Self-pay | Admitting: Physician Assistant

## 2022-05-02 ENCOUNTER — Ambulatory Visit (INDEPENDENT_AMBULATORY_CARE_PROVIDER_SITE_OTHER): Payer: Medicare HMO | Admitting: Physician Assistant

## 2022-05-02 DIAGNOSIS — Z0000 Encounter for general adult medical examination without abnormal findings: Secondary | ICD-10-CM | POA: Diagnosis not present

## 2022-05-02 NOTE — Patient Instructions (Signed)
[name] , Thank you for taking time to come for your Medicare Wellness Visit. I appreciate your ongoing commitment to your health goals. Please review the following plan we discussed and let me know if I can assist you in the future.   Screening recommendations/referrals: Colonoscopy: Completed in 2022- repeat every 3 years  Mammogram: Up to date- repeat every year  Bone Density: Completed in 2022- repeat every 2 years. Last results show osteopenia  Recommended yearly ophthalmology/optometry visit for glaucoma screening and checkup Recommended yearly dental visit for hygiene and checkup  Vaccinations: Influenza vaccine: Over due  Pneumococcal vaccine: Completed vaccine course  Tdap vaccine: Over due- please speak to your PCP about getting a booster  Shingles vaccine: over due- please speak to your PCP about this to protect yourself from shingles    Covid-19:Please stay up to date on the recommended vaccines and boosters for your age group  Advanced directives: Completed but not on file with office. Please provide a copy so we can update your medical record   Conditions/risks identified: None  Next appointment: Follow up in one year for your annual wellness visit    Preventive Care 65 Years and Older, Female Preventive care refers to lifestyle choices and visits with your health care provider that can promote health and wellness. What does preventive care include? A yearly physical exam. This is also called an annual well check. Dental exams once or twice a year. Routine eye exams. Ask your health care provider how often you should have your eyes checked. Personal lifestyle choices, including: Daily care of your teeth and gums. Regular physical activity. Eating a healthy diet. Avoiding tobacco and drug use. Limiting alcohol use. Practicing safe sex. Taking low-dose aspirin every day. Taking vitamin and mineral supplements as recommended by your health care  provider. What happens during an annual well check? The services and screenings done by your health care provider during your annual well check will depend on your age, overall health, lifestyle risk factors, and family history of disease. Counseling  Your health care provider may ask you questions about your: Alcohol use. Tobacco use. Drug use. Emotional well-being. Home and relationship well-being. Sexual activity. Eating habits. History of falls. Memory and ability to understand (cognition). Work and work Statistician. Reproductive health. Screening  You may have the following tests or measurements: Height, weight, and BMI. Blood pressure. Lipid and cholesterol levels. These may be checked every 5 years, or more frequently if you are over 71 years old. Skin check. Lung cancer screening. You may have this screening every year starting at age 64 if you have a 30-pack-year history of smoking and currently smoke or have quit within the past 15 years. Fecal occult blood test (FOBT) of the stool. You may have this test every year starting at age 88. Flexible sigmoidoscopy or colonoscopy. You may have a sigmoidoscopy every 5 years or a colonoscopy every 10 years starting at age 60. Hepatitis C blood test. Hepatitis B blood test. Sexually transmitted disease (STD) testing. Diabetes screening. This is done by checking your blood sugar (glucose) after you have not eaten for a while (fasting). You may have this done every 1-3 years. Bone density scan. This is done to screen for osteoporosis. You may have this done starting at age 35. Mammogram. This may be done every 1-2 years. Talk to your health care provider about how often you should have regular mammograms. Talk with your health care provider about your test results, treatment options, and  if necessary, the need for more tests. Vaccines  Your health care provider may recommend certain vaccines, such as: Influenza vaccine. This is  recommended every year. Tetanus, diphtheria, and acellular pertussis (Tdap, Td) vaccine. You may need a Td booster every 10 years. Zoster vaccine. You may need this after age 10. Pneumococcal 13-valent conjugate (PCV13) vaccine. One dose is recommended after age 35. Pneumococcal polysaccharide (PPSV23) vaccine. One dose is recommended after age 79. Talk to your health care provider about which screenings and vaccines you need and how often you need them. This information is not intended to replace advice given to you by your health care provider. Make sure you discuss any questions you have with your health care provider. Document Released: 05/14/2015 Document Revised: 01/05/2016 Document Reviewed: 02/16/2015 Elsevier Interactive Patient Education  2017 [employer] Prevention in the Home Falls can cause injuries. They can happen to people of all ages. There are many things you can do to make your home safe and to help prevent falls. What can I do on the outside of my home? Regularly fix the edges of walkways and driveways and fix any cracks. Remove anything that might make you trip as you walk through a door, such as a raised step or threshold. Trim any bushes or trees on the path to your home. Use bright outdoor lighting. Clear any walking paths of anything that might make someone trip, such as rocks or tools. Regularly check to see if handrails are loose or broken. Make sure that both sides of any steps have handrails. Any raised decks and porches should have guardrails on the edges. Have any leaves, snow, or ice cleared regularly. Use sand or salt on walking paths during winter. Clean up any spills in your garage right away. This includes oil or grease spills. What can I do in the bathroom? Use night lights. Install grab bars by the toilet and in the tub and shower. Do not use towel bars as grab bars. Use non-skid mats or decals in the tub or shower. If you need to sit down in  the shower, use a plastic, non-slip stool. Keep the floor dry. Clean up any water that spills on the floor as soon as it happens. Remove soap buildup in the tub or shower regularly. Attach bath mats securely with double-sided non-slip rug tape. Do not have throw rugs and other things on the floor that can make you trip. What can I do in the bedroom? Use night lights. Make sure that you have a light by your bed that is easy to reach. Do not use any sheets or blankets that are too big for your bed. They should not hang down onto the floor. Have a firm chair that has side arms. You can use this for support while you get dressed. Do not have throw rugs and other things on the floor that can make you trip. What can I do in the kitchen? Clean up any spills right away. Avoid walking on wet floors. Keep items that you use a lot in easy-to-reach places. If you need to reach something above you, use a strong step stool that has a grab bar. Keep electrical cords out of the way. Do not use floor polish or wax that makes floors slippery. If you must use wax, use non-skid floor wax. Do not have throw rugs and other things on the floor that can make you trip. What can I do with my stairs? Do not leave any  items on the stairs. Make sure that there are handrails on both sides of the stairs and use them. Fix handrails that are broken or loose. Make sure that handrails are as long as the stairways. Check any carpeting to make sure that it is firmly attached to the stairs. Fix any carpet that is loose or worn. Avoid having throw rugs at the top or bottom of the stairs. If you do have throw rugs, attach them to the floor with carpet tape. Make sure that you have a light switch at the top of the stairs and the bottom of the stairs. If you do not have them, ask someone to add them for you. What else can I do to help prevent falls? Wear shoes that: Do not have high heels. Have rubber bottoms. Are comfortable  and fit you well. Are closed at the toe. Do not wear sandals. If you use a stepladder: Make sure that it is fully opened. Do not climb a closed stepladder. Make sure that both sides of the stepladder are locked into place. Ask someone to hold it for you, if possible. Clearly mark and make sure that you can see: Any grab bars or handrails. First and last steps. Where the edge of each step is. Use tools that help you move around (mobility aids) if they are needed. These include: Canes. Walkers. Scooters. Crutches. Turn on the lights when you go into a dark area. Replace any light bulbs as soon as they burn out. Set up your furniture so you have a clear path. Avoid moving your furniture around. If any of your floors are uneven, fix them. If there are any pets around you, be aware of where they are. Review your medicines with your doctor. Some medicines can make you feel dizzy. This can increase your chance of falling. Ask your doctor what other things that you can do to help prevent falls. This information is not intended to replace advice given to you by your health care provider. Make sure you discuss any questions you have with your health care provider. Document Released: 02/11/2009 Document Revised: 09/23/2015 Document Reviewed: 05/22/2014 Elsevier Interactive Patient Education  2017 [employer].

## 2022-05-02 NOTE — Progress Notes (Signed)
Virtual Visit via Telephone Note  I connected with  Tariyah Pendry Nylin on 05/02/22 at  9:20 AM EST by telephone and verified that I am speaking with the correct person using two identifiers.  Location: Patient: At home  Provider: [place], [place]   I discussed the limitations, risks, security and privacy concerns of performing an evaluation and management service by telephone and the availability of in person appointments. The patient expressed understanding and agreed to proceed.  Interactive audio and video telecommunications were attempted between this nurse and patient, however failed, due to patient having technical difficulties OR patient did not have access to video capability.  We continued and completed visit with audio only.  Some vital signs may be absent or patient reported.   Today's Provider: Talitha Givens, MHS, PA-C Introduced myself to the patient as a PA-C and provided education on APPs in clinical practice.    Subjective:   Leidy Massar Nylin is a 77 y.o. female who presents for Medicare Annual (Subsequent) preventive examination.  Review of Systems:         Objective:     Vitals: There were no vitals taken for this visit.  There is no height or weight on file to calculate BMI.     04/26/2021    2:32 PM 03/04/2020    1:47 PM 03/04/2019    2:01 PM 01/22/2017   11:14 AM 01/15/2017   11:46 AM 11/20/2016    2:39 PM 09/06/2016   10:58 AM  Advanced Directives  Does Patient Have a Medical Advance Directive? Yes Yes No No No No No  Type of Paramedic of [place];Living will [place];Living will       Copy of [place] in Chart? No - copy requested No - copy requested       Would patient like information on creating a medical advance directive?   Yes (MAU/Ambulatory/Procedural Areas - Information given)   Yes (ED - Information included in AVS)     Tobacco Social  History   Tobacco Use  Smoking Status Former   Packs/day: 0.25   Years: 5.00   Total pack years: 1.25   Types: Cigarettes   Start date: 05/01/1972   Quit date: 05/01/1977   Years since quitting: 45.0  Smokeless Tobacco Never     Counseling given: Not Answered   Clinical Intake:  Pre-visit preparation completed: Yes  Pain : No/denies pain     Nutritional Status: BMI 25 -29 Overweight Nutritional Risks: None Diabetes: No  How often do you need to have someone help you when you read instructions, pamphlets, or other written materials from your doctor or pharmacy?: 1 - Never What is the last grade level you completed in school?: High School  Interpreter Needed?: No     Past Medical History:  Diagnosis Date   Hyperlipidemia    Hypertension    Thyroid disease    History reviewed. No pertinent surgical history. Family History  Problem Relation Age of Onset   Cancer Mother    Diabetes Father    Stroke Father    Breast cancer Neg Hx    Social History   Socioeconomic History   Marital status: Married    Spouse name: Not on file   Number of children: 0   Years of education: Not on file   Highest education level: High school graduate  Occupational History   Occupation: Retired   Occupation: Armed forces operational officer  Tobacco Use   Smoking status: Former    Packs/day: 0.25    Years: 5.00    Total pack years: 1.25    Types: Cigarettes    Start date: 05/01/1972    Quit date: 05/01/1977    Years since quitting: 45.0   Smokeless tobacco: Never  Vaping Use   Vaping Use: Never used  Substance and Sexual Activity   Alcohol use: No   Drug use: No   Sexual activity: Yes    Partners: Male  Other Topics Concern   Not on file  Social History Narrative   Second marriage, widow from first marriage at age 44 and her current husband had two teenagers who she help raise    Social Determinants of Health   Financial Resource Strain: Low Risk  (04/26/2021)   Overall Financial  Resource Strain (CARDIA)    Difficulty of Paying Living Expenses: Not hard at all  Food Insecurity: No Food Insecurity (04/26/2021)   Hunger Vital Sign    Worried About Running Out of Food in the Last Year: Never true    Ran Out of Food in the Last Year: Never true  Transportation Needs: No Transportation Needs (04/26/2021)   PRAPARE - Hydrologist (Medical): No    Lack of Transportation (Non-Medical): No  Physical Activity: Sufficiently Active (04/26/2021)   Exercise Vital Sign    Days of Exercise per Week: 7 days    Minutes of Exercise per Session: 30 min  Stress: No Stress Concern Present (04/26/2021)   [place]    Feeling of Stress : Not at all  Social Connections: [place] (04/26/2021)   Social Connection and Isolation Panel [NHANES]    Frequency of Communication with Friends and Family: More than three times a week    Frequency of Social Gatherings with Friends and Family: More than three times a week    Attends Religious Services: More than 4 times per year    Active Member of [employer] or Organizations: Yes    Attends Archivist Meetings: More than 4 times per year    Marital Status: Married    Outpatient Encounter Medications as of 05/02/2022  Medication Sig   aspirin 81 MG tablet Take 1 tablet by mouth daily.   Calcium-Phosphorus-Vitamin D 100-50-100 MG-MG-UNIT CHEW Chew 1 tablet by mouth.   Coenzyme Q10 100 MG TABS Take 100 mg by mouth daily.   cyanocobalamin 100 MCG tablet Take 1,000 mcg by mouth daily.    diazepam (VALIUM) 5 MG tablet Take 1 tablet (5 mg total) by mouth every 6 (six) hours as needed for anxiety. Prior to procedures   glucosamine-chondroitin 500-400 MG tablet Take 1 tablet by mouth 3 (three) times daily.   levothyroxine (SYNTHROID) 88 MCG tablet TAKE 1 TABLET EVERY DAY BEFORE BREAKFAST   losartan (COZAAR) 25 MG tablet TAKE 1 TABLET EVERY DAY    MULTIPLE VITAMIN PO Take by mouth daily.   Multiple Vitamins-Minerals (HAIR SKIN AND NAILS FORMULA PO) Take 1 tablet by mouth daily.   Omega-3 Fatty Acids (FISH OIL BURP-LESS) 1200 MG CAPS Take 1 capsule by mouth.   rosuvastatin (CRESTOR) 20 MG tablet TAKE 1 TABLET EVERY DAY   No facility-administered encounter medications on file as of 05/02/2022.    Activities of Daily Living    05/02/2022    9:44 AM 05/02/2022    9:20 AM  In your present state of health, do you have  any difficulty performing the following activities:  Hearing? 0 0  Vision? 0 1  Difficulty concentrating or making decisions? 0 0  Walking or climbing stairs? 0 0  Dressing or bathing? 0 0  Doing errands, shopping? 0 0  Preparing Food and eating ? N   Using the Toilet? N   In the past six months, have you accidently leaked urine? N   Do you have problems with loss of bowel control? N   Managing your Medications? N   Managing your Finances? N   Housekeeping or managing your Housekeeping? N     Patient Care Team: Steele Sizer, MD as PCP - General (Family Medicine) Birder Robson, MD as Referring Physician (Ophthalmology) Carloyn Manner, MD as Referring Physician (Otolaryngology) Germaine Pomfret, [facility] as Pharmacist (Pharmacist)    Assessment:   This is a routine wellness examination for [place].  Exercise Activities and Dietary recommendations Current Exercise Habits: The patient does not participate in regular exercise at present, Intensity: Mild   Goals Addressed   None     Fall Risk:    05/02/2022    9:20 AM 11/28/2021    8:31 AM 05/31/2021    9:19 AM 04/26/2021    2:34 PM 08/06/2020    7:55 AM  Fall Risk   Falls in the past year? 1 0 0 0 0  Number falls in past yr: 0 0 0 0 0  Injury with Fall? 0 0 0 0 0  Risk for fall due to : Impaired balance/gait No Fall Risks No Fall Risks No Fall Risks   Follow up Falls prevention discussed;Education provided;Falls evaluation completed Falls prevention  discussed;Education provided Falls prevention discussed Falls prevention discussed     FALL RISK PREVENTION PERTAINING TO THE HOME:  Any stairs in or around the home? Yes  If so, are there any without handrails? Yes   Home free of loose throw rugs in walkways, pet beds, electrical cords, etc? Yes  Adequate lighting in your home to reduce risk of falls? Yes   ASSISTIVE DEVICES UTILIZED TO PREVENT FALLS:  Life alert? No  Use of a cane, walker or w/c? No  Grab bars in the bathroom? Yes  Shower chair or bench in shower? No  Elevated toilet seat or a handicapped toilet? No   DME ORDERS:  DME order needed?  No   TIMED UP AND GO:  Was the test performed?  No Telephone visit .  Length of time to ambulate 10 feet: 0 sec.      Depression Screen    05/02/2022    9:20 AM 11/28/2021    8:31 AM 05/31/2021    9:19 AM 04/26/2021    2:30 PM  PHQ 2/9 Scores  PHQ - 2 Score 0 0 0 0  PHQ- 9 Score 0 0 0      Cognitive Function        05/02/2022    9:46 AM 03/04/2020    1:50 PM 02/26/2018   11:35 AM 11/20/2016    2:43 PM  6CIT Screen  What Year? 0 points 0 points 0 points 0 points  What month? 0 points 0 points 0 points 0 points  What time? 0 points 0 points 0 points 0 points  Count back from 20 0 points 0 points 0 points 0 points  Months in reverse 0 points 0 points 0 points 0 points  Repeat phrase 0 points 0 points 0 points 0 points  Total Score 0 points 0 points 0  points 0 points    Immunization History  Administered Date(s) Administered   Fluad Quad(high Dose 65+) 02/11/2019, 01/23/2020   Influenza, High Dose Seasonal PF 03/16/2015, 02/26/2018   Influenza-Unspecified 03/16/2017, 02/17/2021   PFIZER(Purple Top)SARS-COV-2 Vaccination 06/21/2019, 07/15/2019, 03/31/2020   Pneumococcal Conjugate-13 11/20/2016   Pneumococcal Polysaccharide-23 05/01/2012, 02/26/2018    Qualifies for Shingles Vaccine? Yes . Due for Shingrix. Education has been provided regarding the importance of  this vaccine. Pt has been advised to call insurance company to determine out of pocket expense. Advised may also receive vaccine at local pharmacy or Health Dept. Verbalized acceptance and understanding.  Tdap: Although this vaccine is not a covered service during a Wellness Exam, does the patient still wish to receive this vaccine today?  Yes .  Education has been provided regarding the importance of this vaccine. Advised may receive this vaccine at local pharmacy or Health Dept. Aware to provide a copy of the vaccination record if obtained from local pharmacy or Health Dept. Verbalized acceptance and understanding.  Flu Vaccine: Received it at [facility] will request records  Pneumococcal Vaccine: Completed 02/26/2018   Covid-19 Vaccine: Completed vaccines  Screening Tests Health Maintenance  Topic Date Due   DTaP/Tdap/Td (1 - Tdap) Never done   Zoster Vaccines- Shingrix (1 of 2) Never done   INFLUENZA VACCINE  11/29/2021   COVID-19 Vaccine (4 - 2023-24 season) 12/30/2021   Medicare Annual Wellness (AWV)  04/26/2022   DEXA SCAN  09/15/2022   MAMMOGRAM  12/02/2022   Fecal DNA (Cologuard)  09/30/2023   Pneumonia Vaccine 43+ Years old  Completed   Hepatitis C Screening  Completed   HPV VACCINES  Aged Out    Cancer Screenings:  Colorectal Screening: Completed Cologuard 09/29/2020. Repeat every 3 years  Mammogram: Completed 12/01/2021. Repeat every year  Bone Density: Completed 09/14/2020. Results reflect OSTEOPENIA, Repeat every 2 years.   Lung Cancer Screening: (Low Dose CT Chest recommended if Age 62-80 years, 30 pack-year currently smoking OR have quit w/in 15years.) does not qualify.   Lung Cancer Screening Referral: An Epic message has been sent to Burgess Estelle, RN (Oncology Nurse Navigator) regarding the possible need for this exam. Raquel Sarna will review the patient's chart to determine if the patient truly qualifies for the exam. If the patient qualifies, Raquel Sarna will order the Low  Dose CT of the chest to facilitate the scheduling of this exam.  Additional Screening:  Hepatitis C Screening: does qualify; Completed 09/06/2017  Vision Screening: Recommended annual ophthalmology exams for early detection of glaucoma and other disorders of the eye. Is the patient up to date with their annual eye exam?  Yes  Who is the provider or what is the name of the office in which the pt attends annual eye exams? [facility]   Dental Screening: Recommended annual dental exams for proper oral hygiene  Community Resource Referral:  CRR required this visit?  No       Plan:  I have personally reviewed and addressed the Medicare Annual Wellness questionnaire and have noted the following in the patient's chart:  A. Medical and social history B. Use of alcohol, tobacco or illicit drugs  C. Current medications and supplements D. Functional ability and status E.  Nutritional status F.  Physical activity G. Advance directives H. List of other physicians I.  Hospitalizations, surgeries, and ER visits in previous 12 months J.  [place] such as hearing and vision if needed, cognitive and depression L. Referrals and appointments  In addition, I have reviewed and discussed with patient certain preventive protocols, quality metrics, and best practice recommendations. A written personalized care plan for preventive services as well as general preventive health recommendations were provided to patient.  Signed,   Talitha Givens, MHS, PA-C [facility]

## 2022-05-21 ENCOUNTER — Other Ambulatory Visit: Payer: Self-pay | Admitting: Family Medicine

## 2022-05-21 DIAGNOSIS — E7800 Pure hypercholesterolemia, unspecified: Secondary | ICD-10-CM

## 2022-05-30 NOTE — Progress Notes (Signed)
Name: Paige [name]   MRN: [id number]    DOB: 18-Aug-1945   Date:05/31/2022       Progress Note  Subjective  Chief Complaint  Follow Up  HPI  Hyperlipidemia: she is now on Rosuvastatin in place of Pravastatin , she denies myalgia ,last LDL 76 . We will recheck labs   HTN: She had stopped taking losartan since she lost weight , she was on half pill for months but went to the dentist and bp spiked to 180's, she is back on Losartan 25 mg daily , no chest pain or palpitation  or decrease in exercise tolerance.    Hypothyroidism: she has been on levothyroxine for 88 mcg  daily and half on Sundays, we will recheck labs. She denies hair loss, dry skin or change in bowel movements, occasionally feels constipated that resolves with increase in fiber intake   OSA : diagnosed by Dr. Humphrey Rolls , she was on CPAP but is doing well since weight loss and has been off CPAP machine, however she has PLM and RLS , her ferritin level was normal and it does not bother her . Unchanged   Osteopenia: she is taking calcium and vitamin D   FRAX* RESULTS:  (version: 3.5) 10-year Probability of Fracture1 Major Osteoporotic Fracture2 Hip Fracture 10.7% 1.9%  Overweight: she joined weight Wat[employer] in October 2021 her original weight 184 lbs and weight has been stable since January 2023 around 150-155 lbs , today is up to 159 lbs. She wants to stay below 160 lbs   Patient Active Problem List   Diagnosis Date Noted   RLS (restless legs syndrome) 11/28/2021   Pure hypercholesterolemia 11/28/2021   OSA (obstructive sleep apnea) 02/05/2020   Essential hypertension 08/28/2018   Osteopenia 08/28/2018   Hyperlipidemia 10/27/2014   Allergic rhinitis 10/27/2014   Adult hypothyroidism 10/13/2014    No past surgical history on file.  Family History  Problem Relation Age of Onset   Cancer Mother    Diabetes Father    Stroke Father    Breast cancer Neg Hx     Social History   Tobacco Use   Smoking status:  Former    Packs/day: 0.25    Years: 5.00    Total pack years: 1.25    Types: Cigarettes    Start date: 05/01/1972    Quit date: 05/01/1977    Years since quitting: 45.1   Smokeless tobacco: Never  Substance Use Topics   Alcohol use: No     Current Outpatient Medications:    aspirin 81 MG tablet, Take 1 tablet by mouth daily., Disp: , Rfl:    Calcium-Phosphorus-Vitamin D 100-50-100 MG-MG-UNIT CHEW, Chew 1 tablet by mouth., Disp: , Rfl:    Coenzyme Q10 100 MG TABS, Take 100 mg by mouth daily., Disp: , Rfl:    cyanocobalamin 100 MCG tablet, Take 1,000 mcg by mouth daily. , Disp: , Rfl:    diazepam (VALIUM) 5 MG tablet, Take 1 tablet (5 mg total) by mouth every 6 (six) hours as needed for anxiety. Prior to procedures, Disp: 4 tablet, Rfl: 0   glucosamine-chondroitin 500-400 MG tablet, Take 1 tablet by mouth 3 (three) times daily., Disp: , Rfl:    levothyroxine (SYNTHROID) 88 MCG tablet, TAKE 1 TABLET EVERY DAY BEFORE BREAKFAST, Disp: 90 tablet, Rfl: 1   losartan (COZAAR) 25 MG tablet, TAKE 1 TABLET EVERY DAY, Disp: 90 tablet, Rfl: 0   MULTIPLE VITAMIN PO, Take by mouth daily., Disp: , Rfl:  Multiple Vitamins-Minerals (HAIR SKIN AND NAILS FORMULA PO), Take 1 tablet by mouth daily., Disp: , Rfl:    Omega-3 Fatty Acids (FISH OIL BURP-LESS) 1200 MG CAPS, Take 1 capsule by mouth., Disp: , Rfl:    rosuvastatin (CRESTOR) 20 MG tablet, TAKE 1 TABLET EVERY DAY, Disp: 90 tablet, Rfl: 0  Allergies  Allergen Reactions   Codeine Nausea And Vomiting    I personally reviewed active problem list, medication list, allergies, family history, social history, health maintenance with the patient/caregiver today.   ROS  Constitutional: Negative for fever or weight change.  Respiratory: Negative for cough and shortness of breath.   Cardiovascular: Negative for chest pain or palpitations.  Gastrointestinal: Negative for abdominal pain, no bowel changes.  Musculoskeletal: Negative for gait problem or  joint swelling.  Skin: Negative for rash.  Neurological: Negative for dizziness or headache.  No other specific complaints in a complete review of systems (except as listed in HPI above).   Objective  Vitals:   05/31/22 0803  BP: 128/78  Pulse: 71  Resp: 16  SpO2: 98%  Weight: 159 lb (72.1 kg)  Height: '5\' 5"'$  (1.651 m)    Body mass index is 26.46 kg/m.  Physical Exam  Constitutional: Patient appears well-developed and well-nourished.  No distress.  HEENT: head atraumatic, normocephalic, pupils equal and reactive to light, neck supple Cardiovascular: Normal rate, regular rhythm and normal heart sounds.  No murmur heard. No BLE edema. Pulmonary/Chest: Effort normal and breath sounds normal. No respiratory distress. Abdominal: Soft.  There is no tenderness. Psychiatric: Patient has a normal mood and affect. behavior is normal. Judgment and thought content normal.   PHQ2/9:    05/31/2022    8:02 AM 05/02/2022    9:20 AM 11/28/2021    8:31 AM 05/31/2021    9:19 AM 04/26/2021    2:30 PM  Depression screen PHQ 2/9  Decreased Interest 0 0 0 0 0  Down, Depressed, Hopeless 0 0 0 0 0  PHQ - 2 Score 0 0 0 0 0  Altered sleeping 0 0 0 0   Tired, decreased energy 0 0 0 0   Change in appetite 0 0 0 0   Feeling bad or failure about yourself  0 0 0 0   Trouble concentrating 0 0 0 0   Moving slowly or fidgety/restless 0 0 0 0   Suicidal thoughts 0 0 0 0   PHQ-9 Score 0 0 0 0   Difficult doing work/chores  Not difficult at all Not difficult at all      phq 9 is negative   Fall Risk:    05/31/2022    8:02 AM 05/02/2022    9:20 AM 11/28/2021    8:31 AM 05/31/2021    9:19 AM 04/26/2021    2:34 PM  Fall Risk   Falls in the past year? 0 1 0 0 0  Number falls in past yr: 0 0 0 0 0  Injury with Fall? 0 0 0 0 0  Risk for fall due to : No Fall Risks Impaired balance/gait No Fall Risks No Fall Risks No Fall Risks  Follow up Falls prevention discussed Falls prevention discussed;Education  provided;Falls evaluation completed Falls prevention discussed;Education provided Falls prevention discussed Falls prevention discussed      Functional Status Survey: Is the patient deaf or have difficulty hearing?: Yes Does the patient have difficulty seeing, even when wearing glasses/contacts?: No Does the patient have difficulty concentrating, remembering, or making decisions?: No  Does the patient have difficulty walking or climbing stairs?: No Does the patient have difficulty dressing or bathing?: No Does the patient have difficulty doing errands alone such as visiting a doctor's office or shopping?: No    Assessment & Plan  1. Adult hypothyroidism  - TSH  2. Essential hypertension  - CBC with Differential/Platelet - COMPLETE METABOLIC PANEL WITH GFR  3. RLS (restless legs syndrome)  Doing well   4. OSA (obstructive sleep apnea)  Off machine due to weight loss  5. Pure hypercholesterolemia  - Lipid panel  6. Seasonal allergic rhinitis due to other allergic trigger   7. Osteopenia of neck of left femur  - DG Bone Density; Future - VITAMIN D 25 Hydroxy (Vit-D Deficiency, Fractures)  8. Encounter for screening mammogram for breast cancer  - MM Digital Screening; Future

## 2022-05-31 ENCOUNTER — Ambulatory Visit (INDEPENDENT_AMBULATORY_CARE_PROVIDER_SITE_OTHER): Payer: Medicare HMO | Admitting: Family Medicine

## 2022-05-31 ENCOUNTER — Encounter: Payer: Self-pay | Admitting: Family Medicine

## 2022-05-31 VITALS — BP 128/78 | HR 71 | Resp 16 | Ht 65.0 in | Wt 159.0 lb

## 2022-05-31 DIAGNOSIS — E039 Hypothyroidism, unspecified: Secondary | ICD-10-CM

## 2022-05-31 DIAGNOSIS — M85852 Other specified disorders of bone density and structure, left thigh: Secondary | ICD-10-CM | POA: Diagnosis not present

## 2022-05-31 DIAGNOSIS — J3089 Other allergic rhinitis: Secondary | ICD-10-CM

## 2022-05-31 DIAGNOSIS — G4733 Obstructive sleep apnea (adult) (pediatric): Secondary | ICD-10-CM

## 2022-05-31 DIAGNOSIS — E7800 Pure hypercholesterolemia, unspecified: Secondary | ICD-10-CM | POA: Diagnosis not present

## 2022-05-31 DIAGNOSIS — Z1231 Encounter for screening mammogram for malignant neoplasm of breast: Secondary | ICD-10-CM

## 2022-05-31 DIAGNOSIS — G2581 Restless legs syndrome: Secondary | ICD-10-CM

## 2022-05-31 DIAGNOSIS — I10 Essential (primary) hypertension: Secondary | ICD-10-CM

## 2022-06-01 LAB — CBC WITH DIFFERENTIAL/PLATELET
Absolute Monocytes: 271 cells/uL (ref 200–950)
Basophils Absolute: 13 cells/uL (ref 0–200)
Basophils Relative: 0.2 %
Eosinophils Absolute: 82 cells/uL (ref 15–500)
Eosinophils Relative: 1.3 %
HCT: 39.2 % (ref 35.0–45.0)
Hemoglobin: 13.5 g/dL (ref 11.7–15.5)
Lymphs Abs: 1285 cells/uL (ref 850–3900)
MCH: 31.8 pg (ref 27.0–33.0)
MCHC: 34.4 g/dL (ref 32.0–36.0)
MCV: 92.2 fL (ref 80.0–100.0)
MPV: 10.9 fL (ref 7.5–12.5)
Monocytes Relative: 4.3 %
Neutro Abs: 4649 cells/uL (ref 1500–7800)
Neutrophils Relative %: 73.8 %
Platelets: 158 10*3/uL (ref 140–400)
RBC: 4.25 10*6/uL (ref 3.80–5.10)
RDW: 12.1 % (ref 11.0–15.0)
Total Lymphocyte: 20.4 %
WBC: 6.3 10*3/uL (ref 3.8–10.8)

## 2022-06-01 LAB — COMPLETE METABOLIC PANEL WITH GFR
AG Ratio: 1.6 (calc) (ref 1.0–2.5)
ALT: 17 U/L (ref 6–29)
AST: 22 U/L (ref 10–35)
Albumin: 4.2 g/dL (ref 3.6–5.1)
Alkaline phosphatase (APISO): 43 U/L (ref 37–153)
BUN: 20 mg/dL (ref 7–25)
CO2: 27 mmol/L (ref 20–32)
Calcium: 8.8 mg/dL (ref 8.6–10.4)
Chloride: 107 mmol/L (ref 98–110)
Creat: 0.81 mg/dL (ref 0.60–1.00)
Globulin: 2.6 g/dL (calc) (ref 1.9–3.7)
Glucose, Bld: 89 mg/dL (ref 65–99)
Potassium: 3.9 mmol/L (ref 3.5–5.3)
Sodium: 141 mmol/L (ref 135–146)
Total Bilirubin: 0.6 mg/dL (ref 0.2–1.2)
Total Protein: 6.8 g/dL (ref 6.1–8.1)
eGFR: 76 mL/min/{1.73_m2} (ref >=60)

## 2022-06-01 LAB — TSH: TSH: 1.35 mIU/L (ref 0.40–4.50)

## 2022-06-01 LAB — LIPID PANEL
Cholesterol: 147 mg/dL (ref <200)
HDL: 56 mg/dL (ref >=50)
LDL Cholesterol (Calc): 75 mg/dL (calc)
Non-HDL Cholesterol (Calc): 91 mg/dL (calc) (ref <130)
Total CHOL/HDL Ratio: 2.6 (calc) (ref <5.0)
Triglycerides: 82 mg/dL (ref <150)

## 2022-06-01 LAB — VITAMIN D 25 HYDROXY (VIT D DEFICIENCY, FRACTURES): Vit D, 25-Hydroxy: 61 ng/mL (ref 30–100)

## 2022-08-03 ENCOUNTER — Other Ambulatory Visit: Payer: Self-pay | Admitting: Family Medicine

## 2022-08-03 ENCOUNTER — Ambulatory Visit: Payer: Medicare HMO | Admitting: Urology

## 2022-08-03 DIAGNOSIS — E7800 Pure hypercholesterolemia, unspecified: Secondary | ICD-10-CM

## 2022-08-03 DIAGNOSIS — E039 Hypothyroidism, unspecified: Secondary | ICD-10-CM

## 2022-08-16 ENCOUNTER — Ambulatory Visit: Payer: Medicare HMO | Admitting: Urology

## 2022-08-16 VITALS — BP 133/83 | HR 59 | Ht 65.0 in | Wt 158.5 lb

## 2022-08-16 DIAGNOSIS — K5909 Other constipation: Secondary | ICD-10-CM

## 2022-08-16 DIAGNOSIS — R8290 Unspecified abnormal findings in urine: Secondary | ICD-10-CM | POA: Diagnosis not present

## 2022-08-16 DIAGNOSIS — N393 Stress incontinence (female) (male): Secondary | ICD-10-CM

## 2022-08-16 DIAGNOSIS — R3915 Urgency of urination: Secondary | ICD-10-CM

## 2022-08-16 DIAGNOSIS — N3941 Urge incontinence: Secondary | ICD-10-CM | POA: Diagnosis not present

## 2022-08-16 LAB — URINALYSIS, COMPLETE
Bilirubin, UA: NEGATIVE
Glucose, UA: NEGATIVE
Ketones, UA: NEGATIVE
Nitrite, UA: NEGATIVE
Protein,UA: NEGATIVE
Specific Gravity, UA: 1.015 (ref 1.005–1.030)
Urobilinogen, Ur: 0.2 mg/dL (ref 0.2–1.0)
pH, UA: 6 (ref 5.0–7.5)

## 2022-08-16 LAB — MICROSCOPIC EXAMINATION: Epithelial Cells (non renal): >10 /hpf — AB (ref 0–10)

## 2022-08-16 LAB — BLADDER SCAN AMB NON-IMAGING: Scan Result: 23

## 2022-08-16 NOTE — Progress Notes (Signed)
I, Amy L Pierron,acting as a scribe for Vanna Scotland, MD.,have documented all relevant documentation on the behalf of Vanna Scotland, MD,as directed by  Vanna Scotland, MD while in the presence of Vanna Scotland, MD.  08/16/2022 11:40 AM   Paige [name] Sep 30, 1945 [id number]  Referring provider: Alba Cory, MD [address] [place],  [place] [address]  Chief Complaint  Patient presents with   Establish Care   Urinary Incontinence    HPI: 77 year-old female who presents today for evaluation of her urinary issues.   Notably, she mentions that her symptoms resolved last week. Her urinalysis is a bit suspicious with 6-10 WBCs, 3-10 RBCs, greater than 10 epithelial cells.  She reports a personal history of leakage and urgency for years. The leakage occurs when she doesn't make it to the bathroom in time not when she coughs or sneezes. The symptoms peaked about a month ago and included nocturia x4 one night. This week they have subsided mostly.   Denies burning, hematuria, vaginal issues, and UTI's.   She does have constipation on occasion and does nothing specific to manage it.  Results for orders placed or performed in visit on 08/16/22  Microscopic Examination   Urine  Result Value Ref Range   WBC, UA 6-10 (A) 0 - 5 /hpf   RBC, Urine 3-10 (A) 0 - 2 /hpf   Epithelial Cells (non renal) >10 (A) 0 - 10 /hpf   Bacteria, UA Moderate (A) None seen/Few  Urinalysis, Complete  Result Value Ref Range   Specific Gravity, UA 1.015 1.005 - 1.030   pH, UA 6.0 5.0 - 7.5   Color, UA Yellow Yellow   Appearance Ur Hazy (A) Clear   Leukocytes,UA 1+ (A) Negative   Protein,UA Negative Negative/Trace   Glucose, UA Negative Negative   Ketones, UA Negative Negative   RBC, UA Trace (A) Negative   Bilirubin, UA Negative Negative   Urobilinogen, Ur 0.2 0.2 - 1.0 mg/dL   Nitrite, UA Negative Negative   Microscopic Examination See below:   Bladder Scan (Post Void Residual)  in office  Result Value Ref Range   Scan Result 23 ml     PMH: Past Medical History:  Diagnosis Date   Hyperlipidemia    Hypertension    Thyroid disease     Home Medications:  Allergies as of 08/16/2022       Reactions   Codeine Nausea And Vomiting        Medication List        Accurate as of August 16, 2022 11:40 AM. If you have any questions, ask your nurse or doctor.          aspirin 81 MG tablet Take 1 tablet by mouth daily.   Calcium-Phosphorus-Vitamin D 100-50-100 MG-MG-UNIT Chew Chew 1 tablet by mouth.   Coenzyme Q10 100 MG Tabs Take 100 mg by mouth daily.   cyanocobalamin 100 MCG tablet Take 1,000 mcg by mouth daily.   diazepam 5 MG tablet Commonly known as: Valium Take 1 tablet (5 mg total) by mouth every 6 (six) hours as needed for anxiety. Prior to procedures   Fish Oil Burp-Less 1200 MG Caps Take 1 capsule by mouth.   glucosamine-chondroitin 500-400 MG tablet Take 1 tablet by mouth 3 (three) times daily.   HAIR SKIN AND NAILS FORMULA PO Take 1 tablet by mouth daily.   levothyroxine 88 MCG tablet Commonly known as: SYNTHROID TAKE 1 TABLET EVERY DAY BEFORE BREAKFAST  losartan 25 MG tablet Commonly known as: COZAAR TAKE 1 TABLET EVERY DAY   MULTIPLE VITAMIN PO Take by mouth daily.   rosuvastatin 20 MG tablet Commonly known as: CRESTOR TAKE 1 TABLET EVERY DAY        Allergies:  Allergies  Allergen Reactions   Codeine Nausea And Vomiting    Family History: Family History  Problem Relation Age of Onset   Cancer Mother    Diabetes Father    Stroke Father    Breast cancer Neg Hx     Social History:  reports that she quit smoking about 45 years ago. Her smoking use included cigarettes. She started smoking about 50 years ago. She has a 1.25 pack-year smoking history. She has never used smokeless tobacco. She reports that she does not drink alcohol and does not use drugs.   Physical Exam: BP 133/83   Pulse (!) 59   Ht 5'  5" (1.651 m)   Wt 158 lb 8 oz (71.9 kg)   BMI 26.38 kg/m   Constitutional:  Alert and oriented, No acute distress. HEENT:  AT, moist mucus membranes.  Trachea midline, no masses. Neurologic: Grossly intact, no focal deficits, moving all 4 extremities. Psychiatric: Normal mood and affect.  Laboratory Data: Urinalysis    Component Value Date/Time   APPEARANCEUR Hazy (A) 08/16/2022 0843   GLUCOSEU Negative 08/16/2022 0843   BILIRUBINUR Negative 08/16/2022 0843   PROTEINUR Negative 08/16/2022 0843   NITRITE Negative 08/16/2022 0843   LEUKOCYTESUR 1+ (A) 08/16/2022 0843    Lab Results  Component Value Date   LABMICR See below: 08/16/2022   WBCUA 6-10 (A) 08/16/2022   LABEPIT >10 (A) 08/16/2022   BACTERIA Moderate (A) 08/16/2022    Assessment & Plan:    Urge incontinence  -  Baseline mild; little bother. Symptoms vastly increased last month, but have currently resolved. She may have had a low-grade infection. She's currently back to her baseline. We discussed whether or not to start medication. She declined at this time.  2. Chronic constipation  -  We discussed the relationship between bowel and bladder issues. Encouraged her to have one soft, well formed bowel movement daily. Suggested trying MiraLax if needed.  3. Abnormal urinalysis  -  Appears primarily contaminated. Will send a urine culture today to ensure it's not infected.   - Have her come back later this week, Thursday or Friday, for a repeat drop off. Gave her a cup and a wipe to take with her.  - If her urine sample is abnormal or there's still presence of microscopic hematuria, especially in the absence of infection, we'd pursue microscopic hematuria evaluation. She's agreeable to this plan.  Return if symptoms worsen or fail to improve.  I have reviewed the above documentation for accuracy and completeness, and I agree with the above.   Vanna Scotland, MD   [facility] [address], [address] [place], [place] [address] [phone]

## 2022-08-17 ENCOUNTER — Other Ambulatory Visit: Payer: Medicare HMO

## 2022-08-17 DIAGNOSIS — N393 Stress incontinence (female) (male): Secondary | ICD-10-CM

## 2022-08-17 DIAGNOSIS — R8290 Unspecified abnormal findings in urine: Secondary | ICD-10-CM | POA: Diagnosis not present

## 2022-08-17 DIAGNOSIS — K5909 Other constipation: Secondary | ICD-10-CM | POA: Diagnosis not present

## 2022-08-17 DIAGNOSIS — R3915 Urgency of urination: Secondary | ICD-10-CM | POA: Diagnosis not present

## 2022-08-17 LAB — URINALYSIS, COMPLETE
Bilirubin, UA: NEGATIVE
Glucose, UA: NEGATIVE
Ketones, UA: NEGATIVE
Leukocytes,UA: NEGATIVE
Nitrite, UA: NEGATIVE
Protein,UA: NEGATIVE
RBC, UA: NEGATIVE
Specific Gravity, UA: 1.01 (ref 1.005–1.030)
Urobilinogen, Ur: 0.2 mg/dL (ref 0.2–1.0)
pH, UA: 6.5 (ref 5.0–7.5)

## 2022-08-17 LAB — MICROSCOPIC EXAMINATION

## 2022-08-19 DIAGNOSIS — F4321 Adjustment disorder with depressed mood: Secondary | ICD-10-CM | POA: Diagnosis not present

## 2022-08-19 LAB — CULTURE, URINE COMPREHENSIVE

## 2022-08-24 ENCOUNTER — Other Ambulatory Visit: Payer: Self-pay | Admitting: Family Medicine

## 2022-08-24 DIAGNOSIS — Z1231 Encounter for screening mammogram for malignant neoplasm of breast: Secondary | ICD-10-CM

## 2022-08-26 DIAGNOSIS — F4321 Adjustment disorder with depressed mood: Secondary | ICD-10-CM | POA: Diagnosis not present

## 2022-09-09 DIAGNOSIS — F4321 Adjustment disorder with depressed mood: Secondary | ICD-10-CM | POA: Diagnosis not present

## 2022-09-23 DIAGNOSIS — F4321 Adjustment disorder with depressed mood: Secondary | ICD-10-CM | POA: Diagnosis not present

## 2022-09-30 DIAGNOSIS — F4321 Adjustment disorder with depressed mood: Secondary | ICD-10-CM | POA: Diagnosis not present

## 2022-10-09 DIAGNOSIS — D2272 Melanocytic nevi of left lower limb, including hip: Secondary | ICD-10-CM | POA: Diagnosis not present

## 2022-10-09 DIAGNOSIS — D044 Carcinoma in situ of skin of scalp and neck: Secondary | ICD-10-CM | POA: Diagnosis not present

## 2022-10-09 DIAGNOSIS — D485 Neoplasm of uncertain behavior of skin: Secondary | ICD-10-CM | POA: Diagnosis not present

## 2022-10-09 DIAGNOSIS — D2262 Melanocytic nevi of left upper limb, including shoulder: Secondary | ICD-10-CM | POA: Diagnosis not present

## 2022-10-09 DIAGNOSIS — D225 Melanocytic nevi of trunk: Secondary | ICD-10-CM | POA: Diagnosis not present

## 2022-10-09 DIAGNOSIS — D2261 Melanocytic nevi of right upper limb, including shoulder: Secondary | ICD-10-CM | POA: Diagnosis not present

## 2022-10-09 DIAGNOSIS — D2271 Melanocytic nevi of right lower limb, including hip: Secondary | ICD-10-CM | POA: Diagnosis not present

## 2022-10-09 DIAGNOSIS — L821 Other seborrheic keratosis: Secondary | ICD-10-CM | POA: Diagnosis not present

## 2022-10-21 DIAGNOSIS — F4321 Adjustment disorder with depressed mood: Secondary | ICD-10-CM | POA: Diagnosis not present

## 2022-11-04 DIAGNOSIS — F4321 Adjustment disorder with depressed mood: Secondary | ICD-10-CM | POA: Diagnosis not present

## 2022-11-09 DIAGNOSIS — L905 Scar conditions and fibrosis of skin: Secondary | ICD-10-CM | POA: Diagnosis not present

## 2022-11-09 DIAGNOSIS — D2339 Other benign neoplasm of skin of other parts of face: Secondary | ICD-10-CM | POA: Diagnosis not present

## 2022-11-09 DIAGNOSIS — D0439 Carcinoma in situ of skin of other parts of face: Secondary | ICD-10-CM | POA: Diagnosis not present

## 2022-11-18 DIAGNOSIS — F4321 Adjustment disorder with depressed mood: Secondary | ICD-10-CM | POA: Diagnosis not present

## 2022-11-22 NOTE — Progress Notes (Unsigned)
Name: Paige [name]   MRN: [id number]    DOB: 1945/11/28   Date:11/23/2022       Progress Note  Subjective  Chief Complaint  Annual Exam and Follow-Up  HPI  Patient presents for annual CPE and follow-up.  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 We will obtain records   Hyperlipidemia: she is now on Rosuvastatin in place of Pravastatin , she denies myalgia ,last LDL 75, good cholesterol improved - up from 45 to 56  . We will recheck labs   HTN: She is on Losartan 25 mg and doing well, BP is at goal 130/70  no chest pain or palpitation  or decrease in exercise tolerance. She very seldom has orthostatic changes    Hypothyroidism: she has been on levothyroxine for 88 mcg  daily and half on Sundays, last TSH was at goal.  She denies hair loss, dry skin or change in bowel movements, occasionally feels constipated that resolves with increase in fiber intake. Doing well  Trigger Finger: ring finger right hand, intermittent, reassurance given for now    OSA : diagnosed by Dr. Welton Flakes , she was on CPAP but is doing well since weight loss and has been off CPAP machine, however she has PLM and RLS , her ferritin level was normal . She does not have symptoms , she denies problems sleeping or problems with RLS   Osteopenia: she is taking calcium and vitamin D - scheduled for August  5th 2024   FRAX* RESULTS:  (version: 3.5) 10-year Probability of Fracture1 Major Osteoporotic Fracture2 Hip Fracture 10.7% 1.9%  Overweight: she joined weight Watchers in October 2021 her original weight 184 lbs and weight has been stable since January 2023 around 150-155 lbs , today is up to 159 lbs. She wants to stay below 160 lbs , today weight is 161.4 lbs    Diet: eats mostly at home, eats a lot of fruit and vegetables and continues to follow Weight Watchers  Exercise:  continue regular activity , going to the gym twice a week and  walks the dog  Last Eye Exam: up to date  Last Dental Exam: up to date    [employer] Visit from [facility]/02/2022 in [facility]  AUDIT-C Score 0      Depression: Phq 9 is  negative    11/23/2022   10:20 AM 05/31/2022    8:02 AM 05/02/2022    9:20 AM 11/28/2021    8:31 AM 05/31/2021    9:19 AM  Depression screen PHQ 2/9  Decreased Interest 0 0 0 0 0  Down, Depressed, Hopeless 0 0 0 0 0  PHQ - 2 Score 0 0 0 0 0  Altered sleeping 0 0 0 0 0  Tired, decreased energy 0 0 0 0 0  Change in appetite 0 0 0 0 0  Feeling bad or failure about yourself  0 0 0 0 0  Trouble concentrating 0 0 0 0 0  Moving slowly or fidgety/restless 0 0 0 0 0  Suicidal thoughts 0 0 0 0 0  PHQ-9 Score 0 0 0 0 0  Difficult doing work/chores   Not difficult at all Not difficult at all    Hypertension: BP Readings from Last 3 Encounters:  11/23/22 130/70  08/16/22 133/83  05/31/22 128/78   Obesity: Wt Readings from Last 3 Encounters:  11/23/22 161  lb 6.4 oz (73.2 kg)  08/16/22 158 lb 8 oz (71.9 kg)  05/31/22 159 lb (72.1 kg)   BMI Readings from Last 3 Encounters:  11/23/22 26.45 kg/m  08/16/22 26.38 kg/m  05/31/22 26.46 kg/m     Vaccines:   HPV: N/A Tdap: N/A Shingrix: N/A Pneumonia: up to date Flu: up to date COVID-19: up to date   Hep C Screening: 09/06/17 STD testing and prevention (HIV/chl/gon/syphilis): N/A Intimate partner violence: negative screen  Sexual History : husband has ED , not sexually active  Menstrual History/LMP/Abnormal Bleeding: post-menopausal  Discussed importance of follow up if any post-menopausal bleeding: yes  Incontinence Symptoms: seeing urologist, symptoms are mild and does not want to take medications at this time   Breast cancer:  - Last Mammogram: Scheduled for 12/04/22 - BRCA gene screening: N/A  Osteoporosis Prevention : Discussed high calcium and vitamin D supplementation, weight bearing exercises Bone density: Scheduled for  12/04/22   Cervical cancer screening: N/A  Skin cancer: continue follow up with Dermatologist  Colorectal cancer: 09/29/20   Lung cancer:  Low Dose CT Chest recommended if Age 76-80 years, 20 pack-year currently smoking OR have quit w/in 15years. Patient does not qualify for screen   ECG: 02/26/18  Advanced Care Planning: A voluntary discussion about advance care planning including the explanation and discussion of advance directives.  Discussed health care proxy and Living will, and the patient was able to identify a health care proxy as husband .  Patient has  a living will and power of attorney of health care   Lipids: Lab Results  Component Value Date   CHOL 147 05/31/2022   CHOL 140 05/31/2021   CHOL 137 08/06/2020   Lab Results  Component Value Date   HDL 56 05/31/2022   HDL 45 (L) 05/31/2021   HDL 50 08/06/2020   Lab Results  Component Value Date   LDLCALC 75 05/31/2022   LDLCALC 76 05/31/2021   LDLCALC 71 08/06/2020   Lab Results  Component Value Date   TRIG 82 05/31/2022   TRIG 111 05/31/2021   TRIG 82 08/06/2020   Lab Results  Component Value Date   CHOLHDL 2.6 05/31/2022   CHOLHDL 3.1 05/31/2021   CHOLHDL 2.7 08/06/2020   No results found for: "LDLDIRECT"  Glucose: Glucose, Bld  Date Value Ref Range Status  05/31/2022 89 65 - 99 mg/dL Final    Comment:    .            Fasting reference interval .   05/31/2021 84 65 - 99 mg/dL Final    Comment:    .            Fasting reference interval .   08/06/2020 94 65 - 99 mg/dL Final    Comment:    .            Fasting reference interval .     Patient Active Problem List   Diagnosis Date Noted   RLS (restless legs syndrome) 11/28/2021   Pure hypercholesterolemia 11/28/2021   OSA (obstructive sleep apnea) 02/05/2020   Essential hypertension 08/28/2018   Osteopenia 08/28/2018   Hyperlipidemia 10/27/2014   Allergic rhinitis 10/27/2014   Adult hypothyroidism 10/13/2014    History reviewed. No  pertinent surgical history.  Family History  Problem Relation Age of Onset   Cancer Mother    Diabetes Father    Stroke Father    Breast cancer Neg Hx     Social History  Socioeconomic History   Marital status: Married    Spouse name: Not on file   Number of children: 0   Years of education: Not on file   Highest education level: High school graduate  Occupational History   Occupation: Retired   Occupation: Psychologist, sport and exercise  Tobacco Use   Smoking status: Former    Current packs/day: 0.00    Average packs/day: 0.3 packs/day for 5.0 years (1.3 ttl pk-yrs)    Types: Cigarettes    Start date: 05/01/1972    Quit date: 05/01/1977    Years since quitting: 45.5   Smokeless tobacco: Never  Vaping Use   Vaping status: Never Used  Substance and Sexual Activity   Alcohol use: No   Drug use: No   Sexual activity: Yes    Partners: Male  Other Topics Concern   Not on file  Social History Narrative   Second marriage, widow from first marriage at age 76 and her current husband had two teenagers who she help raise    Social Determinants of Health   Financial Resource Strain: Low Risk  (04/26/2021)   Overall Financial Resource Strain (CARDIA)    Difficulty of Paying Living Expenses: Not hard at all  Food Insecurity: No Food Insecurity (04/26/2021)   Hunger Vital Sign    Worried About Running Out of Food in the Last Year: Never true    Ran Out of Food in the Last Year: Never true  Transportation Needs: No Transportation Needs (04/26/2021)   PRAPARE - Administrator, Civil Service (Medical): No    Lack of Transportation (Non-Medical): No  Physical Activity: Sufficiently Active (04/26/2021)   Exercise Vital Sign    Days of Exercise per Week: 7 days    Minutes of Exercise per Session: 30 min  Stress: No Stress Concern Present (04/26/2021)   [employer] of Occupational Health - Occupational Stress Questionnaire    Feeling of Stress : Not at all  Social Connections:  Socially Integrated (04/26/2021)   Social Connection and Isolation Panel [NHANES]    Frequency of Communication with Friends and Family: More than three times a week    Frequency of Social Gatherings with Friends and Family: More than three times a week    Attends Religious Services: More than 4 times per year    Active Member of [employer] or Organizations: Yes    Attends Engineer, structural: More than 4 times per year    Marital Status: Married  Catering manager Violence: Not At Risk (04/26/2021)   Humiliation, Afraid, Rape, and Kick questionnaire    Fear of Current or Ex-Partner: No    Emotionally Abused: No    Physically Abused: No    Sexually Abused: No     Current Outpatient Medications:    aspirin 81 MG tablet, Take 1 tablet by mouth daily., Disp: , Rfl:    Calcium-Phosphorus-Vitamin D 100-50-100 MG-MG-UNIT CHEW, Chew 1 tablet by mouth., Disp: , Rfl:    Coenzyme Q10 100 MG TABS, Take 100 mg by mouth daily., Disp: , Rfl:    cyanocobalamin 100 MCG tablet, Take 1,000 mcg by mouth daily. , Disp: , Rfl:    glucosamine-chondroitin 500-400 MG tablet, Take 1 tablet by mouth 3 (three) times daily., Disp: , Rfl:    levothyroxine (SYNTHROID) 88 MCG tablet, TAKE 1 TABLET EVERY DAY BEFORE BREAKFAST, Disp: 90 tablet, Rfl: 3   losartan (COZAAR) 25 MG tablet, TAKE 1 TABLET EVERY DAY, Disp: 90 tablet, Rfl: 3  MULTIPLE VITAMIN PO, Take by mouth daily., Disp: , Rfl:    Multiple Vitamins-Minerals (HAIR SKIN AND NAILS FORMULA PO), Take 1 tablet by mouth daily., Disp: , Rfl:    Omega-3 Fatty Acids (FISH OIL BURP-LESS) 1200 MG CAPS, Take 1 capsule by mouth., Disp: , Rfl:    rosuvastatin (CRESTOR) 20 MG tablet, TAKE 1 TABLET EVERY DAY, Disp: 90 tablet, Rfl: 3   diazepam (VALIUM) 5 MG tablet, Take 1 tablet (5 mg total) by mouth every 6 (six) hours as needed for anxiety. Prior to procedures (Patient not taking: Reported on 11/23/2022), Disp: 4 tablet, Rfl: 0  Allergies  Allergen Reactions    Codeine Nausea And Vomiting     ROS  Constitutional: Negative for fever or weight change.  Respiratory: Negative for cough and shortness of breath.   Cardiovascular: Negative for chest pain or palpitations.  Gastrointestinal: Negative for abdominal pain, no bowel changes.  Musculoskeletal: Negative for gait problem or joint swelling.  Skin: Negative for rash.  Neurological: Negative for dizziness or headache.  No other specific complaints in a complete review of systems (except as listed in HPI above).   Objective  Vitals:   11/23/22 1018  BP: 130/70  Pulse: 63  Resp: 14  Temp: 98.2 F (36.8 C)  TempSrc: Oral  SpO2: 99%  Weight: 161 lb 6.4 oz (73.2 kg)  Height: 5' 5.5" (1.664 m)    Body mass index is 26.45 kg/m.  Physical Exam  Constitutional: Patient appears well-developed and well-nourished. No distress.  HENT: Head: Normocephalic and atraumatic. Ears: B TMs ok, no erythema or effusion; Nose: Nose normal. Mouth/Throat: Oropharynx is clear and moist. No oropharyngeal exudate.  Eyes: Conjunctivae and EOM are normal. Pupils are equal, round, and reactive to light. No scleral icterus.  Neck: Normal range of motion. Neck supple. No JVD present. No thyromegaly present.  Cardiovascular: Normal rate, regular rhythm and normal heart sounds.  No murmur heard. No BLE edema. Pulmonary/Chest: Effort normal and breath sounds normal. No respiratory distress. Abdominal: Soft. Bowel sounds are normal, no distension. There is no tenderness. no masses Breast: no lumps or masses, no nipple discharge or rashes FEMALE GENITALIA:  External genitalia normal External urethra normal Vaginal vault normal without discharge or lesions Cervix normal without discharge or lesions Bimanual exam normal without masses RECTAL: not done  Musculoskeletal: Normal range of motion, no joint effusions. No gross deformities Neurological: he is alert and oriented to person, place, and time. No cranial nerve  deficit. Coordination, balance, strength, speech and gait are normal.  Skin: Skin is warm and dry. No rash noted. No erythema.  Psychiatric: Patient has a normal mood and affect. behavior is normal. Judgment and thought content normal.   Fall Risk:    11/23/2022   10:20 AM 05/31/2022    8:02 AM 05/02/2022    9:20 AM 11/28/2021    8:31 AM 05/31/2021    9:19 AM  Fall Risk   Falls in the past year? 0 0 1 0 0  Number falls in past yr:  0 0 0 0  Injury with Fall?  0 0 0 0  Risk for fall due to : No Fall Risks No Fall Risks Impaired balance/gait No Fall Risks No Fall Risks  Follow up Falls prevention discussed;Education provided;Falls evaluation completed Falls prevention discussed Falls prevention discussed;Education provided;Falls evaluation completed Falls prevention discussed;Education provided Falls prevention discussed     Functional Status Survey: Is the patient deaf or have difficulty hearing?: No Does the patient  have difficulty seeing, even when wearing glasses/contacts?: No Does the patient have difficulty concentrating, remembering, or making decisions?: No Does the patient have difficulty walking or climbing stairs?: No Does the patient have difficulty dressing or bathing?: No Does the patient have difficulty doing errands alone such as visiting a doctor's office or shopping?: No   Assessment & Plan  1. Well adult exam   2. Adult hypothyroidism  Continue current dose of levothyroxine   3. Essential hypertension  Bp is at goal, continue medications   4. OSA (obstructive sleep apnea)  Compliant   5. Pure hypercholesterolemia  Reviewed labs with patient  6. Osteopenia of neck of left femur  Bone density already scheduled    -USPSTF grade A and B recommendations reviewed with patient; age-appropriate recommendations, preventive care, screening tests, etc discussed and encouraged; healthy living encouraged; see AVS for patient education given to patient -Discussed  importance of 150 minutes of physical activity weekly, eat two servings of fish weekly, eat one serving of tree nuts ( cashews, pistachios, pecans, almonds.Marland Kitchen) every other day, eat 6 servings of fruit/vegetables daily and drink plenty of water and avoid sweet beverages.   -Reviewed Health Maintenance: Yes.

## 2022-11-23 ENCOUNTER — Encounter: Payer: Self-pay | Admitting: Family Medicine

## 2022-11-23 ENCOUNTER — Ambulatory Visit (INDEPENDENT_AMBULATORY_CARE_PROVIDER_SITE_OTHER): Payer: Medicare HMO | Admitting: Family Medicine

## 2022-11-23 VITALS — BP 130/70 | HR 63 | Temp 98.2°F | Resp 14 | Ht 65.5 in | Wt 161.4 lb

## 2022-11-23 DIAGNOSIS — G4733 Obstructive sleep apnea (adult) (pediatric): Secondary | ICD-10-CM | POA: Diagnosis not present

## 2022-11-23 DIAGNOSIS — Z0000 Encounter for general adult medical examination without abnormal findings: Secondary | ICD-10-CM

## 2022-11-23 DIAGNOSIS — I10 Essential (primary) hypertension: Secondary | ICD-10-CM

## 2022-11-23 DIAGNOSIS — E7800 Pure hypercholesterolemia, unspecified: Secondary | ICD-10-CM | POA: Diagnosis not present

## 2022-11-23 DIAGNOSIS — M85852 Other specified disorders of bone density and structure, left thigh: Secondary | ICD-10-CM

## 2022-11-23 DIAGNOSIS — E039 Hypothyroidism, unspecified: Secondary | ICD-10-CM

## 2022-12-04 ENCOUNTER — Ambulatory Visit
Admission: RE | Admit: 2022-12-04 | Discharge: 2022-12-04 | Disposition: A | Discharge status: Home / Self Care | Payer: Medicare HMO | Source: Ambulatory Visit | Attending: Family Medicine | Admitting: Family Medicine

## 2022-12-04 DIAGNOSIS — Z1231 Encounter for screening mammogram for malignant neoplasm of breast: Secondary | ICD-10-CM | POA: Insufficient documentation

## 2022-12-04 DIAGNOSIS — M85852 Other specified disorders of bone density and structure, left thigh: Secondary | ICD-10-CM | POA: Diagnosis not present

## 2022-12-04 DIAGNOSIS — Z780 Asymptomatic menopausal state: Secondary | ICD-10-CM | POA: Diagnosis not present

## 2022-12-11 ENCOUNTER — Encounter: Payer: Self-pay | Admitting: Family Medicine

## 2023-04-09 NOTE — Progress Notes (Signed)
Name: Paige [name]   MRN: [id number]    DOB: 01/24/1946   Date:04/26/2023       Progress Note  Subjective  Chief Complaint  Chief Complaint  Patient presents with   Medical Management of Chronic Issues    HPI  Discussed the use of AI scribe software for clinical note transcription with the patient, who gave verbal consent to proceed.  History of Present Illness   The patient, with a history of hyperlipidemia, hypothyroidism, osteopenia, and hypertension, presents for a routine follow-up. She reports feeling well overall, with no new health concerns since the last visit.   The patient denies any symptoms of restless leg syndrome, despite a previous diagnosis. She occasionally experiences cramping if on her feet all day, but it does not disrupt her sleep.  Regarding osteopenia, the patient's bone density has shown a slight decrease, but remains within the normal range. She has been adhering to a high calcium diet, physical activity, and vitamin D supplementation to maintain bone health.  The patient's cholesterol has been well-managed with rosuvastatin 20, with LDL levels consistently between 70 and 75. She also takes levothyroxine 88 micrograms for hypothyroidism, with no reported side effects such as swallowing difficulties, hair loss, dry skin, or bowel changes.  The patient's hypertension is managed with losartan 25 mg, with no reported side effects such as chest pain or palpitations. She has a history of skin cancer and is under regular dermatological surveillance.  The patient also reports a history of trigger finger, which has resolved on right ring finger but now occurs on right middle finger intermittently. She has not sought treatment as it is not significantly bothersome.  Previously diagnosed with obstructive sleep apnea, the patient has discontinued use of her CPAP machine following significant weight loss. She has lost approximately 20 pounds since starting [employer], although she reports a recent slight weight gain over the holidays.  The patient also reports postnasal drip, managed with over-the-counter allergy medication as needed. She has received her flu shot for the season but has not yet received the RSV vaccine.         Patient Active Problem List   Diagnosis Date Noted   OSA (obstructive sleep apnea) 02/05/2020   Essential hypertension 08/28/2018   Osteopenia 08/28/2018   Hyperlipidemia 10/27/2014   Allergic rhinitis 10/27/2014   Adult hypothyroidism 10/13/2014    History reviewed. No pertinent surgical history.  Family History  Problem Relation Age of Onset   Cancer Mother    Diabetes Father    Stroke Father    Breast cancer Neg Hx     Social History   Tobacco Use   Smoking status: Former    Current packs/day: 0.00    Average packs/day: 0.3 packs/day for 5.0 years (1.3 ttl pk-yrs)    Types: Cigarettes    Start date: 05/01/1972    Quit date: 05/01/1977    Years since quitting: 46.0   Smokeless tobacco: Never  Substance Use Topics   Alcohol use: No     Current Outpatient Medications:    aspirin 81 MG tablet, Take 1 tablet by mouth daily., Disp: , Rfl:    Calcium-Phosphorus-Vitamin D 100-50-100 MG-MG-UNIT CHEW, Chew 1 tablet by mouth., Disp: , Rfl:    Coenzyme Q10 100 MG TABS, Take 100 mg by mouth daily., Disp: , Rfl:    cyanocobalamin 100 MCG tablet, Take 1,000 mcg by mouth daily. , Disp: , Rfl:    glucosamine-chondroitin 500-400 MG tablet, Take 1 tablet  by mouth 3 (three) times daily., Disp: , Rfl:    levothyroxine (SYNTHROID) 88 MCG tablet, TAKE 1 TABLET EVERY DAY BEFORE BREAKFAST, Disp: 90 tablet, Rfl: 3   losartan (COZAAR) 25 MG tablet, TAKE 1 TABLET EVERY DAY, Disp: 90 tablet, Rfl: 3   MULTIPLE VITAMIN PO, Take by mouth daily., Disp: , Rfl:    Multiple Vitamins-Minerals (HAIR SKIN AND NAILS FORMULA PO), Take 1 tablet by mouth daily., Disp: , Rfl:    Omega-3 Fatty Acids (FISH OIL BURP-LESS) 1200 MG CAPS, Take 1  capsule by mouth., Disp: , Rfl:    rosuvastatin (CRESTOR) 20 MG tablet, TAKE 1 TABLET EVERY DAY, Disp: 90 tablet, Rfl: 3  Allergies  Allergen Reactions   Codeine Nausea And Vomiting    I personally reviewed active problem list, medication list, allergies, family history with the patient/caregiver today.   ROS  Ten systems reviewed and is negative except as mentioned in HPI    Objective  Vitals:   04/26/23 0912  BP: 126/78  Pulse: (!) 56  Resp: 16  Temp: 97.7 F (36.5 C)  TempSrc: Oral  SpO2: 100%  Weight: 164 lb 8 oz (74.6 kg)  Height: 5' 5.5" (1.664 m)    Body mass index is 26.96 kg/m.  Physical Exam  Constitutional: Patient appears well-developed and well-nourished.  No distress.  HEENT: head atraumatic, normocephalic, pupils equal and reactive to light, neck supple Cardiovascular: Normal rate, regular rhythm and normal heart sounds.  No murmur heard. No BLE edema. Pulmonary/Chest: Effort normal and breath sounds normal. No respiratory distress. Abdominal: Soft.  There is no tenderness. Psychiatric: Patient has a normal mood and affect. behavior is normal. Judgment and thought content normal.   PHQ2/9:    04/26/2023    9:12 AM 11/23/2022   10:20 AM 05/31/2022    8:02 AM 05/02/2022    9:20 AM 11/28/2021    8:31 AM  Depression screen PHQ 2/9  Decreased Interest 0 0 0 0 0  Down, Depressed, Hopeless 0 0 0 0 0  PHQ - 2 Score 0 0 0 0 0  Altered sleeping 0 0 0 0 0  Tired, decreased energy 0 0 0 0 0  Change in appetite 0 0 0 0 0  Feeling bad or failure about yourself  0 0 0 0 0  Trouble concentrating 0 0 0 0 0  Moving slowly or fidgety/restless 0 0 0 0 0  Suicidal thoughts 0 0 0 0 0  PHQ-9 Score 0 0 0 0 0  Difficult doing work/chores Not difficult at all   Not difficult at all Not difficult at all    phq 9 is negative   Fall Risk:    04/26/2023    9:07 AM 11/23/2022   10:20 AM 05/31/2022    8:02 AM 05/02/2022    9:20 AM 11/28/2021    8:31 AM  Fall Risk    Falls in the past year? 0 0 0 1 0  Number falls in past yr: 0  0 0 0  Injury with Fall? 0  0 0 0  Risk for fall due to : No Fall Risks No Fall Risks No Fall Risks Impaired balance/gait No Fall Risks  Follow up Falls prevention discussed;Education provided;Falls evaluation completed Falls prevention discussed;Education provided;Falls evaluation completed Falls prevention discussed Falls prevention discussed;Education provided;Falls evaluation completed Falls prevention discussed;Education provided     Assessment & Plan  Assessment and Plan    Hyperlipidemia Stable on Rosuvastatin 20mg . LDL has been consistently  between 70-75 since starting medication. -Continue Rosuvastatin 20mg . -Order lipid panel.  Hypothyroidism Stable on Levothyroxine . No symptoms of hypothyroidism reported. -Continue Levothyroxine . -Order TSH.  Hypertension Stable on Losartan 25mg . No side effects reported. -Continue Losartan 25mg .  Osteopenia Slight progression noted on recent bone density scan. Patient is taking Vitamin D and maintaining physical activity. -Continue Vitamin D supplementation and physical activity. -Order Vitamin D level.  Weight Management Patient has lost significant weight through [employer], but has recently gained some weight back. -Encourage continuation of [employer] and return to pre-holiday weight.  General Health Maintenance -Order CBC, comprehensive panel. -Recommend RSV vaccine at local pharmacy. -Follow-up in 1 year for physical, or sooner if needed.

## 2023-04-16 DIAGNOSIS — H43813 Vitreous degeneration, bilateral: Secondary | ICD-10-CM | POA: Diagnosis not present

## 2023-04-16 DIAGNOSIS — H2513 Age-related nuclear cataract, bilateral: Secondary | ICD-10-CM | POA: Diagnosis not present

## 2023-04-26 ENCOUNTER — Encounter: Payer: Self-pay | Admitting: Family Medicine

## 2023-04-26 ENCOUNTER — Ambulatory Visit (INDEPENDENT_AMBULATORY_CARE_PROVIDER_SITE_OTHER): Payer: Medicare HMO | Admitting: Family Medicine

## 2023-04-26 VITALS — BP 126/78 | HR 56 | Temp 97.7°F | Resp 16 | Ht 65.5 in | Wt 164.5 lb

## 2023-04-26 DIAGNOSIS — I10 Essential (primary) hypertension: Secondary | ICD-10-CM | POA: Diagnosis not present

## 2023-04-26 DIAGNOSIS — Z23 Encounter for immunization: Secondary | ICD-10-CM

## 2023-04-26 DIAGNOSIS — G4733 Obstructive sleep apnea (adult) (pediatric): Secondary | ICD-10-CM | POA: Diagnosis not present

## 2023-04-26 DIAGNOSIS — M85852 Other specified disorders of bone density and structure, left thigh: Secondary | ICD-10-CM | POA: Diagnosis not present

## 2023-04-26 DIAGNOSIS — E039 Hypothyroidism, unspecified: Secondary | ICD-10-CM

## 2023-04-26 DIAGNOSIS — E785 Hyperlipidemia, unspecified: Secondary | ICD-10-CM | POA: Diagnosis not present

## 2023-04-27 LAB — CBC WITH DIFFERENTIAL/PLATELET
Absolute Lymphocytes: 1478 {cells}/uL (ref 850–3900)
Absolute Monocytes: 280 {cells}/uL (ref 200–950)
Basophils Absolute: 22 {cells}/uL (ref 0–200)
Basophils Relative: 0.4 %
Eosinophils Absolute: 101 {cells}/uL (ref 15–500)
Eosinophils Relative: 1.8 %
HCT: 41 % (ref 35.0–45.0)
Hemoglobin: 13.8 g/dL (ref 11.7–15.5)
MCH: 31.8 pg (ref 27.0–33.0)
MCHC: 33.7 g/dL (ref 32.0–36.0)
MCV: 94.5 fL (ref 80.0–100.0)
MPV: 10.8 fL (ref 7.5–12.5)
Monocytes Relative: 5 %
Neutro Abs: 3718 {cells}/uL (ref 1500–7800)
Neutrophils Relative %: 66.4 %
Platelets: 188 10*3/uL (ref 140–400)
RBC: 4.34 10*6/uL (ref 3.80–5.10)
RDW: 11.9 % (ref 11.0–15.0)
Total Lymphocyte: 26.4 %
WBC: 5.6 10*3/uL (ref 3.8–10.8)

## 2023-04-27 LAB — COMPLETE METABOLIC PANEL WITH GFR
AG Ratio: 1.5 (calc) (ref 1.0–2.5)
ALT: 15 U/L (ref 6–29)
AST: 21 U/L (ref 10–35)
Albumin: 4.2 g/dL (ref 3.6–5.1)
Alkaline phosphatase (APISO): 50 U/L (ref 37–153)
BUN: 19 mg/dL (ref 7–25)
CO2: 27 mmol/L (ref 20–32)
Calcium: 9.1 mg/dL (ref 8.6–10.4)
Chloride: 106 mmol/L (ref 98–110)
Creat: 0.81 mg/dL (ref 0.60–1.00)
Globulin: 2.8 g/dL (ref 1.9–3.7)
Glucose, Bld: 91 mg/dL (ref 65–99)
Potassium: 3.9 mmol/L (ref 3.5–5.3)
Sodium: 142 mmol/L (ref 135–146)
Total Bilirubin: 0.5 mg/dL (ref 0.2–1.2)
Total Protein: 7 g/dL (ref 6.1–8.1)
eGFR: 76 mL/min/{1.73_m2} (ref >=60)

## 2023-04-27 LAB — LIPID PANEL
Cholesterol: 160 mg/dL (ref <200)
HDL: 60 mg/dL (ref >=50)
LDL Cholesterol (Calc): 77 mg/dL
Non-HDL Cholesterol (Calc): 100 mg/dL (ref <130)
Total CHOL/HDL Ratio: 2.7 (calc) (ref <5.0)
Triglycerides: 125 mg/dL (ref <150)

## 2023-04-27 LAB — TSH: TSH: 0.77 m[IU]/L (ref 0.40–4.50)

## 2023-04-27 LAB — VITAMIN D 25 HYDROXY (VIT D DEFICIENCY, FRACTURES): Vit D, 25-Hydroxy: 65 ng/mL (ref 30–100)

## 2023-05-14 DIAGNOSIS — Z85828 Personal history of other malignant neoplasm of skin: Secondary | ICD-10-CM | POA: Diagnosis not present

## 2023-05-14 DIAGNOSIS — D225 Melanocytic nevi of trunk: Secondary | ICD-10-CM | POA: Diagnosis not present

## 2023-05-14 DIAGNOSIS — D2262 Melanocytic nevi of left upper limb, including shoulder: Secondary | ICD-10-CM | POA: Diagnosis not present

## 2023-05-14 DIAGNOSIS — D2261 Melanocytic nevi of right upper limb, including shoulder: Secondary | ICD-10-CM | POA: Diagnosis not present

## 2023-07-17 DIAGNOSIS — K080 Exfoliation of teeth due to systemic causes: Secondary | ICD-10-CM | POA: Diagnosis not present

## 2023-10-12 ENCOUNTER — Ambulatory Visit (INDEPENDENT_AMBULATORY_CARE_PROVIDER_SITE_OTHER)

## 2023-10-12 VITALS — BP 126/78 | Ht 65.5 in | Wt 162.0 lb

## 2023-10-12 DIAGNOSIS — Z0000 Encounter for general adult medical examination without abnormal findings: Secondary | ICD-10-CM | POA: Diagnosis not present

## 2023-10-12 DIAGNOSIS — Z2821 Immunization not carried out because of patient refusal: Secondary | ICD-10-CM

## 2023-10-12 DIAGNOSIS — Z1211 Encounter for screening for malignant neoplasm of colon: Secondary | ICD-10-CM

## 2023-10-12 NOTE — Progress Notes (Signed)
 Because this visit was a virtual/telehealth visit,  certain criteria was not obtained, such a blood pressure, CBG if applicable, and timed get up and go. Any medications not marked as taking were not mentioned during the medication reconciliation part of the visit. Any vitals not documented were not able to be obtained due to this being a telehealth visit or patient was unable to self-report a recent blood pressure reading due to a lack of equipment at home via telehealth. Vitals that have been documented are verbally provided by the patient.   This visit was performed by a medical professional under my direct supervision. I was immediately available for consultation/collaboration. I have reviewed and agree with the Annual Wellness Visit documentation.  Subjective:   Paige [name] is a 78 y.o. who presents for a Medicare Wellness preventive visit.  As a reminder, Annual Wellness Visits don't include a physical exam, and some assessments may be limited, especially if this visit is performed virtually. We may recommend an in-person follow-up visit with your provider if needed.  Visit Complete: Virtual I connected with  Paige [name] on 10/12/23 by a audio enabled telemedicine application and verified that I am speaking with the correct person using two identifiers.  Patient Location: Home  Provider Location: Home Office  I discussed the limitations of evaluation and management by telemedicine. The patient expressed understanding and agreed to proceed.  Vital Signs: Because this visit was a virtual/telehealth visit, some criteria may be missing or patient reported. Any vitals not documented were not able to be obtained and vitals that have been documented are patient reported.  VideoDeclined- This patient declined Librarian, academic. Therefore the visit was completed with audio only.  Persons Participating in Visit: Patient.  AWV Questionnaire: No:  Patient Medicare AWV questionnaire was not completed prior to this visit.  Cardiac Risk Factors include: advanced age (>18men, >23 women);hypertension;dyslipidemia     Objective:    Today's Vitals   10/12/23 0813  BP: 126/78  Weight: 162 lb (73.5 kg)  Height: 5' 5.5 (1.664 m)   Body mass index is 26.55 kg/m.     10/12/2023    8:12 AM 04/26/2021    2:32 PM 03/04/2020    1:47 PM 03/04/2019    2:01 PM 01/22/2017   11:14 AM 01/15/2017   11:46 AM 11/20/2016    2:39 PM  Advanced Directives  Does Patient Have a Medical Advance Directive? No Yes Yes No No  No  No   Type of Special educational needs teacher of [place];Living will Healthcare Power of [place];Living will      Copy of Healthcare Power of Attorney in Chart?  No - copy requested No - copy requested      Would patient like information on creating a medical advance directive? No - Patient declined   Yes (MAU/Ambulatory/Procedural Areas - Information given)   Yes (ED - Information included in AVS)      Data saved with a previous flowsheet row definition    Current Medications (verified) Outpatient Encounter Medications as of 10/12/2023  Medication Sig   aspirin 81 MG tablet Take 1 tablet by mouth daily.   Calcium -Phosphorus-Vitamin D  100-50-100 MG-MG-UNIT CHEW Chew 1 tablet by mouth.   Coenzyme Q10 100 MG TABS Take 100 mg by mouth daily.   cyanocobalamin 100 MCG tablet Take 1,000 mcg by mouth daily.    glucosamine-chondroitin 500-400 MG tablet Take 1 tablet by mouth 3 (three) times daily.   levothyroxine  (SYNTHROID ) 88  MCG tablet TAKE 1 TABLET EVERY DAY BEFORE BREAKFAST   losartan  (COZAAR ) 25 MG tablet TAKE 1 TABLET EVERY DAY   MULTIPLE VITAMIN PO Take by mouth daily.   Multiple Vitamins-Minerals (HAIR SKIN AND NAILS FORMULA PO) Take 1 tablet by mouth daily.   Omega-3 Fatty Acids (FISH OIL BURP-LESS) 1200 MG CAPS Take 1 capsule by mouth.   rosuvastatin  (CRESTOR ) 20 MG tablet TAKE 1 TABLET EVERY DAY   No  facility-administered encounter medications on file as of 10/12/2023.    Allergies (verified) Codeine   History: Past Medical History:  Diagnosis Date   Hyperlipidemia    Hypertension    Thyroid  disease    History reviewed. No pertinent surgical history. Family History  Problem Relation Age of Onset   Cancer Mother    Diabetes Father    Stroke Father    Breast cancer Neg Hx    Social History   Socioeconomic History   Marital status: Married    Spouse name: Not on file   Number of children: 0   Years of education: Not on file   Highest education level: High school graduate  Occupational History   Occupation: Retired   Occupation: Psychologist, sport and exercise  Tobacco Use   Smoking status: Former    Current packs/day: 0.00    Average packs/day: 0.3 packs/day for 5.0 years (1.3 ttl pk-yrs)    Types: Cigarettes    Start date: 05/01/1972    Quit date: 05/01/1977    Years since quitting: 46.4   Smokeless tobacco: Never  Vaping Use   Vaping status: Never Used  Substance and Sexual Activity   Alcohol use: No   Drug use: No   Sexual activity: Yes    Partners: Male  Other Topics Concern   Not on file  Social History Narrative   Second marriage, widow from first marriage at age 8 and her current husband had two teenagers who she help raise    Social Drivers of Health   Financial Resource Strain: Low Risk  (10/12/2023)   Overall Financial Resource Strain (CARDIA)    Difficulty of Paying Living Expenses: Not hard at all  Food Insecurity: No Food Insecurity (10/12/2023)   Hunger Vital Sign    Worried About Running Out of Food in the Last Year: Never true    Ran Out of Food in the Last Year: Never true  Transportation Needs: No Transportation Needs (10/12/2023)   PRAPARE - Administrator, Civil Service (Medical): No    Lack of Transportation (Non-Medical): No  Physical Activity: Sufficiently Active (10/12/2023)   Exercise Vital Sign    Days of Exercise per Week: 7 days     Minutes of Exercise per Session: 30 min  Stress: No Stress Concern Present (10/12/2023)   [employer] of Occupational Health - Occupational Stress Questionnaire    Feeling of Stress: Not at all  Social Connections: Socially Integrated (10/12/2023)   Social Connection and Isolation Panel    Frequency of Communication with Friends and Family: More than three times a week    Frequency of Social Gatherings with Friends and Family: More than three times a week    Attends Religious Services: More than 4 times per year    Active Member of [employer] or Organizations: Yes    Attends Engineer, structural: More than 4 times per year    Marital Status: Married    Tobacco Counseling Counseling given: Not Answered    Clinical Intake:  Pre-visit preparation completed:  Yes  Pain : No/denies pain     BMI - recorded: 26.55 Nutritional Status: BMI 25 -29 Overweight Nutritional Risks: None Diabetes: No  Lab Results  Component Value Date   HGBA1C 5.1 08/06/2019     How often do you need to have someone help you when you read instructions, pamphlets, or other written materials from your doctor or pharmacy?: 1 - Never What is the last grade level you completed in school?: some college  Interpreter Needed?: No  Information entered by :: [employer]   Activities of Daily Living     10/12/2023    8:16 AM 11/23/2022   10:20 AM  In your present state of health, do you have any difficulty performing the following activities:  Hearing? 0 0  Vision? 0 0  Difficulty concentrating or making decisions? 0 0  Walking or climbing stairs? 0 0  Dressing or bathing? 0 0  Doing errands, shopping? 0 0  Preparing Food and eating ? N   Using the Toilet? N   In the past six months, have you accidently leaked urine? Y   Do you have problems with loss of bowel control? N   Managing your Medications? N   Managing your Finances? N   Housekeeping or managing your Housekeeping? N      Patient Care Team: Sowles, Krichna, MD as PCP - General (Family Medicine) Clair Crews, MD as Referring Physician (Ophthalmology) Rogers Clayman, MD as Referring Physician (Otolaryngology)  I have updated your Care Teams any recent Medical Services you may have received from other providers in the past year.     Assessment:   This is a routine wellness examination for Paige [name].  Hearing/Vision screen Hearing Screening - Comments:: Patient wears hearing aids  Vision Screening - Comments:: Patient wears glasses    Goals Addressed             This Visit's Progress    Increase water intake   On track    Recommend increasing water intake to 6-8 glasses a day.        Depression Screen     10/12/2023    8:18 AM 04/26/2023    9:12 AM 11/23/2022   10:20 AM 05/31/2022    8:02 AM 05/02/2022    9:20 AM 11/28/2021    8:31 AM 05/31/2021    9:19 AM  PHQ 2/9 Scores  PHQ - 2 Score 0 0 0 0 0 0 0  PHQ- 9 Score 1 0 0 0 0 0 0    Fall Risk     10/12/2023    8:15 AM 04/26/2023    9:07 AM 11/23/2022   10:20 AM 05/31/2022    8:02 AM 05/02/2022    9:20 AM  Fall Risk   Falls in the past year? 0 0 0 0 1  Number falls in past yr: 0 0  0 0  Injury with Fall? 0 0  0 0  Risk for fall due to : No Fall Risks No Fall Risks No Fall Risks No Fall Risks Impaired balance/gait  Follow up Falls evaluation completed Falls prevention discussed;Education provided;Falls evaluation completed Falls prevention discussed;Education provided;Falls evaluation completed Falls prevention discussed Falls prevention discussed;Education provided;Falls evaluation completed      Data saved with a previous flowsheet row definition    MEDICARE RISK AT HOME:  Medicare Risk at Home Any stairs in or around the home?: Yes If so, are there any without handrails?: No Home free of loose throw rugs in  walkways, pet beds, electrical cords, etc?: Yes Adequate lighting in your home to reduce risk of falls?: Yes Life  alert?: No Use of a cane, walker or w/c?: No Grab bars in the bathroom?: Yes Shower chair or bench in shower?: No Elevated toilet seat or a handicapped toilet?: Yes  TIMED UP AND GO:  Was the test performed?  No  Cognitive Function: 6CIT completed        10/12/2023    8:15 AM 05/02/2022    9:46 AM 03/04/2020    1:50 PM 02/26/2018   11:35 AM 11/20/2016    2:43 PM  6CIT Screen  What Year? 0 points 0 points 0 points 0 points 0 points  What month? 0 points 0 points 0 points 0 points 0 points  What time? 0 points 0 points 0 points 0 points 0 points  Count back from 20 0 points 0 points 0 points 0 points 0 points  Months in reverse 0 points 0 points 0 points 0 points 0 points  Repeat phrase 0 points 0 points 0 points 0 points 0 points  Total Score 0 points 0 points 0 points 0 points 0 points    Immunizations Immunization History  Administered Date(s) Administered   Fluad Quad(high Dose 65+) 02/11/2019, 01/23/2020   Influenza, High Dose Seasonal PF 03/16/2015, 02/26/2018, 02/05/2023   Influenza-Unspecified 03/16/2017, 02/17/2021, 04/02/2022   PFIZER(Purple Top)SARS-COV-2 Vaccination 06/21/2019, 07/15/2019, 03/31/2020   Pfizer(Comirnaty)Fall Seasonal Vaccine 12 years and older 01/29/2022   Pneumococcal Conjugate-13 11/20/2016   Pneumococcal Polysaccharide-23 05/01/2012, 02/26/2018    Screening Tests Health Maintenance  Topic Date Due   Zoster Vaccines- Shingrix  (1 of 2) Never done   COVID-19 Vaccine (5 - 2024-25 season) 12/31/2022   Fecal DNA (Cologuard)  09/30/2023   DTaP/Tdap/Td (1 - Tdap) 04/25/2024 (Originally 11/06/1964)   INFLUENZA VACCINE  11/30/2023   MAMMOGRAM  12/04/2023   Medicare Annual Wellness (AWV)  10/11/2024   DEXA SCAN  12/03/2024   Pneumococcal Vaccine: 50+ Years  Completed   Hepatitis C Screening  Completed   HPV VACCINES  Aged Out   Meningococcal B Vaccine  Aged Out    Health Maintenance  Health Maintenance Due  Topic Date Due   Zoster Vaccines-  Shingrix  (1 of 2) Never done   COVID-19 Vaccine (5 - 2024-25 season) 12/31/2022   Fecal DNA (Cologuard)  09/30/2023   Health Maintenance Items Addressed:   Additional Screening:  Vision Screening: Recommended annual ophthalmology exams for early detection of glaucoma and other disorders of the eye. Would you like a referral to an eye doctor? No    Dental Screening: Recommended annual dental exams for proper oral hygiene  Community Resource Referral / Chronic Care Management: CRR required this visit?  Yes   CCM required this visit?  No   Plan:    I have personally reviewed and noted the following in the patient's chart:   Medical and social history Use of alcohol, tobacco or illicit drugs  Current medications and supplements including opioid prescriptions. Patient is not currently taking opioid prescriptions. Functional ability and status Nutritional status Physical activity Advanced directives List of other physicians Hospitalizations, surgeries, and ER visits in previous 12 months Vitals Screenings to include cognitive, depression, and falls Referrals and appointments  In addition, I have reviewed and discussed with patient certain preventive protocols, quality metrics, and best practice recommendations. A written personalized care plan for preventive services as well as general preventive health recommendations were provided to patient.   Ebany Bowermaster K  Aviva Lemmings, [facility]   10/12/2023   After Visit Summary: (MyChart) Due to this being a telephonic visit, the after visit summary with patients personalized plan was offered to patient via MyChart   Notes: Nothing significant to report at this time.

## 2023-10-12 NOTE — Patient Instructions (Signed)
 Paige [name] , Thank you for taking time out of your busy schedule to complete your Annual Wellness Visit with me. I enjoyed our conversation and look forward to speaking with you again next year. I, as well as your care team,  appreciate your ongoing commitment to your health goals. Please review the following plan we discussed and let me know if I can assist you in the future. Your Game plan/ To Do List    Referrals: If you haven't heard from the office you've been referred to, please reach out to them at the phone provided.  None Follow up Visits: Next Medicare AWV with our clinical staff: 10/17/2024   Have you seen your provider in the last 6 months (3 months if uncontrolled diabetes)? No Next Office Visit with your provider: 11/26/2023  Clinician Recommendations:  Aim for 30 minutes of exercise or brisk walking, 6-8 glasses of water, and 5 servings of fruits and vegetables each day.       This is a list of the screening recommended for you and due dates:  Health Maintenance  Topic Date Due   Zoster (Shingles) Vaccine (1 of 2) Never done   COVID-19 Vaccine (5 - 2024-25 season) 12/31/2022   Cologuard (Stool DNA test)  09/30/2023   DTaP/Tdap/Td vaccine (1 - Tdap) 04/25/2024*   Flu Shot  11/30/2023   Mammogram  12/04/2023   Medicare Annual Wellness Visit  10/11/2024   DEXA scan (bone density measurement)  12/03/2024   Pneumococcal Vaccine for age over 10  Completed   Hepatitis C Screening  Completed   HPV Vaccine  Aged Out   Meningitis B Vaccine  Aged Out  *Topic was postponed. The date shown is not the original due date.    Advanced directives: (Declined) Advance directive discussed with you today. Even though you declined this today, please call our office should you change your mind, and we can give you the proper paperwork for you to fill out. Advance Care Planning is important because it:  [x]  Makes sure you receive the medical care that is consistent with your values,  goals, and preferences  [x]  It provides guidance to your family and loved ones and reduces their decisional burden about whether or not they are making the right decisions based on your wishes.  Follow the link provided in your after visit summary or read over the paperwork we have mailed to you to help you started getting your Advance Directives in place. If you need assistance in completing these, please reach out to us  so that we can help you!  See attachments for Preventive Care and Fall Prevention Tips.

## 2023-10-29 DIAGNOSIS — K080 Exfoliation of teeth due to systemic causes: Secondary | ICD-10-CM | POA: Diagnosis not present

## 2023-10-30 DIAGNOSIS — Z1211 Encounter for screening for malignant neoplasm of colon: Secondary | ICD-10-CM | POA: Diagnosis not present

## 2023-11-05 ENCOUNTER — Ambulatory Visit: Payer: Self-pay | Admitting: Family Medicine

## 2023-11-05 LAB — COLOGUARD: COLOGUARD: NEGATIVE

## 2023-11-26 ENCOUNTER — Ambulatory Visit (INDEPENDENT_AMBULATORY_CARE_PROVIDER_SITE_OTHER): Payer: Self-pay | Admitting: Family Medicine

## 2023-11-26 ENCOUNTER — Encounter: Payer: Self-pay | Admitting: Family Medicine

## 2023-11-26 VITALS — BP 130/70 | HR 63 | Resp 16 | Ht 65.0 in | Wt 166.0 lb

## 2023-11-26 DIAGNOSIS — E039 Hypothyroidism, unspecified: Secondary | ICD-10-CM

## 2023-11-26 DIAGNOSIS — E785 Hyperlipidemia, unspecified: Secondary | ICD-10-CM

## 2023-11-26 DIAGNOSIS — G2581 Restless legs syndrome: Secondary | ICD-10-CM | POA: Insufficient documentation

## 2023-11-26 DIAGNOSIS — M85852 Other specified disorders of bone density and structure, left thigh: Secondary | ICD-10-CM | POA: Diagnosis not present

## 2023-11-26 DIAGNOSIS — M65311 Trigger thumb, right thumb: Secondary | ICD-10-CM | POA: Insufficient documentation

## 2023-11-26 DIAGNOSIS — Z0000 Encounter for general adult medical examination without abnormal findings: Secondary | ICD-10-CM | POA: Diagnosis not present

## 2023-11-26 DIAGNOSIS — N3941 Urge incontinence: Secondary | ICD-10-CM

## 2023-11-26 DIAGNOSIS — J3089 Other allergic rhinitis: Secondary | ICD-10-CM

## 2023-11-26 DIAGNOSIS — I10 Essential (primary) hypertension: Secondary | ICD-10-CM | POA: Diagnosis not present

## 2023-11-26 MED ORDER — AZELASTINE HCL 0.1 % NA SOLN: 2.0000 | Freq: Two times a day (BID) | NASAL | 2 refills | Status: AC

## 2023-11-26 MED ORDER — LEVOTHYROXINE SODIUM 88 MCG PO TABS: 88.0000 ug | ORAL_TABLET | Freq: Every day | ORAL | 1 refills | Status: DC

## 2023-11-26 MED ORDER — SOLIFENACIN SUCCINATE 10 MG PO TABS: 10.0000 mg | ORAL_TABLET | Freq: Every day | ORAL | 1 refills | Status: DC

## 2023-11-26 MED ORDER — ROSUVASTATIN CALCIUM 20 MG PO TABS: 20.0000 mg | ORAL_TABLET | Freq: Every day | ORAL | 1 refills | Status: DC

## 2023-11-26 MED ORDER — LOSARTAN POTASSIUM 25 MG PO TABS: 25.0000 mg | ORAL_TABLET | Freq: Every day | ORAL | 1 refills | Status: DC

## 2023-11-26 NOTE — Progress Notes (Signed)
 Name: Paige [name]   MRN: [id number]    DOB: Dec 31, 1945   Date:11/26/2023       Progress Note  Subjective  Chief Complaint  Chief Complaint  Patient presents with   Annual Exam    HPI  Patient presents for annual CPE and follow up   Discussed the use of AI scribe software for clinical note transcription with the patient, who gave verbal consent to proceed.  History of Present Illness Paige [name] is a 78 year old female with hypertension and bladder issues who presents for well exam and a follow-up visit.  She experiences bladder urgency and leakage, particularly overnight. If she does not reach a bathroom quickly, she experiences leakage. She has seen a bladder specialist who suggested medication for urgency, but she has not yet started it.  She also experiences chronic constipation, which she manages with Miralax to keep her stools soft.  She has a history of hypertension and is currently taking losartan  25 mg. She mentions needing a prescription due to new insurance. No chest pain or palpitations.  Dyslipidemia, taking statin therapy and denies side effects  She experiences occasional dizziness, which she attributes to sinus issues, and reports a recent episode upon waking from a nap. She also reports dry mouth, which she associates with taking allergy medication at night due to drainage.  She mentions experiencing lower back pain, which she attributes to possible joint issues. She has started taking glucosamine, which has helped with her back pain but not with her trigger finger. Her middle finger sometimes gets sore and stiff, but it does not trigger all the time. She has not seen an orthopedic specialist for this issue.  She has a history of osteopenia. She maintains a high calcium  diet and takes vitamin D  supplements.  Her diet includes eating out frequently with seniors, and she notes a recent weight gain. She is trying to be mindful of her eating  habits. She remains active and busy with various activities and responsibilities.    Diet: eating out more often with church group  Exercise: continue regular physical activity   Last Eye Exam: completed Last Dental Exam: completed  Flowsheet Row Clinical Support from 10/12/2023 in [facility]  AUDIT-C Score 0   Depression: Phq 9 is  negative    11/26/2023    8:45 AM 10/12/2023    8:18 AM 04/26/2023    9:12 AM 11/23/2022   10:20 AM 05/31/2022    8:02 AM  Depression screen PHQ 2/9  Decreased Interest 0 0 0 0 0  Down, Depressed, Hopeless 0 0 0 0 0  PHQ - 2 Score 0 0 0 0 0  Altered sleeping  0 0 0 0  Tired, decreased energy  0 0 0 0  Change in appetite  1 0 0 0  Feeling bad or failure about yourself   0 0 0 0  Trouble concentrating  0 0 0 0  Moving slowly or fidgety/restless  0 0 0 0  Suicidal thoughts  0 0 0 0  PHQ-9 Score  1 0 0 0  Difficult doing work/chores  Not difficult at all Not difficult at all     Hypertension: BP Readings from Last 3 Encounters:  11/26/23 130/70  10/12/23 126/78  04/26/23 126/78   Obesity: Wt Readings from Last 3 Encounters:  11/26/23 166 lb (75.3 kg)  10/12/23 162 lb (73.5 kg)  04/26/23 164 lb 8 oz (74.6 kg)   BMI  Readings from Last 3 Encounters:  11/26/23 27.62 kg/m  10/12/23 26.55 kg/m  04/26/23 26.96 kg/m     Vaccines: reviewed with the patient.   Hep C Screening: completed STD testing and prevention (HIV/chl/gon/syphilis): N/A Intimate partner violence: negative screen  Sexual History : not in over one year  Menstrual History/LMP/Abnormal Bleeding: post menopausal  Discussed importance of follow up if any post-menopausal bleeding: yes  Incontinence Symptoms: positive for symptoms   Breast cancer:  - Last Mammogram: up to date - BRCA gene screening: N/A  Osteoporosis Prevention : Discussed high calcium  and vitamin D  supplementation, weight bearing exercises Bone density :yes   Cervical cancer  screening: not applicable due to age  Skin cancer: Discussed monitoring for atypical lesions  Colorectal cancer: up to date    Lung cancer:  Low Dose CT Chest recommended if Age 57-80 years, 20 pack-year currently smoking OR have quit w/in 15years. Patient does not qualify for screen   ECG: recheck next visit   Advanced Care Planning: A voluntary discussion about advance care planning including the explanation and discussion of advance directives.  Discussed health care proxy and Living will, and the patient was able to identify a health care proxy as husband .  Patient does not know have a living will and power of attorney of health care   Patient Active Problem List   Diagnosis Date Noted   OSA (obstructive sleep apnea) 02/05/2020   Essential hypertension 08/28/2018   Osteopenia 08/28/2018   Hyperlipidemia 10/27/2014   Allergic rhinitis 10/27/2014   Adult hypothyroidism 10/13/2014    History reviewed. No pertinent surgical history.  Family History  Problem Relation Age of Onset   Cancer Mother    Diabetes Father    Stroke Father    Breast cancer Neg Hx     Social History   Socioeconomic History   Marital status: Married    Spouse name: Not on file   Number of children: 0   Years of education: Not on file   Highest education level: High school graduate  Occupational History   Occupation: Retired   Occupation: Psychologist, sport and exercise  Tobacco Use   Smoking status: Former    Current packs/day: 0.00    Average packs/day: 0.3 packs/day for 5.0 years (1.3 ttl pk-yrs)    Types: Cigarettes    Start date: 05/01/1972    Quit date: 05/01/1977    Years since quitting: 46.6   Smokeless tobacco: Never  Vaping Use   Vaping status: Never Used  Substance and Sexual Activity   Alcohol use: No   Drug use: No   Sexual activity: Yes    Partners: Male  Other Topics Concern   Not on file  Social History Narrative   Second marriage, widow from first marriage at age 89 and her current husband  had two teenagers who she help raise    Social Drivers of Health   Financial Resource Strain: Low Risk  (10/12/2023)   Overall Financial Resource Strain (CARDIA)    Difficulty of Paying Living Expenses: Not hard at all  Food Insecurity: No Food Insecurity (10/12/2023)   Hunger Vital Sign    Worried About Running Out of Food in the Last Year: Never true    Ran Out of Food in the Last Year: Never true  Transportation Needs: No Transportation Needs (10/12/2023)   PRAPARE - Administrator, Civil Service (Medical): No    Lack of Transportation (Non-Medical): No  Physical Activity: Sufficiently Active (10/12/2023)  Exercise Vital Sign    Days of Exercise per Week: 7 days    Minutes of Exercise per Session: 30 min  Stress: No Stress Concern Present (10/12/2023)   [employer] of Occupational Health - Occupational Stress Questionnaire    Feeling of Stress: Not at all  Social Connections: Socially Integrated (10/12/2023)   Social Connection and Isolation Panel    Frequency of Communication with Friends and Family: More than three times a week    Frequency of Social Gatherings with Friends and Family: More than three times a week    Attends Religious Services: More than 4 times per year    Active Member of [employer] or Organizations: Yes    Attends Engineer, structural: More than 4 times per year    Marital Status: Married  Catering manager Violence: Not At Risk (10/12/2023)   Humiliation, Afraid, Rape, and Kick questionnaire    Fear of Current or Ex-Partner: No    Emotionally Abused: No    Physically Abused: No    Sexually Abused: No     Current Outpatient Medications:    aspirin 81 MG tablet, Take 1 tablet by mouth daily., Disp: , Rfl:    Biotin 1 MG CAPS, Take 1 tablet by mouth daily., Disp: , Rfl:    Calcium -Phosphorus-Vitamin D  100-50-100 MG-MG-UNIT CHEW, Chew 1 tablet by mouth., Disp: , Rfl:    Coenzyme Q10 100 MG TABS, Take 100 mg by mouth daily., Disp: , Rfl:     Cranberry 125 MG TABS, Take 1 tablet by mouth daily., Disp: , Rfl:    cyanocobalamin 100 MCG tablet, Take 1,000 mcg by mouth daily. , Disp: , Rfl:    glucosamine-chondroitin 500-400 MG tablet, Take 1 tablet by mouth 3 (three) times daily., Disp: , Rfl:    levothyroxine  (SYNTHROID ) 88 MCG tablet, TAKE 1 TABLET EVERY DAY BEFORE BREAKFAST, Disp: 90 tablet, Rfl: 3   losartan  (COZAAR ) 25 MG tablet, TAKE 1 TABLET EVERY DAY, Disp: 90 tablet, Rfl: 3   MULTIPLE VITAMIN PO, Take by mouth daily., Disp: , Rfl:    Multiple Vitamins-Minerals (HAIR SKIN AND NAILS FORMULA PO), Take 1 tablet by mouth daily., Disp: , Rfl:    Omega-3 Fatty Acids (FISH OIL BURP-LESS) 1200 MG CAPS, Take 1 capsule by mouth., Disp: , Rfl:    rosuvastatin  (CRESTOR ) 20 MG tablet, TAKE 1 TABLET EVERY DAY, Disp: 90 tablet, Rfl: 3  Allergies  Allergen Reactions   Codeine Nausea And Vomiting     ROS  Constitutional: Negative for fever , positive for weight change.  Respiratory: Negative for cough and shortness of breath.   Cardiovascular: Negative for chest pain or palpitations.  Gastrointestinal: Negative for abdominal pain, no bowel changes.  Musculoskeletal: Negative for gait problem or joint swelling.  Skin: Negative for rash.  Neurological: Negative for dizziness or headache.  No other specific complaints in a complete review of systems (except as listed in HPI above).   Objective  Vitals:   11/26/23 0848  BP: 130/70  Pulse: 63  Resp: 16  SpO2: 97%  Weight: 166 lb (75.3 kg)  Height: 5' 5 (1.651 m)    Body mass index is 27.62 kg/m.  Physical Exam  Constitutional: Patient appears well-developed and well-nourished. No distress.  HENT: Head: Normocephalic and atraumatic. Ears: B TMs ok, no erythema or effusion; Nose: Nose normal. Mouth/Throat: Oropharynx is clear and moist. No oropharyngeal exudate.  Eyes: Conjunctivae and EOM are normal. Pupils are equal, round, and reactive to light. No  scleral icterus.   Neck: Normal range of motion. Neck supple. No JVD present. No thyromegaly present.  Cardiovascular: Normal rate, regular rhythm and normal heart sounds.  No murmur heard. No BLE edema. Pulmonary/Chest: Effort normal and breath sounds normal. No respiratory distress. Abdominal: Soft. Bowel sounds are normal, no distension. There is no tenderness. no masses Breast: no lumps or masses, no nipple discharge or rashes FEMALE GENITALIA:  Not done  RECTAL: not done  Musculoskeletal: Normal range of motion, no joint effusions. No gross deformities Neurological: he is alert and oriented to person, place, and time. No cranial nerve deficit. Coordination, balance, strength, speech and gait are normal.  Skin: Skin is warm and dry. No rash noted. No erythema.  Psychiatric: Patient has a normal mood and affect. behavior is normal. Judgment and thought content normal.     Assessment & Plan   Assessment & Plan  Well adult Continue regular physical activity and healthy diet Return for labs in 6 months  Urge urinary incontinence Urge urinary incontinence with urgency and leakage, especially overnight. - Prescribe VESIcare  10 mg. - Consider pelvic floor exercises. - Monitor for side effects such as dry mouth.  Chronic constipation Chronic constipation managed with Miralax to ensure soft stools and prevent bladder pressure. - Continue Miralax as needed for soft stools.  Hypertension Hypertension managed with losartan  25 mg. EKG is due as the last one was in 2019. - Prescribe losartan  25 mg. - Schedule EKG during next follow-up visit.  Hypothyroidism Hypothyroidism managed with levothyroxine . Increased dose to one pill daily a month ago. Plan to check TSH in two weeks to assess current dosing. - Prescribe levothyroxine  88 mcg. - Check TSH in two weeks.  Hyperlipidemia Hyperlipidemia managed with rosuvastatin  20 mg. Last LDL was within target range. - Prescribe rosuvastatin  20  mg.  Osteoarthritis Osteoarthritis with occasional lower back pain, possibly related to activity. Managed with glucosamine and topical treatments. - Continue glucosamine as tolerated. - Use Voltaren gel for symptomatic relief.  Trigger finger, right middle finger Trigger finger in the right middle finger with occasional soreness and stiffness. Discussed non-surgical management options including topical treatments and splinting. - Recommend Voltaren gel for topical application. - Suggest using a finger splint at night. - Consider referral to orthopedics if symptoms worsen.  Osteopenia of femur Osteopenia of the femur with no current need for pharmacological treatment. Emphasis on high calcium  diet and vitamin D  supplementation. - Encourage high calcium  diet and vitamin D  supplementation.  Allergic rhinitis Allergic rhinitis with previous oral antihistamine use, but switching to Astelin  nasal spray due to side effects. - Prescribe Astelin  nasal spray. - Continue oral antihistamines as needed.  Hearing loss Hearing loss managed with hearing aids.      -USPSTF grade A and B recommendations reviewed with patient; age-appropriate recommendations, preventive care, screening tests, etc discussed and encouraged; healthy living encouraged; see AVS for patient education given to patient -Discussed importance of 150 minutes of physical activity weekly, eat two servings of fish weekly, eat one serving of tree nuts ( cashews, pistachios, pecans, almonds.SABRA) every other day, eat 6 servings of fruit/vegetables daily and drink plenty of water and avoid sweet beverages.   -Reviewed Health Maintenance: Yes.

## 2023-12-24 DIAGNOSIS — G8929 Other chronic pain: Secondary | ICD-10-CM | POA: Diagnosis not present

## 2023-12-26 ENCOUNTER — Ambulatory Visit: Payer: Self-pay

## 2023-12-26 NOTE — Telephone Encounter (Signed)
 FYI Only or Action Required?: Action required by provider: requesting medication for vertigo to help until her appointment tomorrow.  Patient was last seen in primary care on 11/26/2023 by Glenard Mire, MD.  Called Nurse Triage reporting Dizziness.  Symptoms began yesterday.  Interventions attempted: Rest, hydration, or home remedies.  Symptoms are: improved from yesterday.  Triage Disposition: See Physician Within 24 Hours  Patient/caregiver understands and will follow disposition?: Yes         Copied from CRM #[id number]. Topic: Clinical - Red Word Triage >> Dec 26, 2023 11:48 AM Avram MATSU wrote: Red Word that prompted transfer to Nurse Triage: dizziness and nauseous         Reason for Disposition  [1] MODERATE dizziness (e.g., vertigo; feels very unsteady, interferes with normal activities) AND [2] has NOT been evaluated by doctor (or NP/PA) for this  Answer Assessment - Initial Assessment Questions Appointment scheduled for 8/28, patient would like something to help with the dizziness until then if possible. Please advise. Patient states she can be reached by phone, [phone]      1. DESCRIPTION: Describe your dizziness.     Dizzy like vertigo  2. VERTIGO: Do you feel like either you or the room is spinning or tilting?      Yes 3. LIGHTHEADED: Do you feel lightheaded? (e.g., somewhat faint, woozy, weak upon standing)     No 4. SEVERITY: How bad is it?  Can you walk?     Able to walk with assistance  5. ONSET:  When did the dizziness begin?     Last night  6. AGGRAVATING FACTORS: Does anything make it worse? (e.g., standing, change in head position)     Standing and walking exacerbates dizziness  7. CAUSE: What do you think is causing the dizziness?     Unsure  8. RECURRENT SYMPTOM: Have you had dizziness before? If Yes, ask: When was the last time? What happened that time?     Yes, was due to sinuses  9. OTHER SYMPTOMS: Do you  have any other symptoms? (e.g., earache, headache, numbness, tinnitus, vomiting, weakness)     Nausea  Protocols used: Dizziness - Vertigo-A-AH

## 2023-12-27 ENCOUNTER — Ambulatory Visit (INDEPENDENT_AMBULATORY_CARE_PROVIDER_SITE_OTHER): Admitting: Internal Medicine

## 2023-12-27 ENCOUNTER — Encounter: Payer: Self-pay | Admitting: Internal Medicine

## 2023-12-27 ENCOUNTER — Other Ambulatory Visit: Payer: Self-pay

## 2023-12-27 VITALS — BP 132/84 | HR 70 | Temp 98.2°F | Resp 16 | Ht 65.5 in | Wt 164.9 lb

## 2023-12-27 DIAGNOSIS — R42 Dizziness and giddiness: Secondary | ICD-10-CM | POA: Diagnosis not present

## 2023-12-27 LAB — POC COVID19/FLU A&B COMBO
Covid Antigen, POC: NEGATIVE
Influenza A Antigen, POC: NEGATIVE
Influenza B Antigen, POC: NEGATIVE

## 2023-12-27 MED ORDER — MECLIZINE HCL 12.5 MG PO TABS: 12.5000 mg | ORAL_TABLET | Freq: Two times a day (BID) | ORAL | 0 refills | Status: AC | PRN

## 2023-12-27 NOTE — Patient Instructions (Signed)
 How to Perform the Epley Maneuver The Epley maneuver is an exercise that relieves symptoms of vertigo. Vertigo is the feeling that you or your surroundings are moving when they are not. When you feel vertigo, you may feel like the room is spinning and may have trouble walking. The Epley maneuver is used for a type of vertigo caused by a calcium deposit in a part of the inner ear. The maneuver involves changing head positions to help the deposit move out of the area. You can do this maneuver at home whenever you have symptoms of vertigo. You can repeat it in 24 hours if your vertigo has not gone away. Even though the Epley maneuver may relieve your vertigo for a few weeks, it is possible that your symptoms will return. This maneuver relieves vertigo, but it does not relieve dizziness. What are the risks? If it is done correctly, the Epley maneuver is considered safe. Sometimes it can lead to dizziness or nausea that goes away after a short time. If you develop other symptoms--such as changes in vision, weakness, or numbness--stop doing the maneuver and call your health care provider. Supplies needed: A bed or table. A pillow. How to do the Epley maneuver     Sit on the edge of a bed or table with your back straight and your legs extended or hanging over the edge of the bed or table. Turn your head halfway toward the affected ear or side as told by your health care provider. Lie backward quickly with your head turned until you are lying flat on your back. Your head should dangle (head-hanging position). You may want to position a pillow under your shoulders. Hold this position for at least 30 seconds. If you feel dizzy or have symptoms of vertigo, continue to hold the position until the symptoms stop. Turn your head to the opposite direction until your unaffected ear is facing down. Your head should continue to dangle. Hold this position for at least 30 seconds. If you feel dizzy or have symptoms of  vertigo, continue to hold the position until the symptoms stop. Turn your whole body to the same side as your head so that you are positioned on your side. Your head will now be nearly facedown and no longer needs to dangle. Hold for at least 30 seconds. If you feel dizzy or have symptoms of vertigo, continue to hold the position until the symptoms stop. Sit back up. You can repeat the maneuver in 24 hours if your vertigo does not go away. Follow these instructions at home: For 24 hours after doing the Epley maneuver: Keep your head in an upright position. When lying down to sleep or rest, keep your head raised (elevated) with two or more pillows. Avoid excessive neck movements. Activity Do not drive or use machinery if you feel dizzy. After doing the Epley maneuver, return to your normal activities as told by your health care provider. Ask your health care provider what activities are safe for you. General instructions Drink enough fluid to keep your urine pale yellow. Do not drink alcohol. Take over-the-counter and prescription medicines only as told by your health care provider. Keep all follow-up visits. This is important. Preventing vertigo symptoms Ask your health care provider if there is anything you should do at home to prevent vertigo. He or she may recommend that you: Keep your head elevated with two or more pillows while you sleep. Do not sleep on the side of your affected ear. Get  up slowly from bed. Avoid sudden movements during the day. Avoid extreme head positions or movement, such as looking up or bending over. Contact a health care provider if: Your vertigo gets worse. You have other symptoms, including: Nausea. Vomiting. Headache. Get help right away if you: Have vision changes. Have a headache or neck pain that is severe or getting worse. Cannot stop vomiting. Have new numbness or weakness in any part of your body. These symptoms may represent a serious problem  that is an emergency. Do not wait to see if the symptoms will go away. Get medical help right away. Call your local emergency services (911 in the [place] Do not drive yourself to the hospital. Summary Vertigo is the feeling that you or your surroundings are moving when they are not. The Epley maneuver is an exercise that relieves symptoms of vertigo. If the Epley maneuver is done correctly, it is considered safe. This information is not intended to replace advice given to you by your health care provider. Make sure you discuss any questions you have with your health care provider. Document Revised: 01/12/2023 Document Reviewed: 01/12/2023 Elsevier Patient Education  2024 [employer].

## 2023-12-27 NOTE — Progress Notes (Signed)
 Acute Office Visit  Subjective:     Patient ID: Paige [name], female    DOB: 07-22-1945, 78 y.o.   MRN: [id number]  Chief Complaint  Patient presents with   Dizziness    Nausea for 2 days    HPI Patient is in today for dizziness and nausea. She is a patient of Dr. Glenard and this is my first time meeting her. She is here with her husband.  Discussed the use of AI scribe software for clinical note transcription with the patient, who gave verbal consent to proceed.  History of Present Illness Paige [name] is a 78 year old female who presents with dizziness and nausea.  Dizziness and nausea began early yesterday morning. The dizziness is a sensation of the room spinning, especially when getting up or moving her head, and is accompanied by nausea and vomiting. She vomited twice, at 1 AM and 3 AM. Dizziness improves when sitting up but worsens when turning on her side or moving her head. She has experienced similar vertigo episodes, particularly with weather changes, and uses a walker for stability.  Nasal dryness and sinus-related symptoms include a sensation of sinus drainage and occasional tooth pain. She uses Azelastine  nasal spray for allergies, which causes nasal dryness and minor bleeding. No recent upper respiratory symptoms like coughing or congestion, but she experiences ear ringing and occasional ear pain, likely due to pressure changes.  Current medications include Vesicare  for bladder issues, which is effective but causes dryness in her nose and mouth.     Review of Systems  Constitutional:  Negative for chills and fever.  HENT:  Negative for congestion, ear pain and sore throat.   Gastrointestinal:  Positive for nausea and vomiting.  Neurological:  Positive for dizziness.        Objective:    BP 132/84   Pulse 70   Temp 98.2 F (36.8 C) (Oral)   Resp 16   Ht 5' 5.5 (1.664 m)   Wt 164 lb 14.4 oz (74.8 kg)   SpO2 98%   BMI 27.02  kg/m  BP Readings from Last 3 Encounters:  12/27/23 132/84  11/26/23 130/70  10/12/23 126/78   Wt Readings from Last 3 Encounters:  12/27/23 164 lb 14.4 oz (74.8 kg)  11/26/23 166 lb (75.3 kg)  10/12/23 162 lb (73.5 kg)      Physical Exam Constitutional:      Appearance: Normal appearance.  HENT:     Head: Normocephalic and atraumatic.     Right Ear: Tympanic membrane, ear canal and external ear normal.     Left Ear: Tympanic membrane, ear canal and external ear normal.     Nose: Nose normal.     Mouth/Throat:     Mouth: Mucous membranes are moist.     Comments: Mild sinus drainage Eyes:     Conjunctiva/sclera: Conjunctivae normal.  Cardiovascular:     Rate and Rhythm: Normal rate and regular rhythm.  Pulmonary:     Effort: Pulmonary effort is normal.     Breath sounds: Normal breath sounds.  Skin:    General: Skin is warm and dry.  Neurological:     General: No focal deficit present.     Mental Status: She is alert. Mental status is at baseline.  Psychiatric:        Mood and Affect: Mood normal.        Behavior: Behavior normal.     No results found for any visits on  12/27/23.      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 - Provide Epley maneuver exercises for home practice. - Consider referral to physical therapy if home exercises are insufficient. - Prescribe meclizine  for symptomatic relief of dizziness, cautioning about potential drowsiness.  Allergic rhinitis Symptoms of nasal congestion and sinus drainage suggestive of allergic rhinitis, possibly exacerbated by high ragweed levels. - Recommend nasal saline rinses to alleviate congestion and sinus drainage. - Advise continuation of Azelastine  nasal spray for allergy management.  Nasal dryness secondary to anticholinergic medication Nasal  dryness likely due to Vesicare , causing dry nose and minor epistaxis. - Continue Vesicare  with awareness of side effects. - Use nasal saline rinses to alleviate dryness.  Nausea and vomiting Acute onset possibly related to vertigo or sinus issues. - Prescribe meclizine  for symptomatic relief of nausea associated with dizziness.  Follow-Up Perform rapid COVID-19 test to rule out infection (negative). - Instruct to call back if symptoms do not improve with current management.  - meclizine  (ANTIVERT ) 12.5 MG tablet; Take 1 tablet (12.5 mg total) by mouth 2 (two) times daily as needed for dizziness.  Dispense: 30 tablet; Refill: 0 - POC Covid19/Flu A&B Antigen   Return if symptoms worsen or fail to improve.  Sharyle Fischer, DO

## 2023-12-28 ENCOUNTER — Other Ambulatory Visit: Payer: Self-pay

## 2023-12-28 ENCOUNTER — Telehealth: Payer: Self-pay

## 2023-12-28 ENCOUNTER — Other Ambulatory Visit: Payer: Self-pay | Admitting: Family Medicine

## 2023-12-28 DIAGNOSIS — R110 Nausea: Secondary | ICD-10-CM

## 2023-12-28 DIAGNOSIS — R42 Dizziness and giddiness: Secondary | ICD-10-CM

## 2023-12-28 MED ORDER — ONDANSETRON 4 MG PO TBDP: 4.0000 mg | ORAL_TABLET | Freq: Three times a day (TID) | ORAL | 0 refills | Status: DC | PRN

## 2023-12-28 MED ORDER — ONDANSETRON 4 MG PO TBDP: 4.0000 mg | ORAL_TABLET | Freq: Three times a day (TID) | ORAL | 0 refills | Status: AC | PRN

## 2023-12-28 NOTE — Telephone Encounter (Signed)
 Copied from CRM [id number]. Topic: Clinical - Medication Question >> Dec 28, 2023 11:31 AM Paige [name] wrote: Pt would like to know if she can have a rx called in for Nauseous  after completing her vertigo exercises.

## 2024-01-02 ENCOUNTER — Other Ambulatory Visit: Payer: Self-pay | Admitting: Family Medicine

## 2024-01-02 DIAGNOSIS — R42 Dizziness and giddiness: Secondary | ICD-10-CM

## 2024-01-08 NOTE — Addendum Note (Signed)
 Addended by: YVONE PIERCE C on: 01/08/2024 11:06 AM   Modules accepted: Orders

## 2024-01-09 ENCOUNTER — Ambulatory Visit (INDEPENDENT_AMBULATORY_CARE_PROVIDER_SITE_OTHER): Admitting: Family Medicine

## 2024-01-09 ENCOUNTER — Encounter: Payer: Self-pay | Admitting: Family Medicine

## 2024-01-09 ENCOUNTER — Other Ambulatory Visit: Payer: Self-pay

## 2024-01-09 VITALS — BP 136/82 | HR 64 | Resp 16 | Ht 65.5 in | Wt 167.9 lb

## 2024-01-09 DIAGNOSIS — J018 Other acute sinusitis: Secondary | ICD-10-CM

## 2024-01-09 DIAGNOSIS — N3941 Urge incontinence: Secondary | ICD-10-CM | POA: Diagnosis not present

## 2024-01-09 DIAGNOSIS — R42 Dizziness and giddiness: Secondary | ICD-10-CM

## 2024-01-09 DIAGNOSIS — H9319 Tinnitus, unspecified ear: Secondary | ICD-10-CM | POA: Diagnosis not present

## 2024-01-09 MED ORDER — AZITHROMYCIN 500 MG PO TABS: 500.0000 mg | ORAL_TABLET | Freq: Every day | ORAL | 0 refills | Status: DC

## 2024-01-09 NOTE — Progress Notes (Signed)
 Name: Paige [name]   MRN: [id number]    DOB: [birth year]   Date:01/09/2024       Progress Note  Subjective  Chief Complaint  Chief Complaint  Patient presents with   mouth problem    Odd taste   Fatigue    Since last seen DR.ANDREWS 12/27/23    Discussed the use of AI scribe software for clinical note transcription with the patient, who gave verbal consent to proceed.  History of Present Illness Paige [name] is a 78 year old female who presents with persistent vertigo and a scratchy throat.  She experiences severe vertigo that began on a Tuesday morning upon waking and attempting to sit up, causing the room to spin. This episode led to nausea and vomiting twice, prompting her to seek medical attention. The vertigo has improved but persists, particularly affecting her right side. She uses a walker to stabilize herself when getting up in the morning. She has been performing exercises provided by the clinic, which have helped somewhat. She recalls a similar episode of vertigo about eight years ago, and remembers that it was thought at the time that allergies might have been the cause.  She reports a scratchy throat that started around the same time as the vertigo, lasting for two weeks. She experiences a 'horrible taste' in her mouth, which is bothersome and sometimes leads to nausea. She has been using a neti pot and nasal spray, but the taste persists. No sinus pressure is noted, but her throat feels smaller and her ears, particularly the left, have been popping frequently. She has a history of tinnitus.  She is currently taking Solazepam (VisiCare) for urinary frequency, which she finds very effective, though it causes dry mouth. She is unsure if this medication contributes to the bad taste in her mouth. She has been trying to stay hydrated to alleviate the dry mouth symptoms.  She mentions a recent negative COVID test but notes a loss of taste for food, raising  concerns about a possible prior COVID infection. She is involved in organizing a fundraiser and is concerned about her health impacting her ability to participate.    Patient Active Problem List   Diagnosis Date Noted   RLS (restless legs syndrome) 11/26/2023   Trigger finger of right thumb 11/26/2023   OSA (obstructive sleep apnea) 02/05/2020   Essential hypertension 08/28/2018   Osteopenia 08/28/2018   Hyperlipidemia LDL goal <100 10/27/2014   Allergic rhinitis 10/27/2014   Adult hypothyroidism 10/13/2014    Social History   Tobacco Use   Smoking status: Former    Current packs/day: 0.00    Average packs/day: 0.3 packs/day for 5.0 years (1.3 ttl pk-yrs)    Types: Cigarettes    Start date: 05/01/1972    Quit date: 05/01/1977    Years since quitting: 46.7   Smokeless tobacco: Never  Substance Use Topics   Alcohol use: No     Current Outpatient Medications:    aspirin 81 MG tablet, Take 1 tablet by mouth daily., Disp: , Rfl:    azelastine  (ASTELIN ) 0.1 % nasal spray, Place 2 sprays into both nostrils 2 (two) times daily. Use in each nostril as directed, Disp: 30 mL, Rfl: 2   Biotin 1 MG CAPS, Take 1 tablet by mouth daily., Disp: , Rfl:    Calcium -Phosphorus-Vitamin D  100-50-100 MG-MG-UNIT CHEW, Chew 1 tablet by mouth., Disp: , Rfl:    Coenzyme Q10 100 MG TABS, Take 100 mg by mouth daily., Disp: ,  Rfl:    Cranberry 125 MG TABS, Take 1 tablet by mouth daily., Disp: , Rfl:    cyanocobalamin 100 MCG tablet, Take 1,000 mcg by mouth daily. , Disp: , Rfl:    glucosamine-chondroitin 500-400 MG tablet, Take 1 tablet by mouth 3 (three) times daily., Disp: , Rfl:    levothyroxine  (SYNTHROID ) 88 MCG tablet, Take 1 tablet (88 mcg total) by mouth daily before breakfast., Disp: 90 tablet, Rfl: 1   losartan  (COZAAR ) 25 MG tablet, Take 1 tablet (25 mg total) by mouth daily., Disp: 90 tablet, Rfl: 1   meclizine  (ANTIVERT ) 12.5 MG tablet, Take 1 tablet (12.5 mg total) by mouth 2 (two) times daily  as needed for dizziness., Disp: 30 tablet, Rfl: 0   MULTIPLE VITAMIN PO, Take by mouth daily., Disp: , Rfl:    Multiple Vitamins-Minerals (HAIR SKIN AND NAILS FORMULA PO), Take 1 tablet by mouth daily., Disp: , Rfl:    Omega-3 Fatty Acids (FISH OIL BURP-LESS) 1200 MG CAPS, Take 1 capsule by mouth., Disp: , Rfl:    ondansetron  (ZOFRAN -ODT) 4 MG disintegrating tablet, Take 1 tablet (4 mg total) by mouth every 8 (eight) hours as needed for nausea or vomiting., Disp: 10 tablet, Rfl: 0   rosuvastatin  (CRESTOR ) 20 MG tablet, Take 1 tablet (20 mg total) by mouth daily., Disp: 90 tablet, Rfl: 1   solifenacin  (VESICARE ) 10 MG tablet, Take 1 tablet (10 mg total) by mouth daily., Disp: 90 tablet, Rfl: 1  Allergies  Allergen Reactions   Codeine Nausea And Vomiting    ROS  Ten systems reviewed and is negative except as mentioned in HPI    Objective  Vitals:   01/09/24 1017  BP: 136/82  Pulse: 64  Resp: 16  SpO2: 98%  Weight: 167 lb 14.4 oz (76.2 kg)  Height: 5' 5.5 (1.664 m)    Body mass index is 27.52 kg/m.  Physical Exam CONSTITUTIONAL: Patient appears well-developed and well-nourished. No distress. HEENT: Head atraumatic, normocephalic, neck supple. Oral cavity normal. CARDIOVASCULAR: Normal rate, regular rhythm and normal heart sounds. No murmur heard. No BLE edema. PULMONARY: Effort normal and breath sounds normal. No respiratory distress. PSYCHIATRIC: Patient has a normal mood and affect. Behavior is normal. Judgment and thought content normal.  Recent Results (from the past 2160 hours)  Cologuard     Status: None   Collection Time: 10/30/23  7:20 AM  Result Value Ref Range   COLOGUARD Negative Negative    Comment: The Cologuard (TM) test was performed on this specimen.  NEGATIVE TEST RESULT. A negative Cologuard result indicates a low likelihood that a colorectal cancer (CRC) or advanced adenoma (adenomatous polyps with more advanced pre-malignant features) is present. The  chance that a person with a negative Cologuard test has a colorectal cancer is less than 1 in 1500 (negative predictive value >99.9%) or has an advanced adenoma is less than 5.3% (negative predictive value 94.7%). These data are based on a prospective cross-sectional study of 10,000 individuals at average risk for colorectal cancer who were screened with both Cologuard and colonoscopy. (Imperiale T. et al, N Engl J Med 2014;370(14):1286-1297) The normal value (reference range) for this assay is negative.  COLOGUARD RE-SCREENING RECOMMENDATION: Periodic colorectal cancer screening is an important part of preventive healthcare for asymptomatic individuals at average risk for colorectal cancer. Following a negative Cologuard  result, the American Cancer Society and U.S. Multi-Society Task Force screening guidelines recommend a Cologuard re-screening interval of 3 years.  References: American Cancer Society Guideline  for Colorectal Cancer Screening: https://www.cancer.org/cancer/colon-rectal-cancer/detection-diagnosis-staging/acs-recommendations.html.; Rex DK, Boland CR, Dominitz JK, Colorectal Cancer Screening: Recommendations for Physicians and Patients from the U.S. Multi-Society Task Force on Colorectal Cancer Screening , Am J Gastroenterology 2017; 112:1016-1030.  TEST DESCRIPTION: Composite algorithmic analysis of stool DNA-biomarkers with hemoglobin immunoassay.   Quantitative values of individual biomarkers are not reportable and are not associated with individual biomarker result reference ranges. Cologuard is intended for colorectal cancer screening of adults of either sex, 45 years or older, who are at average-risk for colorectal cancer (CRC). Cologuard has been approved for use by the [place]  FDA. The performance of Cologuard was established in a cross sectional study of average-risk adults aged 25-84. Cologuard performance in patients ages 106 to 44 years was estimated by sub-group analysis of near-age  groups. Colonoscopies performed for a positive result may find as the most clinically significant lesion: colorectal cancer [4.0%], advanced adenoma (including sessile serrated polyps greater than or equal to 1cm diameter) [20%] or non- advanced adenoma [31%]; or no colorectal neoplasia [45%]. These estimates are derived from a prospective cross-sectional screening study of 10,000 individuals at average risk for colorectal cancer who were screened with both Cologuard and colonoscopy. (Imperiale T. et al, LOISE [place] J Med 2014;370(14):1286-1297.) Cologuard may produce a false negative or false positive result (no colorectal cancer or precancerous polyp present at colonoscopy follow up). A negative Cologuard test result does not guarantee the absence of CRC or advanced adenoma  (pre-cancer). The current Cologuard screening interval is every 3 years. Science writer and [place] Therapist, music). Cologuard performance data in a 10,000 patient pivotal study using colonoscopy as the reference method can be accessed at the following location: www.[employer].com/results. Additional description of the Cologuard test process, warnings and precautions can be found at www.cologuard.com.   POC Covid19/Flu A&B Antigen     Status: None   Collection Time: 12/27/23 11:40 AM  Result Value Ref Range   Influenza A Antigen, POC Negative Negative   Influenza B Antigen, POC Negative Negative   Covid Antigen, POC Negative Negative     Assessment & Plan Vertigo Vertigo improved but persists, especially in the morning. Likely related to viral illness or atypical BPPV. - Resubmit referral to vestibular rehabilitation. - Consider ENT referral if symptoms persist after vestibular rehab.  Acute sinusitis Symptoms suggest sinusitis, possibly post-viral or COVID-19 related. - Prescribe azithromycin  500 mg daily for 3 days. - Consider ENT referral if symptoms persist.  Dysgeusia and dry mouth Dysgeusia and dry mouth  possibly linked to solifenacin , though symptoms predate medication. Sinus infection may be the reason - Encourage hydration. - Use saline spray for nasal dryness.  Tinnitus Chronic tinnitus with increased left ear popping, possibly sinus or viral related. - ENT referral for evaluation.  Urinary frequency on solifenacin  Solifenacin  improved urinary frequency. Dry mouth noted as side effect.  General Health Maintenance Discussed aspirin use for primary prevention of cardiovascular events, weighing bleeding risks against benefits. - Discuss risks and benefits of aspirin use.

## 2024-01-15 NOTE — Therapy (Signed)
 OUTPATIENT PHYSICAL THERAPY VESTIBULAR EVALUATION  Patient Name: Paige [name] MRN: [id number] DOB:1945-08-31, 78 y.o., female Today's Date: 01/17/2024  END OF SESSION:  PT End of Session - 01/17/24 1154     Visit Number 1    Number of Visits 9    Date for Recertification  03/13/24    Authorization Type eval: 01/17/24    PT Start Time 1103    PT Stop Time 1150    PT Time Calculation (min) 47 min    Activity Tolerance Other (comment)   Limited by nausea   Behavior During Therapy WFL for tasks assessed/performed         Past Medical History:  Diagnosis Date   Hyperlipidemia    Hypertension    Thyroid  disease    History reviewed. No pertinent surgical history. Patient Active Problem List   Diagnosis Date Noted   Urge incontinence 01/09/2024   RLS (restless legs syndrome) 11/26/2023   Trigger finger of right thumb 11/26/2023   OSA (obstructive sleep apnea) 02/05/2020   Essential hypertension 08/28/2018   Osteopenia 08/28/2018   Hyperlipidemia LDL goal <100 10/27/2014   Allergic rhinitis 10/27/2014   Adult hypothyroidism 10/13/2014   PCP: Sowles, Krichna, MD  REFERRING PROVIDER: Sowles, Krichna, MD   REFERRING DIAG: R42 (ICD-10-CM) - Dizziness   RATIONALE FOR EVALUATION AND TREATMENT: Rehabilitation  THERAPY DIAG: Dizziness and giddiness  ONSET DATE: Early September 2025  FOLLOW-UP APPT SCHEDULED WITH REFERRING PROVIDER: No    SUBJECTIVE:   Chief Complaint: Dizziness  Pertinent History Pt states that 3 weeks ago she began experiencing severe vertigo upon waking and attempting to sit up. This episode led to nausea and vomiting prompting her to seek medical attention. The vertigo has improved but persists, particularly affecting her when she rolls onto her right side. She uses a front wheeled walker when she gets out of bed due to feeling unsteady. Pt reports a scratchy/sore throat which started around the same time but she states that a COVID Test was  negative. She has also been experiencing an altered sense of taste in her mouth as well as occasional difficulty swallowing. Pt complains that her L ear has been popping but she denies any aural fullness or new hearing loss. She has a history of chronic tinnitus. Pt is currently taking Solazepam (VisiCare) for urinary frequency and it causes dry mouth but she is unsure if this is contributing to her altered sense of taste. She had a similar episode 6-7 years ago and at that time saw an ENT who ordered a brain MRI which was WNL. However, her symptoms resolved spontaneously.  MRI Brain/IAC w/wo Contrast 02/03/2017: IMPRESSION: No cause of the presenting symptoms is identified. 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  Description of dizziness: Vertigo Frequency: Daily Duration: A couple minutes Symptom nature: positional Progression of symptoms since onset: better History of similar episodes: Yes  Provocative Factors: supine to sit, rolling onto her R side; Easing Factors: waiting for symptoms to pass, avoiding aggravating positions;  Auditory complaints (tinnitus, pain, drainage, hearing loss, aural fullness): Yes, chronic bilateral tinnitus, with L ear popping, wears hearing aids but no sudden vision;  Vision changes (diplopia, visual field loss, recent changes, recent eye exam): No Chest pain/palpitations: Yes, occasional heartburn History of head injury/concussion: No Stress/anxiety: Yes, some stress with volunteering at church but she enjoys serving; Migraines/headaches: Yes, occasional headaches but no migraines; Nausea/vomiting: Yes Numbness/tingling: No Focal weakness: No Dysarthria/dysphagia/drop attacks: Yes, intermittent dysarthria;  Has  patient fallen in last 6 months? 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  PATIENT GOALS: Resolve symptoms;   OBJECTIVE EXAMINATION  POSTURE: No gross deficits contributing to symptoms  NEUROLOGICAL SCREEN: (2+ unless otherwise noted.) N=normal  Ab=abnormal  Level Dermatome R L Myotome R L Reflex R L  C3 Anterior Neck N N Sidebend C2-3 N N Jaw CN V    C4 Top of Shoulder N N Shoulder Shrug C4 N N Hoffman's UMN    C5 Lateral Upper Arm N N Shoulder ABD C4-5 N N Biceps C5-6    C6 Lateral Arm/ Thumb N N Arm Flex/ Wrist Ext C5-6 N N Brachiorad. C5-6    C7 Middle Finger N N Arm Ext//Wrist Flex C6-7 N N Triceps C7    C8 4th & 5th Finger N N Flex/ Ext Carpi Ulnaris C8 N N Patellar (L3-4)    T1 Medial Arm N N Interossei T1 N N Gastrocnemius    L2 Medial thigh/groin N N Illiopsoas (L2-3) N N     L3 Lower thigh/med.knee N N Quadriceps (L3-4) N N     L4 Medial leg/lat thigh N N Tibialis Ant (L4-5) N N     L5 Lat. leg & dorsal foot N N EHL (L5) N N     S1 post/lat foot/thigh/leg N N Gastrocnemius (S1-2) N N     S2 Post./med. thigh & leg N N Hamstrings (L4-S3) N N      CRANIAL NERVES II, III, IV, VI: Pupils equal and reactive to light, visual acuity and visual fields are intact, extraocular muscles are intact  V: Facial sensation is intact and symmetric bilaterally  VII: Facial strength is intact and symmetric bilaterally  VIII: Hearing is normal as tested by gross conversation XI: Shoulder shrug strength is intact   COORDINATION Finger to Nose: Normal Heel to Shin: Normal Pronator Drift: Negative Rapid Alternating Movements: Normal Finger to Thumb Opposition: Normal   RANGE OF MOTION Cervical Spine AROM WFL and  painless in all planes. No functional focal deficits in AROM noted in BUE/BLE  MANUAL MUSCLE TESTING BUE/BLE strength WNL without focal deficits  TRANSFERS/GAIT Independent for transfers and ambulation without assistive device   PATIENT SURVEYS DHI: Deferred  OCULOMOTOR / VESTIBULAR TESTING  Oculomotor Exam- Room Light  Findings Comments  Ocular Alignment normal   Ocular ROM normal   Spontaneous Nystagmus normal   Gaze-Holding Nystagmus normal   End-Gaze Nystagmus normal   Vergence (normal 2-3) not examined   Smooth Pursuit abnormal Saccadic intrusions  Cross-Cover Test not examined   Saccades abnormal Multiple corrections required  VOR Cancellation normal   Left Head Impulse normal   Right Head Impulse normal   Static Acuity not examined   Dynamic Acuity not examined    Oculomotor Exam- Fixation Suppressed: DeferredBPPV TESTS:  Symptoms Duration Intensity Nystagmus  L Dix-Hallpike Vertigo 3-5s (3s latency) Mild to moderate Upbeating L torsional  R Dix-Hallpike Vertigo 10-12s (5s latency) Moderate to severe Ubeating R torsional  L Head Roll None   None  R Head Roll None  None  L Sidelying Test      R Sidelying Test      (blank = not tested)  Clinical Test of Sensory Interaction for Balance (CTSIB): Deferred  FUNCTIONAL OUTCOME MEASURES  Results Comments  BERG    DGI    FGA    TUG    5TSTS    6 Minute Walk Test    10 Meter Gait Speed    (blank = not tested)   TODAY'S TREATMENT   Canalith Repositioning Treatment Pt treated with 1 bout of Epley Maneuver for presumed R posterior canal BPPV. One minute holds in each position. While in the third position of the R Epley Maneuver (L sidelying) pt develops severe nausea and after sitting up begins to dry heave. No emesis produced. However entire maneuver was able to be completed. HEP provided with home Epley maneuver as well as BPPV education;   PATIENT EDUCATION:  Education details: Plan of care, BPPV, and  HEP; Person educated: Patient Education method: Explanation and handout Education comprehension: verbalized understanding   HOME EXERCISE PROGRAM:  Access Code: [email or web address] URL: [email or web address] Date: 01/17/2024 Prepared by: Selinda Eck  Exercises - Self-Epley Maneuver Right Ear  - 1 x daily - 7 x weekly - 3 reps - 60s in each position hold  Patient Education - BPPV   ASSESSMENT: CLINICAL IMPRESSION: Patient is a 78 y.o. female who was seen today for physical therapy evaluation and treatment for vertigo. Examination consistent with R posterior canal BPPV in addition to possible L posterior canal BPPV. Pt treated during session today with R Epley Maneuver which causes severe nausea and dry heaves. Pt educated about hangover effect and also provided instructions about home maneuver.   OBJECTIVE IMPAIRMENTS: dizziness.   ACTIVITY LIMITATIONS: bed mobility  PARTICIPATION LIMITATIONS: home responsibilities  PERSONAL FACTORS: Past/current experiences and 1-2 comorbidities: OSA and HTN are also affecting patient's functional outcome.   REHAB POTENTIAL: Excellent  CLINICAL DECISION MAKING: Stable/uncomplicated  EVALUATION COMPLEXITY: Low   GOALS:  SHORT TERM GOALS: Target date: 02/14/2024  Pt will be independent with HEP for dizziness in order to decrease symptoms, improve balance,decrease fall risk, and improve function at home. Baseline: Goal status: INITIAL   LONG TERM GOALS: Target date: 03/13/2024  Pt will decrease DHI score by at least 18 points in order to demonstrate clinically significant reduction in disability related to dizziness.  Baseline:  Goal status: INITIAL  2.  Pt will report no further episodes of vertigo when rolling in bed or transitioning from supine to sitting. Baseline:  Goal status: INITIAL  3.  Pt will score >67% on the ABC in order to demonstrate clinically significant improvement in balance confidence.      Baseline:   Goal status: INITIAL   PLAN: PT FREQUENCY: 1x/week  PT DURATION: 8 weeks  PLANNED INTERVENTIONS: Therapeutic exercises, Therapeutic activity, Neuromuscular re-education, Balance training, Gait training, Patient/Family education, Self Care, Joint mobilization, Joint manipulation, Vestibular training, Canalith repositioning, Orthotic/Fit training, DME instructions, Dry Needling, Electrical stimulation, Spinal manipulation, Spinal mobilization, Cryotherapy, Moist heat, Taping, Traction, Ultrasound, Ionotophoresis 4mg /ml Dexamethasone, Manual therapy, and Re-evaluation.  PLAN FOR NEXT SESSION: Retest for BPPV and treat as indicated, once clear perform fixation suppression vestibular/oculomotor and balance testing.   Maanav Kassabian D Daxten Kovalenko PT, DPT, GCS  Kathaleen Dudziak, PT 01/17/2024, 5:28 PM

## 2024-01-17 ENCOUNTER — Ambulatory Visit: Attending: Family Medicine

## 2024-01-17 DIAGNOSIS — R42 Dizziness and giddiness: Secondary | ICD-10-CM | POA: Diagnosis not present

## 2024-01-24 ENCOUNTER — Ambulatory Visit

## 2024-01-24 DIAGNOSIS — R42 Dizziness and giddiness: Secondary | ICD-10-CM | POA: Diagnosis not present

## 2024-01-24 NOTE — Therapy (Signed)
 OUTPATIENT PHYSICAL THERAPY VESTIBULAR TREATMENT  Patient Name: Paige [name] MRN: [id number] DOB:1946-04-16, 78 y.o., female Today's Date: 01/24/2024  END OF SESSION:  PT End of Session - 01/24/24 1143     Visit Number 2    Number of Visits 9    Date for Recertification  03/13/24    Authorization Type eval: 01/17/24    PT Start Time 1145    PT Stop Time 1221    PT Time Calculation (min) 36 min    Activity Tolerance Other (comment)   Limited by nausea   Behavior During Therapy WFL for tasks assessed/performed         Past Medical History:  Diagnosis Date   Hyperlipidemia    Hypertension    Thyroid  disease    History reviewed. No pertinent surgical history. Patient Active Problem List   Diagnosis Date Noted   Urge incontinence 01/09/2024   RLS (restless legs syndrome) 11/26/2023   Trigger finger of right thumb 11/26/2023   OSA (obstructive sleep apnea) 02/05/2020   Essential hypertension 08/28/2018   Osteopenia 08/28/2018   Hyperlipidemia LDL goal <100 10/27/2014   Allergic rhinitis 10/27/2014   Adult hypothyroidism 10/13/2014   PCP: Sowles, Krichna, MD  REFERRING PROVIDER: Sowles, Krichna, MD   REFERRING DIAG: R42 (ICD-10-CM) - Dizziness   RATIONALE FOR EVALUATION AND TREATMENT: Rehabilitation  THERAPY DIAG: Dizziness and giddiness  ONSET DATE: Early September 2025  FOLLOW-UP APPT SCHEDULED WITH REFERRING PROVIDER: No   FROM INITIAL EVALUATION SUBJECTIVE:   Chief Complaint: Dizziness  Pertinent History Pt states that 3 weeks ago she began experiencing severe vertigo upon waking and attempting to sit up. This episode led to nausea and vomiting prompting her to seek medical attention. The vertigo has improved but persists, particularly affecting her when she rolls onto her right side. She uses a front wheeled walker when she gets out of bed due to feeling unsteady. Pt reports a scratchy/sore throat which started around the same time but she states that  a COVID Test was negative. She has also been experiencing an altered sense of taste in her mouth as well as occasional difficulty swallowing. Pt complains that her L ear has been popping but she denies any aural fullness or new hearing loss. She has a history of chronic tinnitus. Pt is currently taking Solazepam (VisiCare) for urinary frequency and it causes dry mouth but she is unsure if this is contributing to her altered sense of taste. She had a similar episode 6-7 years ago and at that time saw an ENT who ordered a brain MRI which was WNL. However, her symptoms resolved spontaneously.  MRI Brain/IAC w/wo Contrast 02/03/2017: IMPRESSION: No cause of the presenting symptoms is identified. 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  Description of dizziness: Vertigo Frequency: Daily Duration: A couple minutes Symptom nature: positional Progression of symptoms since onset: better History of similar episodes: Yes  Provocative Factors: supine to sit, rolling onto her R side; Easing Factors: waiting for symptoms to pass, avoiding aggravating positions;  Auditory complaints (tinnitus, pain, drainage, hearing loss, aural fullness): Yes, chronic bilateral tinnitus, with L ear popping, wears hearing aids but no sudden vision;  Vision changes (diplopia, visual field loss, recent changes, recent eye exam): No Chest pain/palpitations: Yes, occasional heartburn History of head injury/concussion: No Stress/anxiety: Yes, some stress with volunteering at church but she enjoys serving; Migraines/headaches: Yes, occasional headaches but no migraines; Nausea/vomiting: Yes Numbness/tingling: No Focal weakness: No Dysarthria/dysphagia/drop attacks: Yes, intermittent dysarthria;  Has patient fallen in last 6 months? 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  PATIENT GOALS: Resolve symptoms;   OBJECTIVE EXAMINATION  POSTURE: No gross deficits contributing to symptoms  NEUROLOGICAL SCREEN: (2+ unless otherwise noted.) N=normal  Ab=abnormal  Level Dermatome R L Myotome R L Reflex R L  C3 Anterior Neck N N Sidebend C2-3 N N Jaw CN V    C4 Top of Shoulder N N Shoulder Shrug C4 N N Hoffman's UMN    C5 Lateral Upper Arm N N Shoulder ABD C4-5 N N Biceps C5-6    C6 Lateral Arm/ Thumb N N Arm Flex/ Wrist Ext C5-6 N N Brachiorad. C5-6    C7 Middle Finger N N Arm Ext//Wrist Flex C6-7 N N Triceps C7    C8 4th & 5th Finger N N Flex/ Ext Carpi Ulnaris C8 N N Patellar (L3-4)    T1 Medial Arm N N Interossei T1 N N Gastrocnemius    L2 Medial thigh/groin N N Illiopsoas (L2-3) N N     L3 Lower thigh/med.knee N N Quadriceps (L3-4) N N     L4 Medial leg/lat thigh N N Tibialis Ant (L4-5) N N     L5 Lat. leg & dorsal foot N N EHL (L5) N N     S1 post/lat foot/thigh/leg N N Gastrocnemius (S1-2) N N     S2 Post./med. thigh & leg N N Hamstrings (L4-S3) N N      CRANIAL NERVES II, III, IV, VI: Pupils equal and reactive to light, visual acuity and visual fields are intact, extraocular muscles are intact  V: Facial sensation is intact and symmetric bilaterally  VII: Facial strength is intact and symmetric bilaterally  VIII: Hearing is normal as tested by gross conversation XI: Shoulder shrug strength is intact   COORDINATION Finger to Nose: Normal Heel to Shin: Normal Pronator Drift: Negative Rapid Alternating Movements: Normal Finger to Thumb Opposition: Normal   RANGE OF MOTION Cervical  Spine AROM WFL and painless in all planes. No functional focal deficits in AROM noted in BUE/BLE  MANUAL MUSCLE TESTING BUE/BLE strength WNL without focal deficits  TRANSFERS/GAIT Independent for transfers and ambulation without assistive device   PATIENT SURVEYS DHI: Deferred  OCULOMOTOR / VESTIBULAR TESTING  Oculomotor Exam- Room Light  Findings Comments  Ocular Alignment normal   Ocular ROM normal   Spontaneous Nystagmus normal   Gaze-Holding Nystagmus normal   End-Gaze Nystagmus normal   Vergence (normal 2-3) not examined   Smooth Pursuit abnormal Saccadic intrusions  Cross-Cover Test not examined   Saccades abnormal Multiple corrections required  VOR Cancellation normal   Left Head Impulse normal   Right Head Impulse normal   Static Acuity not examined   Dynamic Acuity not examined    Oculomotor Exam- Fixation Suppressed: DeferredBPPV TESTS:  Symptoms Duration Intensity Nystagmus  L Dix-Hallpike Vertigo 3-5s (3s latency) Mild to moderate Upbeating L torsional  R Dix-Hallpike Vertigo 10-12s (5s latency) Moderate to severe Ubeating R torsional  L Head Roll None   None  R Head Roll None  None  L Sidelying Test      R Sidelying Test      (blank = not tested)  Clinical Test of Sensory Interaction for Balance (CTSIB): Deferred  FUNCTIONAL OUTCOME MEASURES  Results Comments  BERG    DGI    FGA    TUG    5TSTS    6 Minute Walk Test    10 Meter Gait Speed    (blank = not tested)   TODAY'S TREATMENT    SUBJECTIVE: Pt reports that she is doing well today. She reports notably improvement in vertigo symptoms since the initial evaluation however still present. Denies pain. No specific questions or concerns.    PAIN: Denies   Neuromuscular Re-education  Interval history obtained, discussed plan of care, updated HEP, education about BPPV and today's findings;    Canalith Repositioning Treatment Positive R Dix-Hallpike Test for upbeating R torsional nystagmus  lasting 5-8s (10s latency) with concurrent vertigo of mild to moderate severity. Pt treated with 1 bout of Epley Maneuver for presumed L posterior canal BPPV. One minute holds in each position and retesting afterward. During retesting pt with positive Dix-Hallpike Test bilaterally for appropriate nystagmus and vertigo with more significant vertigo and more vigorous nystagmus on the L side. Pt treated with 1 bout of Epley Maneuver for presumed L posterior canal BPPV. One minute holds in each position and retesting afterward. L Sidelying Test is negative for vertigo and nystagmus. R Sidelying Test is positive for faint upbeating R torsional nystagmus with concurrent mild vertigo. Pt treated with one bout of R Liberatory Maneuver with one minute holds in each position.    PATIENT EDUCATION:  Education details: Plan of care, BPPV, and HEP; Person educated: Patient Education method: Explanation and handout Education comprehension: verbalized understanding   HOME EXERCISE PROGRAM:  Access Code: [email or web address] URL: [email or web address] Date: 01/24/2024 Prepared by: Paige [name]  Exercises - Self-Epley Maneuver Right Ear  - 1 x daily - 3 x weekly - 3 reps - 60s in each position hold - Self-Epley Maneuver Left Ear  - 1 x daily - 3 x weekly - 3 reps - 60s in each position hold  Patient Education - BPPV  Patient Education - BPPV   ASSESSMENT: CLINICAL IMPRESSION: Positive R Dix-Hallpike Test for upbeating R torsional nystagmus lasting 5-8s (10s latency) with concurrent vertigo of mild to moderate severity. Pt treated with 1 bout of Epley Maneuver for presumed L posterior canal BPPV. One minute holds in each position and retesting afterward. During retesting pt with positive Dix-Hallpike Test bilaterally for appropriate nystagmus and vertigo with more significant vertigo and more vigorous nystagmus on the L side. Pt treated with 1 bout of Epley Maneuver for presumed L posterior canal BPPV.  One minute holds in each position and retesting afterward. L Sidelying Test is negative for vertigo and nystagmus. R Sidelying Test is positive for faint upbeating R torsional nystagmus with concurrent mild vertigo. Pt treated with one bout of R Liberatory Maneuver with one minute holds in each position. Pt with nausea but no vomiting today. HEP updated to include L Epley maneuver and instructions provided. Plan to retest at next session for BPPV and treat as indicated at next session. Pt will benefit from PT services to address deficits in vertigo in order to decrease symptoms, decrease risk for falls, and improve symptom-free function at home.   OBJECTIVE IMPAIRMENTS: dizziness.   ACTIVITY LIMITATIONS: bed mobility  PARTICIPATION LIMITATIONS: home responsibilities  PERSONAL FACTORS: Past/current experiences and 1-2 comorbidities:  OSA and HTN are also affecting patient's functional outcome.   REHAB POTENTIAL: Excellent  CLINICAL DECISION MAKING: Stable/uncomplicated  EVALUATION COMPLEXITY: Low   GOALS:  SHORT TERM GOALS: Target date: 02/14/2024  Pt will be independent with HEP for dizziness in order to decrease symptoms, improve balance,decrease fall risk, and improve function at home. Baseline: Goal status: INITIAL   LONG TERM GOALS: Target date: 03/13/2024  Pt will decrease DHI score by at least 18 points in order to demonstrate clinically significant reduction in disability related to dizziness.  Baseline:  Goal status: INITIAL  2.  Pt will report no further episodes of vertigo when rolling in bed or transitioning from supine to sitting. Baseline:  Goal status: INITIAL  3.  Pt will score >67% on the ABC in order to demonstrate clinically significant improvement in balance confidence.      Baseline:  Goal status: INITIAL   PLAN: PT FREQUENCY: 1x/week  PT DURATION: 8 weeks  PLANNED INTERVENTIONS: Therapeutic exercises, Therapeutic activity, Neuromuscular re-education,  Balance training, Gait training, Patient/Family education, Self Care, Joint mobilization, Joint manipulation, Vestibular training, Canalith repositioning, Orthotic/Fit training, DME instructions, Dry Needling, Electrical stimulation, Spinal manipulation, Spinal mobilization, Cryotherapy, Moist heat, Taping, Traction, Ultrasound, Ionotophoresis 4mg /ml Dexamethasone, Manual therapy, and Re-evaluation.  PLAN FOR NEXT SESSION: Retest for BPPV and treat as indicated, once clear perform fixation suppression vestibular/oculomotor and balance testing.   Paige [name] PT, DPT, GCS  Paige [name], PT 01/24/2024, 12:23 PM

## 2024-01-31 ENCOUNTER — Ambulatory Visit: Attending: Family Medicine

## 2024-01-31 DIAGNOSIS — R42 Dizziness and giddiness: Secondary | ICD-10-CM | POA: Diagnosis not present

## 2024-01-31 NOTE — Therapy (Signed)
 OUTPATIENT PHYSICAL THERAPY VESTIBULAR TREATMENT  Patient Name: Paige [name] Nylin MRN: [id number] DOB:1945/07/27, 78 y.o., female Today's Date: 01/31/2024  END OF SESSION:  PT End of Session - 01/31/24 1147     Visit Number 3    Number of Visits 9    Date for Recertification  03/13/24    Authorization Type eval: 01/17/24    PT Start Time 1148    PT Stop Time 1215    PT Time Calculation (min) 27 min    Activity Tolerance Patient tolerated treatment well    Behavior During Therapy WFL for tasks assessed/performed         Past Medical History:  Diagnosis Date   Hyperlipidemia    Hypertension    Thyroid  disease    History reviewed. No pertinent surgical history. Patient Active Problem List   Diagnosis Date Noted   Urge incontinence 01/09/2024   RLS (restless legs syndrome) 11/26/2023   Trigger finger of right thumb 11/26/2023   OSA (obstructive sleep apnea) 02/05/2020   Essential hypertension 08/28/2018   Osteopenia 08/28/2018   Hyperlipidemia LDL goal <100 10/27/2014   Allergic rhinitis 10/27/2014   Adult hypothyroidism 10/13/2014   PCP: Sowles, Krichna, MD  REFERRING PROVIDER: Sowles, Krichna, MD   REFERRING DIAG: R42 (ICD-10-CM) - Dizziness   RATIONALE FOR EVALUATION AND TREATMENT: Rehabilitation  THERAPY DIAG: Dizziness and giddiness  ONSET DATE: Early September 2025  FOLLOW-UP APPT SCHEDULED WITH REFERRING PROVIDER: No   FROM INITIAL EVALUATION SUBJECTIVE:   Chief Complaint: Dizziness  Pertinent History Pt states that 3 weeks ago she began experiencing severe vertigo upon waking and attempting to sit up. This episode led to nausea and vomiting prompting her to seek medical attention. The vertigo has improved but persists, particularly affecting her when she rolls onto her right side. She uses a front wheeled walker when she gets out of bed due to feeling unsteady. Pt reports a scratchy/sore throat which started around the same time but she states that a  COVID Test was negative. She has also been experiencing an altered sense of taste in her mouth as well as occasional difficulty swallowing. Pt complains that her L ear has been popping but she denies any aural fullness or new hearing loss. She has a history of chronic tinnitus. Pt is currently taking Solazepam (VisiCare) for urinary frequency and it causes dry mouth but she is unsure if this is contributing to her altered sense of taste. She had a similar episode 6-7 years ago and at that time saw an ENT who ordered a brain MRI which was WNL. However, her symptoms resolved spontaneously.  MRI Brain/IAC w/wo Contrast 02/03/2017: IMPRESSION: No cause of the presenting symptoms is identified. 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  Description of dizziness: Vertigo Frequency: Daily Duration: A couple minutes Symptom nature: positional Progression of symptoms since onset: better History of similar episodes: Yes  Provocative Factors: supine to sit, rolling onto her R side; Easing Factors: waiting for symptoms to pass, avoiding aggravating positions;  Auditory complaints (tinnitus, pain, drainage, hearing loss, aural fullness): Yes, chronic bilateral tinnitus, with L ear popping, wears hearing aids but no sudden vision;  Vision changes (diplopia, visual field loss, recent changes, recent eye exam): No Chest pain/palpitations: Yes, occasional heartburn History of head injury/concussion: No Stress/anxiety: Yes, some stress with volunteering at church but she enjoys serving; Migraines/headaches: Yes, occasional headaches but no migraines; Nausea/vomiting: Yes Numbness/tingling: No Focal weakness: No Dysarthria/dysphagia/drop attacks: Yes, intermittent dysarthria;  Has  patient fallen in last 6 months? 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  PATIENT GOALS: Resolve symptoms;   OBJECTIVE EXAMINATION  POSTURE: No gross deficits contributing to symptoms  NEUROLOGICAL SCREEN: (2+ unless otherwise noted.) N=normal  Ab=abnormal  Level Dermatome R L Myotome R L Reflex R L  C3 Anterior Neck N N Sidebend C2-3 N N Jaw CN V    C4 Top of Shoulder N N Shoulder Shrug C4 N N Hoffman's UMN    C5 Lateral Upper Arm N N Shoulder ABD C4-5 N N Biceps C5-6    C6 Lateral Arm/ Thumb N N Arm Flex/ Wrist Ext C5-6 N N Brachiorad. C5-6    C7 Middle Finger N N Arm Ext//Wrist Flex C6-7 N N Triceps C7    C8 4th & 5th Finger N N Flex/ Ext Carpi Ulnaris C8 N N Patellar (L3-4)    T1 Medial Arm N N Interossei T1 N N Gastrocnemius    L2 Medial thigh/groin N N Illiopsoas (L2-3) N N     L3 Lower thigh/med.knee N N Quadriceps (L3-4) N N     L4 Medial leg/lat thigh N N Tibialis Ant (L4-5) N N     L5 Lat. leg & dorsal foot N N EHL (L5) N N     S1 post/lat foot/thigh/leg N N Gastrocnemius (S1-2) N N     S2 Post./med. thigh & leg N N Hamstrings (L4-S3) N N      CRANIAL NERVES II, III, IV, VI: Pupils equal and reactive to light, visual acuity and visual fields are intact, extraocular muscles are intact  V: Facial sensation is intact and symmetric bilaterally  VII: Facial strength is intact and symmetric bilaterally  VIII: Hearing is normal as tested by gross conversation XI: Shoulder shrug strength is intact   COORDINATION Finger to Nose: Normal Heel to Shin: Normal Pronator Drift: Negative Rapid Alternating Movements: Normal Finger to Thumb Opposition: Normal   RANGE OF MOTION Cervical  Spine AROM WFL and painless in all planes. No functional focal deficits in AROM noted in BUE/BLE  MANUAL MUSCLE TESTING BUE/BLE strength WNL without focal deficits  TRANSFERS/GAIT Independent for transfers and ambulation without assistive device   PATIENT SURVEYS DHI: Deferred  OCULOMOTOR / VESTIBULAR TESTING  Oculomotor Exam- Room Light  Findings Comments  Ocular Alignment normal   Ocular ROM normal   Spontaneous Nystagmus normal   Gaze-Holding Nystagmus normal   End-Gaze Nystagmus normal   Vergence (normal 2-3) not examined   Smooth Pursuit abnormal Saccadic intrusions  Cross-Cover Test not examined   Saccades abnormal Multiple corrections required  VOR Cancellation normal   Left Head Impulse normal   Right Head Impulse normal   Static Acuity not examined   Dynamic Acuity not examined    Oculomotor Exam- Fixation Suppressed: DeferredBPPV TESTS:  Symptoms Duration Intensity Nystagmus  L Dix-Hallpike Vertigo 3-5s (3s latency) Mild to moderate Upbeating L torsional  R Dix-Hallpike Vertigo 10-12s (5s latency) Moderate to severe Ubeating R torsional  L Head Roll None   None  R Head Roll None  None  L Sidelying Test      R Sidelying Test      (blank = not tested)  Clinical Test of Sensory Interaction for Balance (CTSIB): Deferred  FUNCTIONAL OUTCOME MEASURES  Results Comments  BERG    DGI    FGA    TUG    5TSTS    6 Minute Walk Test    10 Meter Gait Speed    (blank = not tested)   TODAY'S TREATMENT    SUBJECTIVE: Pt reports that she is doing well today. No episodes of vertigo since the last therapy session. Denies pain. No specific questions or concerns.    PAIN: Denies   Neuromuscular Re-education  Interval history obtained, discussed plan of care, education about BPPV and today's findings;    Canalith Repositioning Treatment  BPPV TESTS:  Symptoms Duration Intensity Nystagmus  L Dix-Hallpike None   None  R Dix-Hallpike None   None  L Head Roll  None   None  R Head Roll Vertigo 5-8s Mild Faint upbeating R torsional nystagmus lasting approximately 10s  L Sidelying Test None   None  R Sidelying Test None   None  (blank = not tested)  Positive R Roll Test for faint upbeating R torsional nystagmus lasting approximatley 10s with concurrent mild vertigo. Pt treated with 1 bout of R Liberatory Maneuver followed by two bouts of the R Epley Maneuver for presumed R posterior canal BPPV. One minute holds in each position and retesting between maneuvers. All restesting with Dix-Hallpike Test is negative on the R side.     PATIENT EDUCATION:  Education details: Plan of care, BPPV, and HEP; Person educated: Patient Education method: Explanation and handout Education comprehension: verbalized understanding   HOME EXERCISE PROGRAM:  Access Code: [email or web address] URL: [email or web address] Date: 01/31/2024 Prepared by: Selinda Eck  Exercises - Self-Epley Maneuver Right Ear  - 1 x daily - 3 x weekly - 3 reps - 60s in each position hold  Patient Education - BPPV   ASSESSMENT: CLINICAL IMPRESSION: Positive R Roll Test for faint upbeating R torsional nystagmus lasting approximatley 10s with concurrent mild vertigo. Pt treated with 1 bout of R Liberatory Maneuver followed by two bouts of the R Epley Maneuver for presumed R posterior canal BPPV. One minute holds in each position and retesting between maneuvers. All restesting with Dix-Hallpike Test is negative on the R side. Plan to retest at next session for BPPV and treat as indicated. Pt will benefit from PT services to address deficits in vertigo in order to decrease symptoms, decrease risk for falls, and improve symptom-free function at home.   OBJECTIVE IMPAIRMENTS: dizziness.   ACTIVITY LIMITATIONS: bed mobility  PARTICIPATION LIMITATIONS: home responsibilities  PERSONAL FACTORS: Past/current experiences and 1-2 comorbidities: OSA and HTN are also affecting patient's  functional outcome.   REHAB POTENTIAL: Excellent  CLINICAL DECISION MAKING: Stable/uncomplicated  EVALUATION COMPLEXITY: Low   GOALS:  SHORT TERM GOALS: Target date: 02/14/2024  Pt will be independent with HEP for dizziness in order to decrease symptoms, improve balance,decrease fall risk, and improve function at home. Baseline: Goal status: INITIAL   LONG TERM GOALS: Target date: 03/13/2024  Pt will decrease DHI score by at least 18 points in order to demonstrate clinically significant reduction in disability related to dizziness.  Baseline:  Goal status: INITIAL  2.  Pt will report no further episodes of vertigo when rolling in bed or transitioning from supine to sitting. Baseline:  Goal status: INITIAL  3.  Pt  will score >67% on the ABC in order to demonstrate clinically significant improvement in balance confidence.      Baseline:  Goal status: INITIAL   PLAN: PT FREQUENCY: 1x/week  PT DURATION: 8 weeks  PLANNED INTERVENTIONS: Therapeutic exercises, Therapeutic activity, Neuromuscular re-education, Balance training, Gait training, Patient/Family education, Self Care, Joint mobilization, Joint manipulation, Vestibular training, Canalith repositioning, Orthotic/Fit training, DME instructions, Dry Needling, Electrical stimulation, Spinal manipulation, Spinal mobilization, Cryotherapy, Moist heat, Taping, Traction, Ultrasound, Ionotophoresis 4mg /ml Dexamethasone, Manual therapy, and Re-evaluation.  PLAN FOR NEXT SESSION: Retest for BPPV and treat as indicated, once clear perform fixation suppression vestibular/oculomotor and balance testing.   Jalessa Peyser D Giliana Vantil PT, DPT, GCS  Somya Jauregui, PT 01/31/2024, 1:28 PM

## 2024-02-05 DIAGNOSIS — R682 Dry mouth, unspecified: Secondary | ICD-10-CM | POA: Diagnosis not present

## 2024-02-05 DIAGNOSIS — R42 Dizziness and giddiness: Secondary | ICD-10-CM | POA: Diagnosis not present

## 2024-02-05 DIAGNOSIS — R432 Parageusia: Secondary | ICD-10-CM | POA: Diagnosis not present

## 2024-02-05 DIAGNOSIS — R1314 Dysphagia, pharyngoesophageal phase: Secondary | ICD-10-CM | POA: Diagnosis not present

## 2024-02-07 ENCOUNTER — Ambulatory Visit

## 2024-02-07 DIAGNOSIS — R42 Dizziness and giddiness: Secondary | ICD-10-CM | POA: Diagnosis not present

## 2024-02-07 NOTE — Therapy (Signed)
 OUTPATIENT PHYSICAL THERAPY VESTIBULAR TREATMENT/DISCHARGE  Patient Name: Paige [name] Nylin MRN: [id number] DOB:1946/04/29, 78 y.o., female Today's Date: 02/07/2024  END OF SESSION:  PT End of Session - 02/07/24 1135     Visit Number 4    Number of Visits 9    Date for Recertification  03/13/24    Authorization Type eval: 01/17/24    PT Start Time 1145    PT Stop Time 1230    PT Time Calculation (min) 45 min    Activity Tolerance Patient tolerated treatment well    Behavior During Therapy [facility] for tasks assessed/performed         Past Medical History:  Diagnosis Date   Hyperlipidemia    Hypertension    Thyroid  disease    History reviewed. No pertinent surgical history. Patient Active Problem List   Diagnosis Date Noted   Urge incontinence 01/09/2024   RLS (restless legs syndrome) 11/26/2023   Trigger finger of right thumb 11/26/2023   OSA (obstructive sleep apnea) 02/05/2020   Essential hypertension 08/28/2018   Osteopenia 08/28/2018   Hyperlipidemia LDL goal <100 10/27/2014   Allergic rhinitis 10/27/2014   Adult hypothyroidism 10/13/2014   PCP: Sowles, Krichna, MD  REFERRING PROVIDER: Sowles, Krichna, MD   REFERRING DIAG: R42 (ICD-10-CM) - Dizziness   RATIONALE FOR EVALUATION AND TREATMENT: Rehabilitation  THERAPY DIAG: Dizziness and giddiness  ONSET DATE: Early September 2025  FOLLOW-UP APPT SCHEDULED WITH REFERRING PROVIDER: No   FROM INITIAL EVALUATION SUBJECTIVE:   Chief Complaint: Dizziness  Pertinent History Pt states that 3 weeks ago she began experiencing severe vertigo upon waking and attempting to sit up. This episode led to nausea and vomiting prompting her to seek medical attention. The vertigo has improved but persists, particularly affecting her when she rolls onto her right side. She uses a front wheeled walker when she gets out of bed due to feeling unsteady. Pt reports a scratchy/sore throat which started around the same time but she  states that a COVID Test was negative. She has also been experiencing an altered sense of taste in her mouth as well as occasional difficulty swallowing. Pt complains that her L ear has been popping but she denies any aural fullness or new hearing loss. She has a history of chronic tinnitus. Pt is currently taking Solazepam (VisiCare) for urinary frequency and it causes dry mouth but she is unsure if this is contributing to her altered sense of taste. She had a similar episode 6-7 years ago and at that time saw an ENT who ordered a brain MRI which was WNL. However, her symptoms resolved spontaneously.  MRI Brain/IAC w/wo Contrast 02/03/2017: IMPRESSION: No cause of the presenting symptoms is identified. 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  Description of dizziness: Vertigo Frequency: Daily Duration: A couple minutes Symptom nature: positional Progression of symptoms since onset: better History of similar episodes: Yes  Provocative Factors: supine to sit, rolling onto her R side; Easing Factors: waiting for symptoms to pass, avoiding aggravating positions;  Auditory complaints (tinnitus, pain, drainage, hearing loss, aural fullness): Yes, chronic bilateral tinnitus, with L ear popping, wears hearing aids but no sudden vision;  Vision changes (diplopia, visual field loss, recent changes, recent eye exam): No Chest pain/palpitations: Yes, occasional heartburn History of head injury/concussion: No Stress/anxiety: Yes, some stress with volunteering at church but she enjoys serving; Migraines/headaches: Yes, occasional headaches but no migraines; Nausea/vomiting: Yes Numbness/tingling: No Focal weakness: No Dysarthria/dysphagia/drop attacks: Yes, intermittent dysarthria;  Has  patient fallen in last 6 months? 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  PATIENT GOALS: Resolve symptoms;   OBJECTIVE EXAMINATION  POSTURE: No gross deficits contributing to symptoms  NEUROLOGICAL SCREEN: (2+ unless otherwise noted.) N=normal  Ab=abnormal  Level Dermatome R L Myotome R L Reflex R L  C3 Anterior Neck N N Sidebend C2-3 N N Jaw CN V    C4 Top of Shoulder N N Shoulder Shrug C4 N N Hoffman's UMN    C5 Lateral Upper Arm N N Shoulder ABD C4-5 N N Biceps C5-6    C6 Lateral Arm/ Thumb N N Arm Flex/ Wrist Ext C5-6 N N Brachiorad. C5-6    C7 Middle Finger N N Arm Ext//Wrist Flex C6-7 N N Triceps C7    C8 4th & 5th Finger N N Flex/ Ext Carpi Ulnaris C8 N N Patellar (L3-4)    T1 Medial Arm N N Interossei T1 N N Gastrocnemius    L2 Medial thigh/groin N N Illiopsoas (L2-3) N N     L3 Lower thigh/med.knee N N Quadriceps (L3-4) N N     L4 Medial leg/lat thigh N N Tibialis Ant (L4-5) N N     L5 Lat. leg & dorsal foot N N EHL (L5) N N     S1 post/lat foot/thigh/leg N N Gastrocnemius (S1-2) N N     S2 Post./med. thigh & leg N N Hamstrings (L4-S3) N N      CRANIAL NERVES II, III, IV, VI: Pupils equal and reactive to light, visual acuity and visual fields are intact, extraocular muscles are intact  V: Facial sensation is intact and symmetric bilaterally  VII: Facial strength is intact and symmetric bilaterally  VIII: Hearing is normal as tested by gross conversation XI: Shoulder shrug strength is intact   COORDINATION Finger to Nose: Normal Heel to Shin: Normal Pronator Drift: Negative Rapid Alternating Movements: Normal Finger to Thumb Opposition: Normal   RANGE OF  MOTION Cervical Spine AROM WFL and painless in all planes. No functional focal deficits in AROM noted in BUE/BLE  MANUAL MUSCLE TESTING BUE/BLE strength WNL without focal deficits  TRANSFERS/GAIT Independent for transfers and ambulation without assistive device   PATIENT SURVEYS DHI: Deferred  OCULOMOTOR / VESTIBULAR TESTING  Oculomotor Exam- Room Light  Findings Comments  Ocular Alignment normal   Ocular ROM normal   Spontaneous Nystagmus normal   Gaze-Holding Nystagmus normal   End-Gaze Nystagmus normal   Vergence (normal 2-3) not examined   Smooth Pursuit abnormal Saccadic intrusions  Cross-Cover Test not examined   Saccades abnormal Multiple corrections required  VOR Cancellation normal   Left Head Impulse normal   Right Head Impulse normal   Static Acuity not examined   Dynamic Acuity not examined    Oculomotor Exam- Fixation Suppressed: DeferredBPPV TESTS:  Symptoms Duration Intensity Nystagmus  L Dix-Hallpike Vertigo 3-5s (3s latency) Mild to moderate Upbeating L torsional  R Dix-Hallpike Vertigo 10-12s (5s latency) Moderate to severe Ubeating R torsional  L Head Roll None   None  R Head Roll None  None  L Sidelying Test      R Sidelying Test      (blank = not tested)  Clinical Test of Sensory Interaction for Balance (CTSIB): Deferred  FUNCTIONAL OUTCOME MEASURES  Results Comments  BERG    DGI    FGA    TUG    5TSTS    6 Minute Walk Test    10 Meter Gait Speed    (blank = not tested)   TODAY'S TREATMENT    SUBJECTIVE: Pt reports that she is doing well today. No episodes of vertigo since the last therapy session. She saw the ENT who mentioned Meneire's as a possible explanation for some of her symptoms. Pt plans to proceed with VNG testing.    PAIN: Denies   Neuromuscular Re-education  Interval history obtained and discussed plan of care  BPPV TESTS:  Symptoms Duration Intensity Nystagmus  L Dix-Hallpike None   None  R Dix-Hallpike None    None  L Head Roll None   None  R Head Roll None   None  L Sidelying Test None   None  R Sidelying Test None   None  (blank = not tested)  Oculomotor Exam- Fixation Suppressed  Findings Comments  Ocular Alignment normal   Spontaneous Nystagmus normal   Gaze-Holding Nystagmus normal   End-Gaze Nystagmus normal   Head Shaking Nystagmus normal   Pressure-Induced Nystagmus normal   Hyperventilation Induced Nystagmus normal   Skull Vibration Induced Nystagmus abnormal L horizontal beating nystagmus with vibration on L mastoid, no nystagmus with vibration on R mastoid;    FGA: 29/30; BERG: 54/56; HEP updated and reviewed with patient;  PATIENT EDUCATION:  Education details: Examination findings, outcome measures, updated HEP and discharge; Person educated: Patient Education method: Explanation and handout Education comprehension: verbalized understanding   HOME EXERCISE PROGRAM:  Access Code: [email or web address] URL: [email or web address] Date: 02/07/2024 Prepared by: Selinda Eck  Exercises - Self-Epley Maneuver Right Ear  - 1 x daily - 3 reps - 60s in each position hold - Standing Tandem Balance with Counter Support  - 1 x daily - 7 x weekly - 3 sets - 30s hold - Standing Tandem Balance with Counter Support (Mirrored)  - 1 x daily - 7 x weekly - 3 reps - 30s hold - Walking Tandem Stance  - 1 x daily - 7 x weekly - 3 reps - 30s hold - Single Leg Stance  - 1 x daily - 7 x weekly - 3 reps - 30s hold - Single Leg Stance (Mirrored)  - 1 x daily - 7 x weekly - 3 reps - 30s hold  Patient Education - BPPV   ASSESSMENT: CLINICAL IMPRESSION: All BPPV testing is negative today for both vertigo and nystagmus. Performed additional testing and pt presents with pure L horizontal-beating nystagmus with skull vibration. Performed additional balance testing and pt scored 29/30 on the FGA and 54/56 on the BERG. Pt issued HEP related to her balance deficits. Pt is ready for discharge at  this time.  OBJECTIVE IMPAIRMENTS: dizziness.   ACTIVITY LIMITATIONS: bed mobility  PARTICIPATION LIMITATIONS: home responsibilities  PERSONAL FACTORS: Past/current experiences and 1-2 comorbidities: OSA and HTN are also affecting patient's functional outcome.   REHAB POTENTIAL: Excellent  CLINICAL DECISION MAKING: Stable/uncomplicated  EVALUATION COMPLEXITY: Low   GOALS:  SHORT TERM GOALS: Target date: 02/14/2024  Pt will be independent with HEP for dizziness in order to decrease symptoms, improve balance,decrease fall risk, and improve function at home. Baseline: Goal  status: INITIAL   LONG TERM GOALS: Target date: 03/13/2024  Pt will decrease DHI score by at least 18 points in order to demonstrate clinically significant reduction in disability related to dizziness.  Baseline:  Goal status: DISCONTINUED  2.  Pt will report no further episodes of vertigo when rolling in bed or transitioning from supine to sitting. Baseline: No further episodes of vertigo Goal status: MET  3.  Pt will score >67% on the ABC in order to demonstrate clinically significant improvement in balance confidence.      Baseline:  Goal status: DISCONTINUED   PLAN: PT FREQUENCY: 1x/week  PT DURATION: 8 weeks  PLANNED INTERVENTIONS: Therapeutic exercises, Therapeutic activity, Neuromuscular re-education, Balance training, Gait training, Patient/Family education, Self Care, Joint mobilization, Joint manipulation, Vestibular training, Canalith repositioning, Orthotic/Fit training, DME instructions, Dry Needling, Electrical stimulation, Spinal manipulation, Spinal mobilization, Cryotherapy, Moist heat, Taping, Traction, Ultrasound, Ionotophoresis 4mg /ml Dexamethasone, Manual therapy, and Re-evaluation.  PLAN FOR NEXT SESSION: Discharge   Selinda JONETTA Eck PT, DPT, GCS  Yeila Morro, PT 02/07/2024, 4:25 PM

## 2024-02-14 ENCOUNTER — Other Ambulatory Visit: Payer: Self-pay | Admitting: Otolaryngology

## 2024-02-14 ENCOUNTER — Encounter

## 2024-02-14 DIAGNOSIS — R1310 Dysphagia, unspecified: Secondary | ICD-10-CM

## 2024-02-14 DIAGNOSIS — R682 Dry mouth, unspecified: Secondary | ICD-10-CM

## 2024-02-19 ENCOUNTER — Other Ambulatory Visit: Payer: Self-pay

## 2024-02-19 DIAGNOSIS — E039 Hypothyroidism, unspecified: Secondary | ICD-10-CM | POA: Diagnosis not present

## 2024-02-19 DIAGNOSIS — R432 Parageusia: Secondary | ICD-10-CM | POA: Diagnosis not present

## 2024-02-19 LAB — TSH: TSH: 0.62 m[IU]/L (ref 0.40–4.50)

## 2024-02-19 LAB — VITAMIN B12: Vitamin B-12: 560 pg/mL (ref 200–1100)

## 2024-02-20 ENCOUNTER — Ambulatory Visit: Payer: Self-pay | Admitting: Family Medicine

## 2024-02-21 ENCOUNTER — Encounter

## 2024-02-25 ENCOUNTER — Ambulatory Visit
Admission: RE | Admit: 2024-02-25 | Discharge: 2024-02-25 | Disposition: A | Discharge status: Home / Self Care | Source: Ambulatory Visit | Attending: Otolaryngology | Admitting: Otolaryngology

## 2024-02-25 DIAGNOSIS — R682 Dry mouth, unspecified: Secondary | ICD-10-CM | POA: Insufficient documentation

## 2024-02-25 DIAGNOSIS — R1310 Dysphagia, unspecified: Secondary | ICD-10-CM | POA: Diagnosis not present

## 2024-02-25 NOTE — Therapy (Signed)
 Modified Barium Swallow Study  Patient Details  Name: Paige [name] Nylin MRN: [id number] Date of Birth: 28-Dec-1945  Today's Date: 02/25/2024  Modified Barium Swallow completed.  Full report located under Chart Review in the Imaging Section.  History of Present Illness Pt is a 78 y.o. female who presents for MBSS due to complaints of globus sensation and solid food dysphagia. PMHx: BPPV, HLD, HTN, OSA, allergic rhinitis, thyroid  disease.   Clinical Impression Pt seen for MBSS. Pt demonstrated intact oropharyngeal swallowing. Concern for primary esophageal dysphagia given esophageal retention present with solid trials during esophageal sweep. Liquid wash aided in clearance. Recommend continuation of current diet with well masticated solids, alternation of bites/sips, and upright positioning during and 60-90 minutes after POs. Consider GI work up given pt's complaint of solid food dysphagia, globus sensation with and without food, and heartburn as well as presentation during esophageal screening on today's MBSS. No f/u ST services recommended at this time. Factors that may increase risk of adverse event in presence of aspiration Noe & Lianne 2021):  (reports GERD)  Swallow Evaluation Recommendations Recommendations: PO diet PO Diet Recommendation: Regular;Thin liquids (Level 0) Liquid Administration via: Cup;Spoon;Straw Medication Administration:  (as tolerated) Supervision: Patient able to self-feed Swallowing strategies  : Follow solids with liquids (masticate solids thoroughly) Postural changes: Position pt fully upright for meals (upright 60-90 minutes after POs) Oral care recommendations: Oral care BID (2x/day) Recommended consults: Consider GI consultation;Consider esophageal assessment     Delon Bangs, M.S., CCC-SLP Speech-Language Pathologist [place] [facility] [phone] (ASCOM)  Delon HERO Jacora Hopkins 02/25/2024,1:39 PM

## 2024-02-26 ENCOUNTER — Other Ambulatory Visit: Payer: Self-pay | Admitting: Family Medicine

## 2024-02-26 DIAGNOSIS — Z1231 Encounter for screening mammogram for malignant neoplasm of breast: Secondary | ICD-10-CM

## 2024-02-28 ENCOUNTER — Encounter

## 2024-03-04 ENCOUNTER — Other Ambulatory Visit: Payer: Self-pay | Admitting: Gastroenterology

## 2024-03-04 DIAGNOSIS — K219 Gastro-esophageal reflux disease without esophagitis: Secondary | ICD-10-CM | POA: Diagnosis not present

## 2024-03-04 DIAGNOSIS — R1310 Dysphagia, unspecified: Secondary | ICD-10-CM

## 2024-03-06 ENCOUNTER — Encounter

## 2024-03-24 ENCOUNTER — Other Ambulatory Visit: Payer: Self-pay | Admitting: Family Medicine

## 2024-03-24 MED ORDER — TOLTERODINE TARTRATE ER 4 MG PO CP24: 4.0000 mg | ORAL_CAPSULE | Freq: Every day | ORAL | 0 refills | Status: DC

## 2024-03-26 ENCOUNTER — Ambulatory Visit
Admission: RE | Admit: 2024-03-26 | Discharge: 2024-03-26 | Disposition: A | Discharge status: Home / Self Care | Source: Ambulatory Visit | Attending: Family Medicine | Admitting: Family Medicine

## 2024-03-26 DIAGNOSIS — Z1231 Encounter for screening mammogram for malignant neoplasm of breast: Secondary | ICD-10-CM | POA: Diagnosis not present

## 2024-03-31 DIAGNOSIS — K080 Exfoliation of teeth due to systemic causes: Secondary | ICD-10-CM | POA: Diagnosis not present

## 2024-04-15 ENCOUNTER — Other Ambulatory Visit: Payer: Self-pay | Admitting: Family Medicine

## 2024-04-15 MED ORDER — SOLIFENACIN SUCCINATE 10 MG PO TABS: 10.0000 mg | ORAL_TABLET | Freq: Every day | ORAL | 0 refills | Status: DC

## 2024-04-24 ENCOUNTER — Other Ambulatory Visit: Payer: Self-pay | Admitting: Family Medicine

## 2024-04-25 NOTE — Telephone Encounter (Signed)
 Requested medication (s) are due for refill today: Yes  Requested medication (s) are on the active medication list: Yes  Last refill:  03/24/24  Future visit scheduled: Yes  Notes to clinic:  Unable to refill per protocol due to failed labs, no updated results.      Requested Prescriptions  Pending Prescriptions Disp Refills   tolterodine  (DETROL  LA) 4 MG 24 hr capsule [Pharmacy Med Name: TOLTERODINE  TART ER 4 MG CAP] 30 capsule 0    Sig: TAKE 1 CAPSULE BY MOUTH DAILY     Urology:  Bladder Agents 2 Failed - 04/25/2024  6:06 PM      Failed - Cr in normal range and within 360 days    Creat  Date Value Ref Range Status  04/26/2023 0.81 0.60 - 1.00 mg/dL Final         Failed - ALT in normal range and within 360 days    ALT  Date Value Ref Range Status  04/26/2023 15 6 - 29 U/L Final         Failed - AST in normal range and within 360 days    AST  Date Value Ref Range Status  04/26/2023 21 10 - 35 U/L Final         Passed - Valid encounter within last 12 months    Recent Outpatient Visits           3 months ago Vertigo   [facility] Glenard Mire, MD   4 months ago Dizziness   [facility] Bernardo Fend, DO   5 months ago Well adult exam   [facility] Glenard Mire, MD       Future Appointments             In 1 month Sowles, Krichna, MD [facility], [place]   In 7 months Sowles, Krichna, MD [facility], [place]

## 2024-04-28 ENCOUNTER — Ambulatory Visit: Payer: Self-pay | Admitting: Family Medicine

## 2024-05-08 ENCOUNTER — Ambulatory Visit
Admission: RE | Admit: 2024-05-08 | Discharge: 2024-05-08 | Disposition: A | Discharge status: Home / Self Care | Source: Ambulatory Visit | Attending: Gastroenterology | Admitting: Gastroenterology

## 2024-05-08 DIAGNOSIS — R1310 Dysphagia, unspecified: Secondary | ICD-10-CM

## 2024-05-12 ENCOUNTER — Ambulatory Visit: Admitting: Family Medicine

## 2024-05-12 VITALS — BP 130/74 | HR 61 | Resp 16 | Ht 65.5 in | Wt 170.6 lb

## 2024-05-12 DIAGNOSIS — I10 Essential (primary) hypertension: Secondary | ICD-10-CM

## 2024-05-12 DIAGNOSIS — E039 Hypothyroidism, unspecified: Secondary | ICD-10-CM | POA: Diagnosis not present

## 2024-05-12 DIAGNOSIS — R42 Dizziness and giddiness: Secondary | ICD-10-CM | POA: Diagnosis not present

## 2024-05-12 DIAGNOSIS — R8290 Unspecified abnormal findings in urine: Secondary | ICD-10-CM

## 2024-05-12 DIAGNOSIS — E785 Hyperlipidemia, unspecified: Secondary | ICD-10-CM | POA: Diagnosis not present

## 2024-05-12 DIAGNOSIS — K219 Gastro-esophageal reflux disease without esophagitis: Secondary | ICD-10-CM

## 2024-05-12 DIAGNOSIS — M85852 Other specified disorders of bone density and structure, left thigh: Secondary | ICD-10-CM | POA: Diagnosis not present

## 2024-05-12 MED ORDER — LOSARTAN POTASSIUM 25 MG PO TABS: 25.0000 mg | ORAL_TABLET | Freq: Every day | ORAL | 1 refills | Status: AC

## 2024-05-12 MED ORDER — LEVOTHYROXINE SODIUM 88 MCG PO TABS: 88.0000 ug | ORAL_TABLET | Freq: Every day | ORAL | 1 refills | Status: AC

## 2024-05-12 MED ORDER — ROSUVASTATIN CALCIUM 20 MG PO TABS: 20.0000 mg | ORAL_TABLET | Freq: Every day | ORAL | 1 refills | Status: AC

## 2024-05-12 NOTE — Progress Notes (Signed)
 Name: Paige [name]   MRN: [id number]    DOB: [birth year]   Date:05/12/2024       Progress Note  Subjective  Chief Complaint  Chief Complaint  Patient presents with   Medical Management of Chronic Issues   Discussed the use of AI scribe software for clinical note transcription with the patient, who gave verbal consent to proceed.  History of Present Illness Paige [name] is a 79 year old female with GERD and vertigo who presents with dizziness and urinary issues.  She experiences dizziness, particularly when transitioning from lying down to sitting up, and describes feeling 'a little off'. She nearly fell recently and notes that the dizziness is not accompanied by vertigo, although she occasionally feels a slight spinning sensation when lying down. She completed vestibular rehabilitation in October or November and has not had vertigo since then, but the dizziness persists.  She has a history of GERD, diagnosed after a swallow test and imaging showed reflux up to the mid esophagus and esophageal motility issues. She was prescribed omeprazole 20 mg, which she has been taking for two days. Prior to this, she experienced a horrible taste in her mouth and food tasted terrible, which she associates with her previous bladder medication, solifenacin .  She is experiencing urinary frequency and a bad odor in her urine. She previously tried solifenacin  and another bladder medication, but discontinued them due to side effects, including a horrible taste in her mouth. She is not currently taking any bladder medication but wants to address the urinary issues.  She has  hypercholesterolemia, managed with rosuvastatin , and hypothyroidism, managed with levothyroxine . She also takes fish oil supplements. Her last thyroid  test was in October, and her B12 levels were checked at the same time. She has not had a regular panel since December 2024.  She is active in her community, leading a  seniors group that engages in various activities, including walking and dining out. She mentions gaining some weight, which she attributes to holiday eating and social activities. No chest pain or palpitations.    Patient Active Problem List   Diagnosis Date Noted   Urge incontinence 01/09/2024   RLS (restless legs syndrome) 11/26/2023   Trigger finger of right thumb 11/26/2023   OSA (obstructive sleep apnea) 02/05/2020   Essential hypertension 08/28/2018   Osteopenia 08/28/2018   Hyperlipidemia LDL goal <100 10/27/2014   Allergic rhinitis 10/27/2014   Adult hypothyroidism 10/13/2014    History reviewed. No pertinent surgical history.  Family History  Problem Relation Age of Onset   Cancer Mother    Diabetes Father    Stroke Father    Breast cancer Neg Hx     Social History   Tobacco Use   Smoking status: Former    Current packs/day: 0.00    Average packs/day: 0.3 packs/day for 5.0 years (1.3 ttl pk-yrs)    Types: Cigarettes    Start date: 05/01/1972    Quit date: 05/01/1977    Years since quitting: 47.0   Smokeless tobacco: Never  Substance Use Topics   Alcohol use: No    Current Medications[1]  Allergies[2]  I personally reviewed active problem list, medication list, allergies, family history with the patient/caregiver today.   ROS  Ten systems reviewed and is negative except as mentioned in HPI    Objective Physical Exam VITALS: BP- 130/74 CONSTITUTIONAL: Patient appears well-developed and well-nourished.  No distress. HEENT: Head atraumatic, normocephalic, neck supple. CARDIOVASCULAR: Normal rate, regular rhythm and  normal heart sounds.  No murmur heard. No BLE edema. PULMONARY: Effort normal and breath sounds normal. No respiratory distress. ABDOMINAL: There is no tenderness or distention. MUSCULOSKELETAL: Normal gait. Without gross motor or sensory deficit. PSYCHIATRIC: Patient has a normal mood and affect. behavior is normal. Judgment and thought  content normal.  Vitals:   05/12/24 1128  BP: 130/74  Pulse: 61  Resp: 16  SpO2: 97%  Weight: 170 lb 9.6 oz (77.4 kg)  Height: 5' 5.5 (1.664 m)    Body mass index is 27.96 kg/m.  Recent Results (from the past 2160 hours)  TSH     Status: None   Collection Time: 02/19/24  8:46 AM  Result Value Ref Range   TSH 0.62 0.40 - 4.50 mIU/L  Vitamin B12     Status: None   Collection Time: 02/19/24  8:49 AM  Result Value Ref Range   Vitamin B-12 560 200 - 1,100 pg/mL     PHQ2/9:    05/12/2024   11:23 AM 01/09/2024   10:12 AM 11/26/2023    8:45 AM 10/12/2023    8:18 AM 04/26/2023    9:12 AM  Depression screen PHQ 2/9  Decreased Interest 0 0 0 0 0  Down, Depressed, Hopeless 0 0 0 0 0  PHQ - 2 Score 0 0 0 0 0  Altered sleeping    0 0  Tired, decreased energy    0 0  Change in appetite    1 0  Feeling bad or failure about yourself     0 0  Trouble concentrating    0 0  Moving slowly or fidgety/restless    0 0  Suicidal thoughts    0 0  PHQ-9 Score    1  0   Difficult doing work/chores    Not difficult at all Not difficult at all     Data saved with a previous flowsheet row definition    phq 9 is negative  Fall Risk:    05/12/2024   11:23 AM 01/09/2024   10:12 AM 11/26/2023    8:45 AM 10/12/2023    8:15 AM 04/26/2023    9:07 AM  Fall Risk   Falls in the past year? 0 0 0 0 0  Number falls in past yr: 0 0 0 0 0  Injury with Fall? 0 0  0  0  0   Risk for fall due to : No Fall Risks No Fall Risks No Fall Risks No Fall Risks No Fall Risks  Follow up Falls evaluation completed Falls evaluation completed Falls prevention discussed Falls evaluation completed Falls prevention discussed;Education provided;Falls evaluation completed     Data saved with a previous flowsheet row definition      Assessment & Plan Gastroesophageal reflux disease GERD with reflux and altered taste, likely exacerbated by solifenacin . ENT confirmed GERD with esophageal motility issues. - Continue  omeprazole 20 mg daily for 30 days. - Go back to GI for endoscopy if symptoms persist.  Recurrent vertigo and dizziness Intermittent dizziness and near-fall episodes likely related to inner ear issues. Non-cardiac etiology suggested by controlled blood pressure and heart rate. - Advised follow-up with vestibular specialist for potential treatment.  Urinary frequency with abnormal urine findings Urinary frequency with abnormal odor and recent incontinence. Previous treatments not well-tolerated. - Ordered urinalysis and urine culture. - unable to tolerate Vesicare  or Detrol  due to anticholinergic effects - severe dry mouth  - Referred to urologist for further evaluation and management.  Essential hypertension Hypertension well-controlled with losartan . - Continue losartan  as prescribed.  Hyperlipidemia Managed with rosuvastatin . Previous lipid panel well-controlled. - Continue rosuvastatin  as prescribed. - Ordered lipid panel.  Hypothyroidism Well-managed with levothyroxine . Recent thyroid  function tests normal. - Continue levothyroxine  as prescribed.        [1]  Current Outpatient Medications:    aspirin 81 MG tablet, Take 1 tablet by mouth daily., Disp: , Rfl:    azelastine  (ASTELIN ) 0.1 % nasal spray, Place 2 sprays into both nostrils 2 (two) times daily. Use in each nostril as directed, Disp: 30 mL, Rfl: 2   Biotin 1 MG CAPS, Take 1 tablet by mouth daily., Disp: , Rfl:    Calcium -Phosphorus-Vitamin D  100-50-100 MG-MG-UNIT CHEW, Chew 1 tablet by mouth., Disp: , Rfl:    Coenzyme Q10 100 MG TABS, Take 100 mg by mouth daily., Disp: , Rfl:    Cranberry 125 MG TABS, Take 1 tablet by mouth daily., Disp: , Rfl:    cyanocobalamin 100 MCG tablet, Take 1,000 mcg by mouth daily. , Disp: , Rfl:    glucosamine-chondroitin 500-400 MG tablet, Take 1 tablet by mouth 3 (three) times daily., Disp: , Rfl:    levothyroxine  (SYNTHROID ) 88 MCG tablet, Take 1 tablet (88 mcg total) by mouth daily  before breakfast., Disp: 90 tablet, Rfl: 1   losartan  (COZAAR ) 25 MG tablet, Take 1 tablet (25 mg total) by mouth daily., Disp: 90 tablet, Rfl: 1   meclizine  (ANTIVERT ) 12.5 MG tablet, Take 1 tablet (12.5 mg total) by mouth 2 (two) times daily as needed for dizziness., Disp: 30 tablet, Rfl: 0   MULTIPLE VITAMIN PO, Take by mouth daily., Disp: , Rfl:    Multiple Vitamins-Minerals (HAIR SKIN AND NAILS FORMULA PO), Take 1 tablet by mouth daily., Disp: , Rfl:    Omega-3 Fatty Acids (FISH OIL BURP-LESS) 1200 MG CAPS, Take 1 capsule by mouth., Disp: , Rfl:    omeprazole (PRILOSEC) 20 MG capsule, Take 20 mg by mouth daily., Disp: , Rfl:    ondansetron  (ZOFRAN -ODT) 4 MG disintegrating tablet, Take 1 tablet (4 mg total) by mouth every 8 (eight) hours as needed for nausea or vomiting., Disp: 10 tablet, Rfl: 0   rosuvastatin  (CRESTOR ) 20 MG tablet, Take 1 tablet (20 mg total) by mouth daily., Disp: 90 tablet, Rfl: 1   tolterodine  (DETROL  LA) 4 MG 24 hr capsule, TAKE 1 CAPSULE BY MOUTH DAILY, Disp: 30 capsule, Rfl: 0 [2]  Allergies Allergen Reactions   Codeine Nausea And Vomiting

## 2024-05-13 ENCOUNTER — Ambulatory Visit: Payer: Self-pay | Admitting: Family Medicine

## 2024-05-14 LAB — URINALYSIS, COMPLETE
Bacteria, UA: NONE SEEN /HPF
Bilirubin Urine: NEGATIVE
Glucose, UA: NEGATIVE
Hgb urine dipstick: NEGATIVE
Hyaline Cast: NONE SEEN /LPF
Ketones, ur: NEGATIVE
Nitrite: NEGATIVE
Protein, ur: NEGATIVE
Specific Gravity, Urine: 1.015 (ref 1.001–1.035)
Squamous Epithelial / HPF: NONE SEEN /HPF
WBC, UA: NONE SEEN /HPF (ref 0–5)
pH: 7 (ref 5.0–8.0)

## 2024-05-14 LAB — CULTURE, URINE COMPREHENSIVE
MICRO NUMBER:: 17456177
SPECIMEN QUALITY:: ADEQUATE

## 2024-05-14 LAB — LIPID PANEL
Cholesterol: 134 mg/dL
HDL: 50 mg/dL
LDL Cholesterol (Calc): 65 mg/dL
Non-HDL Cholesterol (Calc): 84 mg/dL
Total CHOL/HDL Ratio: 2.7 (calc)
Triglycerides: 109 mg/dL

## 2024-05-14 LAB — CBC WITH DIFFERENTIAL/PLATELET
Absolute Lymphocytes: 1724 {cells}/uL (ref 850–3900)
Absolute Monocytes: 310 {cells}/uL (ref 200–950)
Basophils Absolute: 19 {cells}/uL (ref 0–200)
Basophils Relative: 0.3 %
Eosinophils Absolute: 81 {cells}/uL (ref 15–500)
Eosinophils Relative: 1.3 %
HCT: 41.2 % (ref 35.9–46.0)
Hemoglobin: 13.7 g/dL (ref 11.7–15.5)
MCH: 31.4 pg (ref 27.0–33.0)
MCHC: 33.3 g/dL (ref 31.6–35.4)
MCV: 94.3 fL (ref 81.4–101.7)
MPV: 10.9 fL (ref 7.5–12.5)
Monocytes Relative: 5 %
Neutro Abs: 4067 {cells}/uL (ref 1500–7800)
Neutrophils Relative %: 65.6 %
Platelets: 182 Thousand/uL (ref 140–400)
RBC: 4.37 Million/uL (ref 3.80–5.10)
RDW: 11.9 % (ref 11.0–15.0)
Total Lymphocyte: 27.8 %
WBC: 6.2 Thousand/uL (ref 3.8–10.8)

## 2024-05-14 LAB — COMPREHENSIVE METABOLIC PANEL WITH GFR
AG Ratio: 1.5 (calc) (ref 1.0–2.5)
ALT: 16 U/L (ref 6–29)
AST: 21 U/L (ref 10–35)
Albumin: 4.4 g/dL (ref 3.6–5.1)
Alkaline phosphatase (APISO): 50 U/L (ref 37–153)
BUN: 22 mg/dL (ref 7–25)
CO2: 28 mmol/L (ref 20–32)
Calcium: 9.3 mg/dL (ref 8.6–10.4)
Chloride: 106 mmol/L (ref 98–110)
Creat: 0.73 mg/dL (ref 0.60–1.00)
Globulin: 2.9 g/dL (ref 1.9–3.7)
Glucose, Bld: 91 mg/dL (ref 65–99)
Potassium: 4.2 mmol/L (ref 3.5–5.3)
Sodium: 141 mmol/L (ref 135–146)
Total Bilirubin: 0.6 mg/dL (ref 0.2–1.2)
Total Protein: 7.3 g/dL (ref 6.1–8.1)
eGFR: 85 mL/min/1.73m2

## 2024-05-14 LAB — VITAMIN D 25 HYDROXY (VIT D DEFICIENCY, FRACTURES): Vit D, 25-Hydroxy: 93 ng/mL (ref 30–100)

## 2024-05-21 ENCOUNTER — Other Ambulatory Visit: Payer: Self-pay | Admitting: Family Medicine

## 2024-05-21 DIAGNOSIS — E785 Hyperlipidemia, unspecified: Secondary | ICD-10-CM

## 2024-05-21 DIAGNOSIS — E039 Hypothyroidism, unspecified: Secondary | ICD-10-CM

## 2024-05-21 DIAGNOSIS — I10 Essential (primary) hypertension: Secondary | ICD-10-CM

## 2024-05-21 NOTE — Telephone Encounter (Signed)
 Duplicate request, refilled 05/12/24.  Requested Prescriptions  Pending Prescriptions Disp Refills   losartan  (COZAAR ) 25 MG tablet [Pharmacy Med Name: LOSARTAN  POTASSIUM 25 MG TAB] 90 tablet 1    Sig: TAKE 1 TABLET BY MOUTH DAILY     Cardiovascular:  Angiotensin Receptor Blockers Passed - 05/21/2024 12:05 PM      Passed - Cr in normal range and within 180 days    Creat  Date Value Ref Range Status  05/12/2024 0.73 0.60 - 1.00 mg/dL Final         Passed - K in normal range and within 180 days    Potassium  Date Value Ref Range Status  05/12/2024 4.2 3.5 - 5.3 mmol/L Final         Passed - Patient is not pregnant      Passed - Last BP in normal range    BP Readings from Last 1 Encounters:  05/12/24 130/74         Passed - Valid encounter within last 6 months    Recent Outpatient Visits           1 week ago GERD without esophagitis   [facility] Glenard Mire, MD   4 months ago Vertigo   [facility] Glenard Mire, MD   4 months ago Dizziness   [facility] Bernardo Fend, DO   5 months ago Well adult exam   [facility] Glenard Mire, MD       Future Appointments             In 6 months Glenard, Krichna, MD [facility], [place]             levothyroxine  (SYNTHROID ) 88 MCG tablet [Pharmacy Med Name: LEVOTHYROXINE  88 MCG TABLET] 90 tablet 1    Sig: TAKE 1 TABLET BY MOUTH DAILY BEFORE BREAKFAST     Endocrinology:  Hypothyroid Agents Passed - 05/21/2024 12:05 PM      Passed - TSH in normal range and within 360 days    TSH  Date Value Ref Range Status  02/19/2024 0.62 0.40 - 4.50 mIU/L Final         Passed - Valid encounter within last 12 months    Recent Outpatient Visits           1 week ago GERD without esophagitis   [facility] Glenard Mire, MD   4 months ago Vertigo   [facility] Glenard Mire, MD   4 months ago Dizziness   [facility] Bernardo Fend, DO   5 months ago Well adult exam   [facility] Glenard Mire, MD       Future Appointments             In 6 months Sowles, Krichna, MD [facility], [place]             rosuvastatin  (CRESTOR ) 20 MG tablet [Pharmacy Med Name: ROSUVASTATIN  CALCIUM  20 MG TAB] 90 tablet 1    Sig: TAKE 1 TABLET BY MOUTH DAILY     Cardiovascular:  Antilipid - Statins 2 Failed - 05/21/2024 12:05 PM      Failed - Lipid Panel in normal range within the last 12 months    Cholesterol, Total  Date Value Ref Range Status  09/07/2016 149 100 - 199 mg/dL Final   Cholesterol  Date Value Ref Range Status  05/12/2024 134 <200 mg/dL Final   LDL Cholesterol (Calc)  Date Value Ref Range Status  05/12/2024 65 mg/dL (calc) Final    Comment:    Reference range: <100 . Desirable range <100 mg/dL for primary prevention;   <70 mg/dL for patients with CHD or diabetic patients  with > or = 2 CHD risk factors. SABRA LDL-C is now calculated using the Martin-Hopkins  calculation, which is a validated novel method providing  better accuracy than the Friedewald equation in the  estimation of LDL-C.  Gladis APPLETHWAITE et al. SANDREA. 7986;689(80): 2061-2068  (http://education.[facility].com/faq/FAQ164)    HDL  Date Value Ref Range Status  05/12/2024 50 > OR = 50 mg/dL Final  94/89/7981 48 >60 mg/dL Final   Triglycerides  Date Value Ref Range Status  05/12/2024 109 <150 mg/dL Final         Passed - Cr in normal range and within 360 days    Creat  Date Value Ref Range Status  05/12/2024 0.73 0.60 - 1.00 mg/dL Final         Passed - Patient is not pregnant      Passed - Valid encounter within last 12 months    Recent Outpatient Visits           1 week ago GERD without esophagitis   [facility] Glenard Mire, MD   4 months ago Vertigo   [facility] Glenard Mire, MD   4 months ago Dizziness   [facility] Bernardo Fend, DO   5 months ago Well adult exam   [facility] Glenard Mire, MD       Future Appointments             In 6 months Glenard, Krichna, MD [facility], [place]

## 2024-05-27 ENCOUNTER — Other Ambulatory Visit: Payer: Self-pay | Admitting: Family Medicine

## 2024-05-27 DIAGNOSIS — E785 Hyperlipidemia, unspecified: Secondary | ICD-10-CM

## 2024-05-28 ENCOUNTER — Ambulatory Visit: Admitting: Family Medicine

## 2024-05-28 NOTE — Telephone Encounter (Signed)
 Rx was sent to Mail order pharmacy on 05/12/24 #90/1  Requested Prescriptions  Refused Prescriptions Disp Refills   rosuvastatin  (CRESTOR ) 20 MG tablet [Pharmacy Med Name: ROSUVASTATIN  CALCIUM  20 MG TAB] 90 tablet 1    Sig: TAKE 1 TABLET BY MOUTH DAILY     Cardiovascular:  Antilipid - Statins 2 Failed - 05/28/2024  2:16 PM      Failed - Lipid Panel in normal range within the last 12 months    Cholesterol, Total  Date Value Ref Range Status  09/07/2016 149 100 - 199 mg/dL Final   Cholesterol  Date Value Ref Range Status  05/12/2024 134 <200 mg/dL Final   LDL Cholesterol (Calc)  Date Value Ref Range Status  05/12/2024 65 mg/dL (calc) Final    Comment:    Reference range: <100 . Desirable range <100 mg/dL for primary prevention;   <70 mg/dL for patients with CHD or diabetic patients  with > or = 2 CHD risk factors. SABRA LDL-C is now calculated using the Martin-Hopkins  calculation, which is a validated novel method providing  better accuracy than the Friedewald equation in the  estimation of LDL-C.  Gladis APPLETHWAITE et al. SANDREA. 7986;689(80): 2061-2068  (http://education.[facility].com/faq/FAQ164)    HDL  Date Value Ref Range Status  05/12/2024 50 > OR = 50 mg/dL Final  94/89/7981 48 >60 mg/dL Final   Triglycerides  Date Value Ref Range Status  05/12/2024 109 <150 mg/dL Final         Passed - Cr in normal range and within 360 days    Creat  Date Value Ref Range Status  05/12/2024 0.73 0.60 - 1.00 mg/dL Final         Passed - Patient is not pregnant      Passed - Valid encounter within last 12 months    Recent Outpatient Visits           2 weeks ago GERD without esophagitis   [facility] Glenard Mire, MD   4 months ago Vertigo   [facility] Glenard Mire, MD   5 months ago Dizziness   [facility] Bernardo Fend, DO   6 months ago Well adult exam   [facility] Glenard Mire, MD       Future Appointments             In 6 months Glenard, Krichna, MD [facility], [place]

## 2024-05-30 ENCOUNTER — Other Ambulatory Visit: Payer: Self-pay | Admitting: Family Medicine

## 2024-05-30 DIAGNOSIS — I10 Essential (primary) hypertension: Secondary | ICD-10-CM

## 2024-06-03 ENCOUNTER — Telehealth: Payer: Self-pay | Admitting: Family Medicine

## 2024-10-17 ENCOUNTER — Ambulatory Visit

## 2024-12-10 ENCOUNTER — Encounter: Admitting: Family Medicine
